# Patient Record
Sex: Female | Born: 1985 | Race: Black or African American | Hispanic: No | Marital: Single | Smoking: Current every day smoker
Health system: Southern US, Community
[De-identification: ages and names within clinical notes are randomized; demographics above are authoritative.]

## PROBLEM LIST (undated history)

## (undated) ENCOUNTER — Inpatient Hospital Stay (HOSPITAL_COMMUNITY): Payer: Self-pay

## (undated) ENCOUNTER — Ambulatory Visit: Source: Home / Self Care

## (undated) DIAGNOSIS — Z789 Other specified health status: Secondary | ICD-10-CM

## (undated) DIAGNOSIS — O24419 Gestational diabetes mellitus in pregnancy, unspecified control: Secondary | ICD-10-CM

## (undated) HISTORY — PX: NO PAST SURGERIES: SHX2092

## (undated) HISTORY — DX: Gestational diabetes mellitus in pregnancy, unspecified control: O24.419

---
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
## 2007-06-11 ENCOUNTER — Emergency Department (HOSPITAL_COMMUNITY): Admission: EM | Admit: 2007-06-11 | Discharge: 2007-06-11 | Payer: Self-pay | Admitting: Emergency Medicine

## 2007-06-28 ENCOUNTER — Emergency Department (HOSPITAL_COMMUNITY): Admission: EM | Admit: 2007-06-28 | Discharge: 2007-06-28 | Payer: Self-pay | Admitting: Emergency Medicine

## 2007-07-13 ENCOUNTER — Inpatient Hospital Stay (HOSPITAL_COMMUNITY): Admission: AD | Admit: 2007-07-13 | Discharge: 2007-07-13 | Payer: Self-pay | Admitting: Obstetrics & Gynecology

## 2007-09-29 ENCOUNTER — Inpatient Hospital Stay (HOSPITAL_COMMUNITY): Admission: AD | Admit: 2007-09-29 | Discharge: 2007-09-30 | Payer: Self-pay | Admitting: Obstetrics

## 2007-10-25 ENCOUNTER — Inpatient Hospital Stay (HOSPITAL_COMMUNITY): Admission: AD | Admit: 2007-10-25 | Discharge: 2007-10-25 | Payer: Self-pay | Admitting: Obstetrics

## 2007-11-07 ENCOUNTER — Inpatient Hospital Stay (HOSPITAL_COMMUNITY): Admission: AD | Admit: 2007-11-07 | Discharge: 2007-11-07 | Payer: Self-pay | Admitting: Obstetrics

## 2007-12-15 ENCOUNTER — Inpatient Hospital Stay (HOSPITAL_COMMUNITY): Admission: AD | Admit: 2007-12-15 | Discharge: 2007-12-15 | Payer: Self-pay | Admitting: Obstetrics

## 2007-12-31 ENCOUNTER — Inpatient Hospital Stay (HOSPITAL_COMMUNITY): Admission: AD | Admit: 2007-12-31 | Discharge: 2007-12-31 | Payer: Self-pay | Admitting: Obstetrics

## 2008-01-09 ENCOUNTER — Inpatient Hospital Stay (HOSPITAL_COMMUNITY): Admission: AD | Admit: 2008-01-09 | Discharge: 2008-01-10 | Payer: Self-pay | Admitting: Obstetrics

## 2008-01-19 ENCOUNTER — Inpatient Hospital Stay (HOSPITAL_COMMUNITY): Admission: AD | Admit: 2008-01-19 | Discharge: 2008-01-21 | Payer: Self-pay | Admitting: Obstetrics

## 2008-04-26 ENCOUNTER — Emergency Department (HOSPITAL_COMMUNITY): Admission: EM | Admit: 2008-04-26 | Discharge: 2008-04-26 | Payer: Self-pay | Admitting: Emergency Medicine

## 2009-07-14 ENCOUNTER — Emergency Department (HOSPITAL_COMMUNITY): Admission: EM | Admit: 2009-07-14 | Discharge: 2009-07-14 | Payer: Self-pay | Admitting: Emergency Medicine

## 2009-08-18 ENCOUNTER — Emergency Department (HOSPITAL_COMMUNITY): Admission: EM | Admit: 2009-08-18 | Discharge: 2009-08-18 | Payer: Self-pay | Admitting: Family Medicine

## 2009-12-24 ENCOUNTER — Emergency Department (HOSPITAL_COMMUNITY): Admission: EM | Admit: 2009-12-24 | Discharge: 2009-12-24 | Payer: Self-pay | Admitting: Emergency Medicine

## 2010-07-17 ENCOUNTER — Emergency Department (HOSPITAL_COMMUNITY): Admission: EM | Admit: 2010-07-17 | Discharge: 2010-07-17 | Payer: Self-pay | Admitting: Emergency Medicine

## 2010-11-05 ENCOUNTER — Inpatient Hospital Stay (INDEPENDENT_AMBULATORY_CARE_PROVIDER_SITE_OTHER)
Admission: RE | Admit: 2010-11-05 | Discharge: 2010-11-05 | Disposition: A | Discharge status: Home / Self Care | Payer: Self-pay | Source: Ambulatory Visit | Attending: Family Medicine | Admitting: Family Medicine

## 2010-11-05 DIAGNOSIS — K089 Disorder of teeth and supporting structures, unspecified: Secondary | ICD-10-CM

## 2010-12-11 LAB — GC/CHLAMYDIA PROBE AMP, GENITAL: GC Probe Amp, Genital: NEGATIVE

## 2010-12-11 LAB — POCT URINALYSIS DIP (DEVICE): Nitrite: NEGATIVE

## 2011-01-12 ENCOUNTER — Inpatient Hospital Stay (INDEPENDENT_AMBULATORY_CARE_PROVIDER_SITE_OTHER)
Admission: RE | Admit: 2011-01-12 | Discharge: 2011-01-12 | Disposition: A | Discharge status: Home / Self Care | Payer: Self-pay | Source: Ambulatory Visit | Attending: Family Medicine | Admitting: Family Medicine

## 2011-01-12 DIAGNOSIS — N760 Acute vaginitis: Secondary | ICD-10-CM

## 2011-01-12 LAB — WET PREP, GENITAL
Clue Cells Wet Prep HPF POC: NONE SEEN
Trich, Wet Prep: NONE SEEN
Yeast Wet Prep HPF POC: NONE SEEN

## 2011-01-15 LAB — GC/CHLAMYDIA PROBE AMP, GENITAL
Chlamydia, DNA Probe: NEGATIVE
GC Probe Amp, Genital: NEGATIVE

## 2011-02-17 ENCOUNTER — Inpatient Hospital Stay (INDEPENDENT_AMBULATORY_CARE_PROVIDER_SITE_OTHER)
Admission: RE | Admit: 2011-02-17 | Discharge: 2011-02-17 | Disposition: A | Discharge status: Home / Self Care | Payer: Medicaid Other | Source: Ambulatory Visit | Attending: Family Medicine | Admitting: Family Medicine

## 2011-02-17 DIAGNOSIS — IMO0002 Reserved for concepts with insufficient information to code with codable children: Secondary | ICD-10-CM

## 2011-05-30 LAB — URINALYSIS, ROUTINE W REFLEX MICROSCOPIC
Hgb urine dipstick: NEGATIVE
Specific Gravity, Urine: 1.02
pH: 6.5

## 2011-05-31 LAB — URINALYSIS, ROUTINE W REFLEX MICROSCOPIC
Bilirubin Urine: NEGATIVE
Hgb urine dipstick: NEGATIVE
Specific Gravity, Urine: <1.005 — ABNORMAL LOW

## 2011-06-04 LAB — URINALYSIS, ROUTINE W REFLEX MICROSCOPIC
Bilirubin Urine: NEGATIVE
Hgb urine dipstick: NEGATIVE
Nitrite: NEGATIVE

## 2011-06-04 LAB — CBC
HCT: 35.4 — ABNORMAL LOW
MCV: 82.8
Platelets: 250
RBC: 4.28
WBC: 13.4 — ABNORMAL HIGH

## 2011-06-04 LAB — URIC ACID: Uric Acid, Serum: 2.6

## 2011-06-04 LAB — COMPREHENSIVE METABOLIC PANEL
AST: 13
Chloride: 105
GFR calc Af Amer: >60
GFR calc non Af Amer: >60
Glucose, Bld: 95
Total Protein: 6.5

## 2011-06-18 LAB — URINALYSIS, ROUTINE W REFLEX MICROSCOPIC
Bilirubin Urine: NEGATIVE
Glucose, UA: NEGATIVE
Ketones, ur: NEGATIVE
Nitrite: NEGATIVE
Protein, ur: NEGATIVE

## 2011-06-18 LAB — URINE MICROSCOPIC-ADD ON: WBC, UA: NONE SEEN

## 2011-06-18 LAB — DIFFERENTIAL
Basophils Relative: 0
Lymphocytes Relative: 17
Monocytes Relative: 4
Neutro Abs: 11.1 — ABNORMAL HIGH
Neutrophils Relative %: 76

## 2011-06-18 LAB — CBC
HCT: 35 — ABNORMAL LOW
Platelets: 297
WBC: 14.6 — ABNORMAL HIGH

## 2011-06-18 LAB — GC/CHLAMYDIA PROBE AMP, GENITAL: GC Probe Amp, Genital: NEGATIVE

## 2011-06-18 LAB — WET PREP, GENITAL: Trich, Wet Prep: NONE SEEN

## 2011-06-19 LAB — RPR: RPR Ser Ql: NONREACTIVE

## 2011-06-19 LAB — GC/CHLAMYDIA PROBE AMP, GENITAL: GC Probe Amp, Genital: NEGATIVE

## 2011-06-19 LAB — ABO/RH: ABO/RH(D): A POS

## 2011-11-05 ENCOUNTER — Other Ambulatory Visit: Payer: Self-pay | Admitting: Obstetrics

## 2011-11-19 ENCOUNTER — Encounter (HOSPITAL_COMMUNITY): Payer: Self-pay | Admitting: Emergency Medicine

## 2011-11-19 ENCOUNTER — Emergency Department (HOSPITAL_COMMUNITY)
Admission: EM | Admit: 2011-11-19 | Discharge: 2011-11-19 | Disposition: A | Discharge status: Home / Self Care | Payer: Medicaid Other | Attending: Emergency Medicine | Admitting: Emergency Medicine

## 2011-11-19 DIAGNOSIS — J069 Acute upper respiratory infection, unspecified: Secondary | ICD-10-CM | POA: Insufficient documentation

## 2011-11-19 DIAGNOSIS — R0981 Nasal congestion: Secondary | ICD-10-CM

## 2011-11-19 MED ORDER — CETIRIZINE-PSEUDOEPHEDRINE ER 5-120 MG PO TB12: 1.0000 | ORAL_TABLET | Freq: Every day | ORAL | Status: DC

## 2011-11-19 MED ORDER — OXYMETAZOLINE HCL 0.05 % NA SOLN
1.0000 | Freq: Once | NASAL | Status: AC
  Administered 2011-11-19: 1 via NASAL
  Filled 2011-11-19: qty 15

## 2011-11-19 NOTE — ED Notes (Signed)
Patient c/o nasal congestion x 1 week, patient states non productive cough.  Patient c/o headache and states her head feels full of congestion and tightness.  Patient states seasonal allergies and has been taking claritin and otc cold medicine for symptoms.

## 2011-11-19 NOTE — ED Notes (Signed)
PT. REPORTS NASAL CONGESTION / RUNNY NOSE FOR 1 WEEK.

## 2011-11-19 NOTE — Discharge Instructions (Signed)
You were seen and evaluated today for your complaints of nasal congestion and headache. At this time your providers feel your symptoms are caused from any concerning infections. You were given a nasal decongestant spray to use to help with your symptoms. Please use this as instructed for no more than 3 days. You've also been given a prescription for additional medications to help with your congestion and runny nose. Please drink plenty of water to stay hydrated and get lots of rest for your body to recover.  Upper Respiratory Infection, Adult An upper respiratory infection (URI) is also known as the common cold. It is often caused by a type of germ (virus). Colds are easily spread (contagious). You can pass it to others by kissing, coughing, sneezing, or drinking out of the same glass. Usually, you get better in 1 or 2 weeks.  HOME CARE   Only take medicine as told by your doctor.   Use a warm mist humidifier or breathe in steam from a hot shower.   Drink enough water and fluids to keep your pee (urine) clear or pale yellow.   Get plenty of rest.   Return to work when your temperature is back to normal or as told by your doctor. You may use a face mask and wash your hands to stop your cold from spreading.  GET HELP RIGHT AWAY IF:   After the first few days, you feel you are getting worse.   You have questions about your medicine.   You have chills, shortness of breath, or brown or red spit (mucus).   You have yellow or brown snot (nasal discharge) or pain in the face, especially when you bend forward.   You have a fever, puffy (swollen) neck, pain when you swallow, or white spots in the back of your throat.   You have a bad headache, ear pain, sinus pain, or chest pain.   You have a high-pitched whistling sound when you breathe in and out (wheezing).   You have a lasting cough or cough up blood.   You have sore muscles or a stiff neck.  MAKE SURE YOU:   Understand these  instructions.   Will watch your condition.   Will get help right away if you are not doing well or get worse.  Document Released: 02/12/2008 Document Revised: 08/15/2011 Document Reviewed: 12/31/2010 [facility] Patient Information 2012 [place], [place].

## 2011-11-19 NOTE — ED Provider Notes (Signed)
History     CSN: [id number]  Arrival date & time 11/19/11  1910   First MD Initiated Contact with Patient 11/19/11 2054      Chief Complaint  Patient presents with  . Nasal Congestion     HPI  History provided by the patient. Patient is a 26 year old female with no significant past medical history who presents with complaints of nasal congestion and rhinorrhea for the past one week. Patient reports also having sinus pressure with headache did become worse today. She's been using over-the-counter Tylenol Sinus without significant improvement. She is not report any other significant symptoms. She denies cough, sore throat, nausea vomiting or diarrhea. She also denies fever chills or sweats. She does not know of any sick contacts. Symptoms are described as moderate. There are no other aggravating or alleviating factors.    History reviewed. No pertinent past medical history.  History reviewed. No pertinent past surgical history.  No family history on file.  History  Substance Use Topics  . Smoking status: Never Smoker   . Smokeless tobacco: Not on file  . Alcohol Use: No    OB History    Grav Para Term Preterm Abortions TAB SAB Ect Mult Living                  Review of Systems  Constitutional: Positive for fatigue. Negative for fever, chills and appetite change.  HENT: Positive for congestion, sore throat, rhinorrhea and sinus pressure. Negative for ear pain.   Respiratory: Negative for cough.   Gastrointestinal: Negative for nausea, vomiting, abdominal pain and diarrhea.  All other systems reviewed and are negative.    Allergies  Review of patient's allergies indicates no known allergies.  Home Medications  No current outpatient prescriptions on file.  BP 126/77  Pulse 95  Temp(Src) 98.2 F (36.8 C) (Oral)  Resp 16  SpO2 100%  Physical Exam  Nursing note and vitals reviewed. Constitutional: She is oriented to person, place, and time. She appears  well-developed and well-nourished. No distress.  HENT:  Head: Normocephalic.  Right Ear: Tympanic membrane normal.  Left Ear: Tympanic membrane normal.  Mouth/Throat: Oropharynx is clear and moist.       Nostrils with edema R>L,  Poor air movement through rt nostril.  Pressure and tenderness over maxillary sinuses.  Neck: Normal range of motion. Neck supple.       No meningeal signs.  Cardiovascular: Normal rate and regular rhythm.   Pulmonary/Chest: Effort normal and breath sounds normal. No respiratory distress. She has no wheezes. She has no rales.  Abdominal: Soft. She exhibits no distension. There is no tenderness. There is no rebound and no guarding.  Lymphadenopathy:    She has no cervical adenopathy.  Neurological: She is alert and oriented to person, place, and time.  Skin: Skin is warm and dry. No rash noted.  Psychiatric: She has a normal mood and affect. Her behavior is normal.    ED Course  Procedures    1. URI (upper respiratory infection)   2. Nasal congestion       MDM  9:40 PM patient seen and evaluated. Patient no acute distress.        Angus Seller, [place] 11/21/11 [phone]

## 2011-11-21 NOTE — ED Provider Notes (Signed)
History/physical exam/procedure(s) were performed by non-physician practitioner and as supervising physician I was immediately available for consultation/collaboration. I have reviewed all notes and am in agreement with care and plan.   Torrin Frein S Christine Schiefelbein, MD 11/21/11 1304 

## 2012-05-27 ENCOUNTER — Encounter (HOSPITAL_COMMUNITY): Payer: Self-pay

## 2012-05-27 ENCOUNTER — Emergency Department (INDEPENDENT_AMBULATORY_CARE_PROVIDER_SITE_OTHER)
Admission: EM | Admit: 2012-05-27 | Discharge: 2012-05-27 | Disposition: A | Discharge status: Home / Self Care | Payer: Self-pay | Source: Home / Self Care | Attending: Family Medicine | Admitting: Family Medicine

## 2012-05-27 DIAGNOSIS — N739 Female pelvic inflammatory disease, unspecified: Secondary | ICD-10-CM

## 2012-05-27 LAB — POCT URINALYSIS DIP (DEVICE)
Bilirubin Urine: NEGATIVE
Glucose, UA: NEGATIVE mg/dL
Ketones, ur: NEGATIVE mg/dL
Leukocytes, UA: NEGATIVE
Nitrite: NEGATIVE
pH: 6 (ref 5.0–8.0)

## 2012-05-27 LAB — WET PREP, GENITAL: Yeast Wet Prep HPF POC: NONE SEEN

## 2012-05-27 MED ORDER — CEFTRIAXONE SODIUM 1 G IJ SOLR
1.0000 g | Freq: Once | INTRAMUSCULAR | Status: AC
  Administered 2012-05-27: 1 g via INTRAMUSCULAR

## 2012-05-27 MED ORDER — AZITHROMYCIN 250 MG PO TABS
1000.0000 mg | ORAL_TABLET | Freq: Once | ORAL | Status: AC
  Administered 2012-05-27: 1000 mg via ORAL

## 2012-05-27 MED ORDER — METRONIDAZOLE 500 MG PO TABS: 500.0000 mg | ORAL_TABLET | Freq: Two times a day (BID) | ORAL | Status: DC

## 2012-05-27 MED ORDER — AZITHROMYCIN 250 MG PO TABS
ORAL_TABLET | ORAL | Status: AC
  Filled 2012-05-27: qty 4

## 2012-05-27 MED ORDER — LIDOCAINE HCL (PF) 1 % IJ SOLN
INTRAMUSCULAR | Status: AC
  Filled 2012-05-27: qty 5

## 2012-05-27 MED ORDER — CEFTRIAXONE SODIUM 250 MG IJ SOLR
INTRAMUSCULAR | Status: AC
  Filled 2012-05-27: qty 250

## 2012-05-27 NOTE — ED Provider Notes (Signed)
History     CSN: [id number]  Arrival date & time 05/27/12  1803   First MD Initiated Contact with Patient 05/27/12 1829      Chief Complaint  Patient presents with  . Abdominal Pain    (Consider location/radiation/quality/duration/timing/severity/associated sxs/prior treatment) Patient is a 26 y.o. female presenting with vaginal discharge. The history is provided by the patient.  Vaginal Discharge This is a new problem. The current episode started 2 days ago. The problem has been gradually worsening. Associated symptoms include abdominal pain.    History reviewed. No pertinent past medical history.  History reviewed. No pertinent past surgical history.  History reviewed. No pertinent family history.  History  Substance Use Topics  . Smoking status: Never Smoker   . Smokeless tobacco: Not on file  . Alcohol Use: No    OB History    Grav Para Term Preterm Abortions TAB SAB Ect Mult Living                  Review of Systems  Constitutional: Negative.   Gastrointestinal: Positive for abdominal pain. Negative for nausea, vomiting, diarrhea and constipation.  Genitourinary: Positive for vaginal discharge. Negative for dysuria, urgency, vaginal bleeding, vaginal pain and dyspareunia.    Allergies  Review of patient's allergies indicates no known allergies.  Home Medications   Current Outpatient Rx  Name Route Sig Dispense Refill  . CETIRIZINE-PSEUDOEPHEDRINE ER 5-120 MG PO TB12 Oral Take 1 tablet by mouth daily. 30 tablet 0  . METRONIDAZOLE 500 MG PO TABS Oral Take 1 tablet (500 mg total) by mouth 2 (two) times daily. 14 tablet 0    BP 137/79  Pulse 101  Temp 97.9 F (36.6 C) (Oral)  Resp 18  SpO2 97%  Physical Exam  Nursing note and vitals reviewed. Constitutional: She appears well-developed and well-nourished.  Abdominal: Soft. Bowel sounds are normal. She exhibits no distension and no mass. There is no hepatosplenomegaly. There is tenderness in the right  lower quadrant and suprapubic area. There is no rigidity, no rebound, no guarding, no CVA tenderness, no tenderness at McBurney's point and negative Murphy's sign.  Genitourinary: Uterus is tender. Cervix exhibits motion tenderness and discharge. Right adnexum displays tenderness. Vaginal discharge found.    ED Course  Procedures (including critical care time)  Labs Reviewed  POCT URINALYSIS DIP (DEVICE) - Abnormal; Notable for the following:    Hgb urine dipstick SMALL (*)     Protein, ur 30 (*)     Urobilinogen, UA 4.0 (*)     All other components within normal limits  POCT PREGNANCY, URINE  GC/CHLAMYDIA PROBE AMP, GENITAL  WET PREP, GENITAL   No results found.   1. PID (pelvic inflammatory disease)       MDM          Linna Hoff, MD 05/27/12 [phone]

## 2012-05-27 NOTE — ED Notes (Signed)
Reports lower abdominal area pain, vaginal d/c x 2 days; NAD; overdue for depo x 2 months

## 2012-05-27 NOTE — ED Notes (Signed)
Mediation compliance reviewed ; call back number for lab issues verified

## 2012-05-28 LAB — GC/CHLAMYDIA PROBE AMP, GENITAL
Chlamydia, DNA Probe: NEGATIVE
GC Probe Amp, Genital: POSITIVE — AB

## 2012-05-29 ENCOUNTER — Telehealth (HOSPITAL_COMMUNITY): Payer: Self-pay | Admitting: *Deleted

## 2012-05-29 NOTE — ED Notes (Signed)
Pt. called for her lab results.  Pt. verified x 2 and given results.  (GC pos., Chlamydia neg., Wet prep: WBC's TNTC) Pt. told she was adequately treated with Rocephin and Zithromax.  Pt. instructed to notify her partner, no sex for 1 week and to practice safe sex. Pt. told she can get HIV testing at the [facility]. STD clinic, by appointment. Vassie Moselle 05/29/2012

## 2012-06-29 ENCOUNTER — Emergency Department (HOSPITAL_COMMUNITY)
Admission: EM | Admit: 2012-06-29 | Discharge: 2012-06-29 | Disposition: A | Discharge status: Home / Self Care | Payer: Medicaid Other | Attending: Emergency Medicine | Admitting: Emergency Medicine

## 2012-06-29 ENCOUNTER — Encounter (HOSPITAL_COMMUNITY): Payer: Self-pay

## 2012-06-29 DIAGNOSIS — R21 Rash and other nonspecific skin eruption: Secondary | ICD-10-CM | POA: Insufficient documentation

## 2012-06-29 DIAGNOSIS — F17200 Nicotine dependence, unspecified, uncomplicated: Secondary | ICD-10-CM | POA: Insufficient documentation

## 2012-06-29 MED ORDER — PERMETHRIN 5 % EX CREA: TOPICAL_CREAM | CUTANEOUS | Status: DC

## 2012-06-29 NOTE — ED Provider Notes (Signed)
History   Scribed for Gerhard Munch, MD, the patient was seen in room TR11C/TR11C . This chart was scribed by Lewanda Rife.   CSN: [id number]  Arrival date & time 06/29/12  1706   First MD Initiated Contact with Patient 06/29/12 1827      Chief Complaint  Patient presents with  . Rash    (Consider location/radiation/quality/duration/timing/severity/associated sxs/prior treatment) HPI Lorraine Nguyen is a 26 y.o. female who presents to the Emergency Department complaining of a moderately pruritic rash on arms, back and legs for the past 3 weeks. Pt describes her symptoms as constant and worse at night. Pt reports her 105 year old son might have been exposed to scabies at day care. Pt denies chest pain, fever, vomiting, and confusion. Pt reports using hydrocortisone with mild relief of symptoms. Pt denies any significant past medical history.     History reviewed. No pertinent past medical history.  History reviewed. No pertinent past surgical history.  History reviewed. No pertinent family history.  History  Substance Use Topics  . Smoking status: Current Every Day Smoker  . Smokeless tobacco: Not on file  . Alcohol Use: Yes    OB History    Grav Para Term Preterm Abortions TAB SAB Ect Mult Living                  Review of Systems  Constitutional: Negative.   HENT: Negative.   Respiratory: Negative.   Cardiovascular: Negative.   Gastrointestinal: Negative.   Musculoskeletal: Negative.   Skin: Positive for rash.  Neurological: Negative.   Hematological: Negative.   Psychiatric/Behavioral: Negative.     Allergies  Review of patient's allergies indicates no known allergies.  Home Medications  No current outpatient prescriptions on file.  BP 125/81  Pulse 97  Temp 97.5 F (36.4 C) (Oral)  Resp 18  SpO2 99%  Physical Exam  Nursing note and vitals reviewed. Constitutional: She is oriented to person, place, and time. She appears well-developed  and well-nourished. No distress.  HENT:  Head: Normocephalic and atraumatic.       No mucosal involvement  Eyes: Conjunctivae normal and EOM are normal.  Pulmonary/Chest: No stridor. No respiratory distress.  Abdominal: She exhibits no distension.  Musculoskeletal: She exhibits no edema.  Neurological: She is alert and oriented to person, place, and time. No cranial nerve deficit.  Skin: Skin is warm and dry. Rash noted.       Diffuse widely spaced raised patches of rashes No erythema    Psychiatric: She has a normal mood and affect.    ED Course  Procedures (including critical care time)  Labs Reviewed - No data to display No results found.   No diagnosis found.    MDM  I personally performed the services described in this documentation, which was scribed in my presence. The recorded information has been reviewed and considered.  This young female presents to the emergency department accompanied by her son who has similar lesions.  On my exams he is in no distress.  Given the presence of lesions on multiple family members there is suspicion of a vector based etiology, such as mites or fleas.  The absence of distress, fever, tachycardia, and the patient's otherwise generally benign presentation areeassuring.  I discussed all pertinent findings with the patient.  She was discharged in stable condition.  Gerhard Munch, MD 06/30/12 [phone]

## 2012-06-29 NOTE — ED Notes (Signed)
Pt with c/o rash on entire body for approx 3 weeks

## 2013-01-25 ENCOUNTER — Emergency Department (INDEPENDENT_AMBULATORY_CARE_PROVIDER_SITE_OTHER)
Admission: EM | Admit: 2013-01-25 | Discharge: 2013-01-25 | Disposition: A | Discharge status: Home / Self Care | Payer: Medicaid Other | Source: Home / Self Care

## 2013-01-25 ENCOUNTER — Encounter (HOSPITAL_COMMUNITY): Payer: Self-pay | Admitting: Emergency Medicine

## 2013-01-25 DIAGNOSIS — B356 Tinea cruris: Secondary | ICD-10-CM

## 2013-01-25 MED ORDER — CLOTRIMAZOLE-BETAMETHASONE 1-0.05 % EX CREA: TOPICAL_CREAM | CUTANEOUS | Status: DC

## 2013-01-25 NOTE — ED Provider Notes (Signed)
History     CSN: [id number]  Arrival date & time 01/25/13  1132   First MD Initiated Contact with Patient 01/25/13 1230      Chief Complaint  Patient presents with  . Rash    (Consider location/radiation/quality/duration/timing/severity/associated sxs/prior treatment) HPI Comments: 27 year old obese female complaining of a rash for 3 weeks or longer. The itching rashes located in the inguinal folds. This is been coming and going over the past few months. It got worse after applying a feminine spray   History reviewed. No pertinent past medical history.  History reviewed. No pertinent past surgical history.  No family history on file.  History  Substance Use Topics  . Smoking status: Never Smoker   . Smokeless tobacco: Not on file  . Alcohol Use: No    OB History   Grav Para Term Preterm Abortions TAB SAB Ect Mult Living                  Review of Systems  Skin: Positive for rash.       As above  All other systems reviewed and are negative.    Allergies  Review of patient's allergies indicates no known allergies.  Home Medications   Current Outpatient Rx  Name  Route  Sig  Dispense  Refill  . hydrocortisone cream 1 %   Topical   Apply topically 2 (two) times daily.         . clotrimazole-betamethasone (LOTRISONE) cream      Apply to affected area 2 times daily prn   30 g   0   . permethrin (ELIMITE) 5 % cream      Apply to affected area once   60 g   0     BP 135/75  Pulse 94  Temp(Src) 98.4 F (36.9 C) (Oral)  Resp 14  SpO2 100%  Physical Exam  Nursing note and vitals reviewed. Constitutional: She is oriented to person, place, and time. She appears well-developed and well-nourished. No distress.  Eyes: EOM are normal.  Neck: Normal range of motion. Neck supple.  Pulmonary/Chest: Effort normal. No respiratory distress.  Musculoskeletal: She exhibits no edema and no tenderness.  Neurological: She is alert and oriented to person,  place, and time. She exhibits normal muscle tone.  Skin: Skin is warm and dry. Rash noted.  Well marginated slightly brownish to red , scaly rash along the proximal medial thighs, inguinal folds    ED Course  Procedures (including critical care time)  Labs Reviewed - No data to display No results found.   1. Tinea cruris       MDM  Lotrisone cream apply twice a day to affected area. Continue to apply the cream 5 days after it appears to have abated. If you run out he may purchase Lamisil or Lotrimin AF cream's to apply to the area to kill the fungus. Keep area dry        Hayden Rasmussen, NP 01/25/13 1247

## 2013-01-25 NOTE — ED Provider Notes (Signed)
Medical screening examination/treatment/procedure(s) were performed by non-physician practitioner and as supervising physician I was immediately available for consultation/collaboration.   MORENO-COLL,Kenya Kook; MD  Kathyann Spaugh Moreno-Coll, MD 01/25/13 1658 

## 2013-01-25 NOTE — ED Notes (Signed)
Rash in groin area, no histroy of the same, rash has been present for 3 weeks.  Reports rash itches, but has not worsened over the time frame

## 2013-08-02 ENCOUNTER — Emergency Department (INDEPENDENT_AMBULATORY_CARE_PROVIDER_SITE_OTHER)
Admission: EM | Admit: 2013-08-02 | Discharge: 2013-08-02 | Disposition: A | Discharge status: Home / Self Care | Payer: Medicaid Other | Source: Home / Self Care | Attending: Family Medicine | Admitting: Family Medicine

## 2013-08-02 ENCOUNTER — Encounter (HOSPITAL_COMMUNITY): Payer: Self-pay | Admitting: Emergency Medicine

## 2013-08-02 DIAGNOSIS — M62830 Muscle spasm of back: Secondary | ICD-10-CM

## 2013-08-02 DIAGNOSIS — M549 Dorsalgia, unspecified: Secondary | ICD-10-CM

## 2013-08-02 DIAGNOSIS — K047 Periapical abscess without sinus: Secondary | ICD-10-CM

## 2013-08-02 DIAGNOSIS — K044 Acute apical periodontitis of pulpal origin: Secondary | ICD-10-CM

## 2013-08-02 DIAGNOSIS — M5380 Other specified dorsopathies, site unspecified: Secondary | ICD-10-CM

## 2013-08-02 MED ORDER — TRAMADOL HCL 50 MG PO TABS: 50.0000 mg | ORAL_TABLET | Freq: Four times a day (QID) | ORAL | Status: DC | PRN

## 2013-08-02 MED ORDER — CLINDAMYCIN HCL 300 MG PO CAPS: 300.0000 mg | ORAL_CAPSULE | Freq: Three times a day (TID) | ORAL | Status: DC

## 2013-08-02 MED ORDER — CYCLOBENZAPRINE HCL 5 MG PO TABS: 5.0000 mg | ORAL_TABLET | Freq: Three times a day (TID) | ORAL | Status: DC | PRN

## 2013-08-02 NOTE — ED Notes (Signed)
Patient has multiple complaints.  Reports mid back pain onset 2 weeks ago, no known injury.  Toothache for 4 days.  Pain upper right, soreness under jaw

## 2013-08-02 NOTE — ED Provider Notes (Signed)
Medical screening examination/treatment/procedure(s) were performed by a resident physician or non-physician practitioner and as the supervising physician I was immediately available for consultation/collaboration.  Mickle Campton, MD      Jocob Dambach S Sangita Zani, MD 08/02/13 2113 

## 2013-08-02 NOTE — ED Notes (Signed)
Patient being seen with child, child is a patient , too

## 2013-08-02 NOTE — ED Provider Notes (Signed)
CSN: [id number]     Arrival date & time 08/02/13  1617 History   First MD Initiated Contact with Patient 08/02/13 1636     Chief Complaint  Patient presents with  . Dental Pain  . Back Pain   (Consider location/radiation/quality/duration/timing/severity/associated sxs/prior Treatment) Patient is a 27 y.o. female presenting with tooth pain and back pain.  Dental Pain Associated symptoms: no fever   Back Pain Associated symptoms: no chest pain and no fever     Dental pain: off an on. Unable to get it pulled due to insurance. Current episode started 2 days ago. Has not tried anytihg other than warm water. Tylenol w/o benefit. Gum swelling. Improving. Located in R upper an lower jaw. Reports purulent and bloody discharge. Associated w/ HA. Denies fevers, n/v. Reports numerous dental cavities  Back pain: started 3 wks ago. Uses girdle w/ benefit. Located in upper back. Slow onset. No radiation. H/o low back pain. Difficulty getting out of bed in morning. Denies dysuria, abd pain, loss of bowel or bladder function or falls. H/o fall down stairs about 1 year ago. Decrease in daily exercise over last month.   History reviewed. No pertinent past medical history. History reviewed. No pertinent past surgical history. No family history on file. History  Substance Use Topics  . Smoking status: Never Smoker   . Smokeless tobacco: Not on file  . Alcohol Use: No   OB History   Grav Para Term Preterm Abortions TAB SAB Ect Mult Living                 Review of Systems  Constitutional: Negative for fever, activity change, appetite change and unexpected weight change.  Respiratory: Negative for shortness of breath.   Cardiovascular: Negative for chest pain.  Musculoskeletal: Positive for back pain.  All other systems reviewed and are negative.    Allergies  Review of patient's allergies indicates no known allergies.  Home Medications   Current Outpatient Rx  Name  Route  Sig  Dispense   Refill  . clindamycin (CLEOCIN) 300 MG capsule   Oral   Take 1 capsule (300 mg total) by mouth 3 (three) times daily.   30 capsule   0   . clotrimazole-betamethasone (LOTRISONE) cream      Apply to affected area 2 times daily prn   30 g   0   . cyclobenzaprine (FLEXERIL) 5 MG tablet   Oral   Take 1 tablet (5 mg total) by mouth 3 (three) times daily as needed for muscle spasms.   30 tablet   0   . hydrocortisone cream 1 %   Topical   Apply topically 2 (two) times daily.         . permethrin (ELIMITE) 5 % cream      Apply to affected area once   60 g   0   . traMADol (ULTRAM) 50 MG tablet   Oral   Take 1 tablet (50 mg total) by mouth every 6 (six) hours as needed.   15 tablet   0    BP 115/80  Pulse 70  Temp(Src) 98.9 F (37.2 C) (Oral)  Resp 16  SpO2 100% Physical Exam  Constitutional: She is oriented to person, place, and time. She appears well-developed and well-nourished. No distress.  HENT:  Head: Normocephalic and atraumatic.  Numerous dental carries and teeth in varying degrees of decay. Foul odor w/ perigingival erythema. No obvious discharge.   Eyes: Pupils are equal, round, and  reactive to light.  Neck: Normal range of motion.  Cardiovascular: Normal rate and normal heart sounds.   Pulmonary/Chest: Effort normal and breath sounds normal.  Abdominal: Soft. Bowel sounds are normal.  Musculoskeletal: Normal range of motion.  Mild perispinal mid thoracic muscle tightness and ttp.   Neurological: She is alert and oriented to person, place, and time.  Skin: Skin is warm.  Psychiatric: She has a normal mood and affect. Her behavior is normal. Judgment and thought content normal.    ED Course  Procedures (including critical care time) Labs Review Labs Reviewed - No data to display Imaging Review No results found.  EKG Interpretation    Date/Time:    Ventricular Rate:    PR Interval:    QRS Duration:   QT Interval:    QTC Calculation:   R  Axis:     Text Interpretation:              MDM   1. Dental infection   2. Back pain   3. Back spasm    Dental pain: numerous episodes of infection in past w/ 2 likely sources at this time on R mandible and Maxilla. Reiterated importance of dental appt in near future for extraction - Clinda 300 Q8 x 10 days - tramadol  Back spasm: likely all soft tissue pain.  Flexeril - tramadol - Stretches and exercises - continue girdle  All questions answered and precautions given  Shelly Flatten, MD Family Medicine PGY-3 08/02/2013, 6:04 PM      Ozella Rocks, MD 08/02/13 [phone]

## 2014-11-13 ENCOUNTER — Encounter (HOSPITAL_COMMUNITY): Payer: Self-pay | Admitting: Emergency Medicine

## 2014-11-13 ENCOUNTER — Emergency Department (HOSPITAL_COMMUNITY)
Admission: EM | Admit: 2014-11-13 | Discharge: 2014-11-13 | Disposition: A | Discharge status: Home / Self Care | Payer: Medicaid Other | Attending: Emergency Medicine | Admitting: Emergency Medicine

## 2014-11-13 DIAGNOSIS — Y92410 Unspecified street and highway as the place of occurrence of the external cause: Secondary | ICD-10-CM | POA: Diagnosis not present

## 2014-11-13 DIAGNOSIS — Y998 Other external cause status: Secondary | ICD-10-CM | POA: Insufficient documentation

## 2014-11-13 DIAGNOSIS — S46912A Strain of unspecified muscle, fascia and tendon at shoulder and upper arm level, left arm, initial encounter: Secondary | ICD-10-CM | POA: Diagnosis not present

## 2014-11-13 DIAGNOSIS — Z720 Tobacco use: Secondary | ICD-10-CM | POA: Diagnosis not present

## 2014-11-13 DIAGNOSIS — Y9389 Activity, other specified: Secondary | ICD-10-CM | POA: Insufficient documentation

## 2014-11-13 DIAGNOSIS — S3992XA Unspecified injury of lower back, initial encounter: Secondary | ICD-10-CM | POA: Insufficient documentation

## 2014-11-13 DIAGNOSIS — T148XXA Other injury of unspecified body region, initial encounter: Secondary | ICD-10-CM

## 2014-11-13 DIAGNOSIS — S4992XA Unspecified injury of left shoulder and upper arm, initial encounter: Secondary | ICD-10-CM | POA: Diagnosis present

## 2014-11-13 MED ORDER — CYCLOBENZAPRINE HCL 10 MG PO TABS
10.0000 mg | ORAL_TABLET | Freq: Once | ORAL | Status: AC
  Administered 2014-11-13: 10 mg via ORAL
  Filled 2014-11-13: qty 1

## 2014-11-13 MED ORDER — CYCLOBENZAPRINE HCL 10 MG PO TABS: 10.0000 mg | ORAL_TABLET | Freq: Two times a day (BID) | ORAL | Status: DC | PRN

## 2014-11-13 MED ORDER — IBUPROFEN 800 MG PO TABS: 800.0000 mg | ORAL_TABLET | Freq: Three times a day (TID) | ORAL | Status: DC

## 2014-11-13 MED ORDER — IBUPROFEN 800 MG PO TABS
800.0000 mg | ORAL_TABLET | Freq: Once | ORAL | Status: AC
  Administered 2014-11-13: 800 mg via ORAL
  Filled 2014-11-13: qty 1
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
## 2014-11-13 NOTE — ED Notes (Signed)
Patient c/o left shoulder pain, neck pain, headache, and feeling light headed.

## 2014-11-13 NOTE — ED Notes (Signed)
Awake. Verbally responsive. A/O x4. Resp even and unlabored. No audible adventitious breath sounds noted. ABC's intact.  
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
## 2014-11-13 NOTE — ED Notes (Signed)
Resting quietly with eye closed. Easily arousable. Verbally responsive. Resp even and unlabored. ABC's intact.   
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
## 2015-01-07 ENCOUNTER — Encounter (HOSPITAL_COMMUNITY): Payer: Self-pay

## 2015-01-07 ENCOUNTER — Inpatient Hospital Stay (HOSPITAL_COMMUNITY)
Admission: AD | Admit: 2015-01-07 | Discharge: 2015-01-07 | Disposition: A | Discharge status: Home / Self Care | Payer: Medicaid Other | Source: Ambulatory Visit | Attending: Family Medicine | Admitting: Family Medicine

## 2015-01-07 DIAGNOSIS — R109 Unspecified abdominal pain: Secondary | ICD-10-CM | POA: Insufficient documentation

## 2015-01-07 DIAGNOSIS — F17210 Nicotine dependence, cigarettes, uncomplicated: Secondary | ICD-10-CM | POA: Diagnosis not present

## 2015-01-07 DIAGNOSIS — A5901 Trichomonal vulvovaginitis: Secondary | ICD-10-CM | POA: Diagnosis not present

## 2015-01-07 LAB — WET PREP, GENITAL: Yeast Wet Prep HPF POC: NONE SEEN

## 2015-01-07 LAB — URINALYSIS, ROUTINE W REFLEX MICROSCOPIC
Bilirubin Urine: NEGATIVE
Glucose, UA: NEGATIVE mg/dL
KETONES UR: NEGATIVE mg/dL
LEUKOCYTES UA: NEGATIVE
NITRITE: NEGATIVE
PH: 6 (ref 5.0–8.0)
Protein, ur: NEGATIVE mg/dL
Urobilinogen, UA: 1 mg/dL (ref 0.0–1.0)

## 2015-01-07 LAB — POCT PREGNANCY, URINE: Preg Test, Ur: NEGATIVE

## 2015-01-07 LAB — URINE MICROSCOPIC-ADD ON

## 2015-01-07 MED ORDER — METRONIDAZOLE 500 MG PO TABS
2000.0000 mg | ORAL_TABLET | Freq: Once | ORAL | Status: AC
  Administered 2015-01-07: 2000 mg via ORAL
  Filled 2015-01-07: qty 4

## 2015-01-07 NOTE — MAU Provider Note (Signed)
History     CSN: [id number]  Arrival date and time: 01/07/15 1103   First Provider Initiated Contact with Patient 01/07/15 1127      Chief Complaint  Patient presents with  . Possible Pregnancy   HPI 29 y.o. [id number] with no period x 2 months. States that about 2 weeks ago she had dark spotting x 3 days, cramping around that time, no other bleeding since February, no pain or bleeding now. Was previously on DMPA, last injection in November 2015. Pt states she is not having intercourse, last intercourse in February.   History reviewed. No pertinent past medical history.  History reviewed. No pertinent past surgical history.  History reviewed. No pertinent family history.  History  Substance Use Topics  . Smoking status: Current Every Day Smoker -- 0.25 packs/day    Types: Cigarettes  . Smokeless tobacco: Not on file  . Alcohol Use: No     Comment: socially     Allergies: No Known Allergies  Prescriptions prior to admission  Medication Sig Dispense Refill Last Dose  . cyclobenzaprine (FLEXERIL) 10 MG tablet Take 1 tablet (10 mg total) by mouth 2 (two) times daily as needed for muscle spasms. 20 tablet 0 Past Month at Unknown time  . ibuprofen (ADVIL,MOTRIN) 800 MG tablet Take 1 tablet (800 mg total) by mouth 3 (three) times daily. (Patient taking differently: Take 800 mg by mouth as needed for moderate pain. ) 21 tablet 0 Past Month at Unknown time  . clindamycin (CLEOCIN) 300 MG capsule Take 1 capsule (300 mg total) by mouth 3 (three) times daily. (Patient not taking: Reported on 11/13/2014) 30 capsule 0 Not Taking at Unknown time  . clotrimazole-betamethasone (LOTRISONE) cream Apply to affected area 2 times daily prn (Patient not taking: Reported on 11/13/2014) 30 g 0 Not Taking at Unknown time  . permethrin (ELIMITE) 5 % cream Apply to affected area once (Patient not taking: Reported on 11/13/2014) 60 g 0 Not Taking at Unknown time  . traMADol (ULTRAM) 50 MG tablet Take 1 tablet (50  mg total) by mouth every 6 (six) hours as needed. (Patient not taking: Reported on 11/13/2014) 15 tablet 0 Not Taking at Unknown time    Review of Systems  Constitutional: Negative.   Respiratory: Negative.   Cardiovascular: Negative.   Gastrointestinal: Negative for nausea, vomiting, abdominal pain, diarrhea and constipation.  Genitourinary: Negative for dysuria, urgency, frequency, hematuria and flank pain.       + amenorrhea, discharge  Musculoskeletal: Negative.   Neurological: Negative.   Psychiatric/Behavioral: Negative.    Physical Exam   Blood pressure 149/83, pulse 97, temperature 98 F (36.7 C), resp. rate 16, height 6' (1.829 m), weight 252 lb (114.306 kg), last menstrual period 10/22/2014.  Physical Exam  Constitutional: She is oriented to person, place, and time. She appears well-developed and well-nourished. No distress.  HENT:  Head: Normocephalic and atraumatic.  Cardiovascular: Normal rate, regular rhythm and normal heart sounds.   Respiratory: Effort normal and breath sounds normal. No respiratory distress.  GI: Soft. Bowel sounds are normal. She exhibits no distension and no mass. There is no tenderness. There is no rebound and no guarding.  Genitourinary: There is no rash or lesion on the right labia. There is no rash or lesion on the left labia. Uterus is not deviated, not enlarged, not fixed and not tender. Cervix exhibits no motion tenderness, no discharge and no friability. Right adnexum displays no mass, no tenderness and no fullness. Left adnexum displays  no mass, no tenderness and no fullness. No erythema, tenderness or bleeding in the vagina. Vaginal discharge (white, odor) found.  Neurological: She is alert and oriented to person, place, and time.  Skin: Skin is warm and dry.  Psychiatric: She has a normal mood and affect.    MAU Course  Procedures Results for orders placed or performed during the hospital encounter of 01/07/15 (from the past 24 hour(s))   Urinalysis, Routine w reflex microscopic     Status: Abnormal   Collection Time: 01/07/15 11:10 AM  Result Value Ref Range   Color, Urine YELLOW YELLOW   APPearance CLEAR CLEAR   Specific Gravity, Urine >1.030 (H) 1.005 - 1.030   pH 6.0 5.0 - 8.0   Glucose, UA NEGATIVE NEGATIVE mg/dL   Hgb urine dipstick SMALL (A) NEGATIVE   Bilirubin Urine NEGATIVE NEGATIVE   Ketones, ur NEGATIVE NEGATIVE mg/dL   Protein, ur NEGATIVE NEGATIVE mg/dL   Urobilinogen, UA 1.0 0.0 - 1.0 mg/dL   Nitrite NEGATIVE NEGATIVE   Leukocytes, UA NEGATIVE NEGATIVE  Urine microscopic-add on     Status: None   Collection Time: 01/07/15 11:10 AM  Result Value Ref Range   Squamous Epithelial / LPF RARE RARE   WBC, UA 0-2 <3 WBC/hpf   RBC / HPF 0-2 <3 RBC/hpf   Bacteria, UA RARE RARE   Urine-Other MUCOUS PRESENT   Pregnancy, urine POC     Status: None   Collection Time: 01/07/15 11:22 AM  Result Value Ref Range   Preg Test, Ur NEGATIVE NEGATIVE  Wet prep, genital     Status: Abnormal   Collection Time: 01/07/15 11:30 AM  Result Value Ref Range   Yeast Wet Prep HPF POC NONE SEEN NONE SEEN   Trich, Wet Prep FEW (A) NONE SEEN   Clue Cells Wet Prep HPF POC FEW (A) NONE SEEN   WBC, Wet Prep HPF POC FEW (A) NONE SEEN     Assessment and Plan   1. Trichomonal vaginitis   Treated w/ Flagyl 2000 mg PO x 1 in MAU today, rev'd no alcohol, partner treatment, no intercourse x 7 days following treatment Follow up outpatient if no menses x more than 3 months, discussed delay in return to normal cycles after stopping DMPA    Medication List    STOP taking these medications        clindamycin 300 MG capsule  Commonly known as:  CLEOCIN     permethrin 5 % cream  Commonly known as:  ELIMITE      TAKE these medications        clotrimazole-betamethasone cream  Commonly known as:  LOTRISONE  Apply to affected area 2 times daily prn     cyclobenzaprine 10 MG tablet  Commonly known as:  FLEXERIL  Take 1 tablet  (10 mg total) by mouth 2 (two) times daily as needed for muscle spasms.     ibuprofen 800 MG tablet  Commonly known as:  ADVIL,MOTRIN  Take 1 tablet (800 mg total) by mouth 3 (three) times daily.     traMADol 50 MG tablet  Commonly known as:  ULTRAM  Take 1 tablet (50 mg total) by mouth every 6 (six) hours as needed.        Follow-up Information    Follow up with [facility]   Specialty:  Obstetrics and Gynecology   Why:  As needed   Contact information:   [address] [place] [address] [phone]  FRAZIER,NATALIE 01/07/2015, 12:16 PM

## 2015-01-07 NOTE — MAU Note (Signed)
Pt presents to MAU with complaints of pain in her lower abdomen. Reports some brown discharge when she wipes. Has missed 2 menstrual cycles

## 2015-01-07 NOTE — Discharge Instructions (Signed)

## 2015-01-08 LAB — HIV ANTIBODY (ROUTINE TESTING W REFLEX): HIV SCREEN 4TH GENERATION: NONREACTIVE

## 2015-01-09 LAB — GC/CHLAMYDIA PROBE AMP (~~LOC~~) NOT AT ARMC
Chlamydia: NEGATIVE
Neisseria Gonorrhea: NEGATIVE

## 2015-02-17 ENCOUNTER — Emergency Department (HOSPITAL_COMMUNITY)
Admission: EM | Admit: 2015-02-17 | Discharge: 2015-02-18 | Disposition: A | Discharge status: Home / Self Care | Payer: Medicaid Other | Attending: Emergency Medicine | Admitting: Emergency Medicine

## 2015-02-17 ENCOUNTER — Encounter (HOSPITAL_COMMUNITY): Payer: Self-pay | Admitting: *Deleted

## 2015-02-17 DIAGNOSIS — Y998 Other external cause status: Secondary | ICD-10-CM | POA: Insufficient documentation

## 2015-02-17 DIAGNOSIS — Y92410 Unspecified street and highway as the place of occurrence of the external cause: Secondary | ICD-10-CM | POA: Insufficient documentation

## 2015-02-17 DIAGNOSIS — R000 Tachycardia, unspecified: Secondary | ICD-10-CM | POA: Diagnosis not present

## 2015-02-17 DIAGNOSIS — Z720 Tobacco use: Secondary | ICD-10-CM | POA: Insufficient documentation

## 2015-02-17 DIAGNOSIS — R51 Headache: Secondary | ICD-10-CM

## 2015-02-17 DIAGNOSIS — T148XXA Other injury of unspecified body region, initial encounter: Secondary | ICD-10-CM

## 2015-02-17 DIAGNOSIS — S0990XA Unspecified injury of head, initial encounter: Secondary | ICD-10-CM | POA: Insufficient documentation

## 2015-02-17 DIAGNOSIS — R519 Headache, unspecified: Secondary | ICD-10-CM

## 2015-02-17 DIAGNOSIS — S59912A Unspecified injury of left forearm, initial encounter: Secondary | ICD-10-CM | POA: Diagnosis not present

## 2015-02-17 DIAGNOSIS — S40812A Abrasion of left upper arm, initial encounter: Secondary | ICD-10-CM | POA: Insufficient documentation

## 2015-02-17 DIAGNOSIS — S4992XA Unspecified injury of left shoulder and upper arm, initial encounter: Secondary | ICD-10-CM | POA: Insufficient documentation

## 2015-02-17 DIAGNOSIS — Y9389 Activity, other specified: Secondary | ICD-10-CM | POA: Insufficient documentation

## 2015-02-17 DIAGNOSIS — S199XXA Unspecified injury of neck, initial encounter: Secondary | ICD-10-CM | POA: Insufficient documentation

## 2015-02-17 DIAGNOSIS — R42 Dizziness and giddiness: Secondary | ICD-10-CM | POA: Insufficient documentation

## 2015-02-17 DIAGNOSIS — T1490XA Injury, unspecified, initial encounter: Secondary | ICD-10-CM

## 2015-02-17 DIAGNOSIS — T148 Other injury of unspecified body region: Secondary | ICD-10-CM | POA: Diagnosis not present

## 2015-02-17 DIAGNOSIS — S59902A Unspecified injury of left elbow, initial encounter: Secondary | ICD-10-CM | POA: Insufficient documentation

## 2015-02-17 DIAGNOSIS — S80812A Abrasion, left lower leg, initial encounter: Secondary | ICD-10-CM | POA: Insufficient documentation
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
## 2015-02-18 ENCOUNTER — Emergency Department (HOSPITAL_COMMUNITY): Payer: Medicaid Other

## 2015-02-18 MED ORDER — HYDROCODONE-ACETAMINOPHEN 5-325 MG PO TABS: 1.0000 | ORAL_TABLET | Freq: Four times a day (QID) | ORAL | Status: DC | PRN

## 2015-02-18 MED ORDER — IBUPROFEN 400 MG PO TABS
600.0000 mg | ORAL_TABLET | Freq: Once | ORAL | Status: AC
  Administered 2015-02-18: 600 mg via ORAL
  Filled 2015-02-18 (×2): qty 1

## 2015-02-18 MED ORDER — HYDROCODONE-ACETAMINOPHEN 5-325 MG PO TABS
1.0000 | ORAL_TABLET | Freq: Once | ORAL | Status: AC
  Administered 2015-02-18: 1 via ORAL
  Filled 2015-02-18: qty 1

## 2015-02-18 MED ORDER — IBUPROFEN 200 MG PO TABS
600.0000 mg | ORAL_TABLET | Freq: Once | ORAL | Status: AC
  Administered 2015-02-18: 600 mg via ORAL
  Filled 2015-02-18 (×2): qty 1

## 2015-02-18 MED ORDER — IBUPROFEN 600 MG PO TABS: 600.0000 mg | ORAL_TABLET | Freq: Four times a day (QID) | ORAL | Status: DC | PRN

## 2015-02-18 MED ORDER — HYDROCODONE-ACETAMINOPHEN 5-325 MG PO TABS
1.0000 | ORAL_TABLET | Freq: Once | ORAL | Status: AC
Start: 2015-02-18 — End: 2015-02-18
  Administered 2015-02-18: 1 via ORAL
  Filled 2015-02-18: qty 1

## 2015-02-18 NOTE — ED Notes (Signed)
Pt. Left with all belongings 

## 2015-02-18 NOTE — ED Notes (Signed)
Pt. Transported to CT 

## 2015-02-18 NOTE — ED Notes (Signed)
Per EDP, Nanavati, No labs and IV needed at this time. Will reevaluate after xrays and CT
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
## 2015-02-18 NOTE — ED Notes (Signed)
Pt also declined pain medicine at this time
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
## 2015-12-26 ENCOUNTER — Inpatient Hospital Stay (HOSPITAL_COMMUNITY)
Admission: AD | Admit: 2015-12-26 | Discharge: 2015-12-26 | Disposition: A | Discharge status: Home / Self Care | Payer: Medicaid Other | Source: Ambulatory Visit | Attending: Family Medicine | Admitting: Family Medicine

## 2015-12-26 ENCOUNTER — Encounter (HOSPITAL_COMMUNITY): Payer: Self-pay | Admitting: *Deleted

## 2015-12-26 DIAGNOSIS — F17210 Nicotine dependence, cigarettes, uncomplicated: Secondary | ICD-10-CM | POA: Diagnosis not present

## 2015-12-26 DIAGNOSIS — B9689 Other specified bacterial agents as the cause of diseases classified elsewhere: Secondary | ICD-10-CM

## 2015-12-26 DIAGNOSIS — N760 Acute vaginitis: Secondary | ICD-10-CM

## 2015-12-26 DIAGNOSIS — R1030 Lower abdominal pain, unspecified: Secondary | ICD-10-CM | POA: Diagnosis present

## 2015-12-26 DIAGNOSIS — A499 Bacterial infection, unspecified: Secondary | ICD-10-CM

## 2015-12-26 LAB — URINALYSIS, ROUTINE W REFLEX MICROSCOPIC
BILIRUBIN URINE: NEGATIVE
Glucose, UA: NEGATIVE mg/dL
Hgb urine dipstick: NEGATIVE
KETONES UR: NEGATIVE mg/dL
LEUKOCYTES UA: NEGATIVE
NITRITE: NEGATIVE
PROTEIN: NEGATIVE mg/dL
Specific Gravity, Urine: 1.01 (ref 1.005–1.030)
pH: 7 (ref 5.0–8.0)

## 2015-12-26 LAB — GC/CHLAMYDIA PROBE AMP (~~LOC~~) NOT AT ARMC
CHLAMYDIA, DNA PROBE: NEGATIVE
Neisseria Gonorrhea: NEGATIVE

## 2015-12-26 LAB — WET PREP, GENITAL
SPERM: NONE SEEN
TRICH WET PREP: NONE SEEN
Yeast Wet Prep HPF POC: NONE SEEN

## 2015-12-26 LAB — POCT PREGNANCY, URINE: Preg Test, Ur: NEGATIVE

## 2015-12-26 MED ORDER — METRONIDAZOLE 500 MG PO TABS: 500.0000 mg | ORAL_TABLET | Freq: Two times a day (BID) | ORAL | Status: DC

## 2015-12-26 NOTE — MAU Note (Signed)
Patient reports abdominal pain and sore nipples for two weeks. Also reports missing her period.

## 2015-12-26 NOTE — Discharge Instructions (Signed)

## 2015-12-26 NOTE — MAU Provider Note (Signed)
History     CSN: [id number]  Arrival date and time: 12/26/15 7829   First Provider Initiated Contact with Patient 12/26/15 0405      Chief Complaint  Patient presents with  . Abdominal Pain   HPI  Lorraine Nguyen is a 30 y.o. [id number] who presents to MAU today with complaint of lower abdominal pain x 2 weeks. The patient states pain is bilateral and comes and goes. She denies pain currently. She has not taken anything for pain. She denies vaginal bleeding, discharge, UTI symptoms, fever, N/V/D or constipation. She is sexually active and does not use condoms or birth control. She states LMP of 11/12/15 and denies history of irregular periods.   OB History    Gravida Para Term Preterm AB TAB SAB Ectopic Multiple Living   History reviewed. No pertinent past medical history.  History reviewed. No pertinent past surgical history.  History reviewed. No pertinent family history.  Social History  Substance Use Topics  . Smoking status: Current Every Day Smoker -- 0.25 packs/day    Types: Cigarettes  . Smokeless tobacco: Never Used  . Alcohol Use: No     Comment: socially     Allergies: No Known Allergies  Prescriptions prior to admission  Medication Sig Dispense Refill Last Dose  . cyclobenzaprine (FLEXERIL) 10 MG tablet Take 1 tablet (10 mg total) by mouth 2 (two) times daily as needed for muscle spasms. (Patient not taking: Reported on 02/18/2015) 20 tablet 0 Past Month at Unknown time  . HYDROcodone-acetaminophen (NORCO/VICODIN) 5-325 MG per tablet Take 1 tablet by mouth every 6 (six) hours as needed. 8 tablet 0   . ibuprofen (ADVIL,MOTRIN) 600 MG tablet Take 1 tablet (600 mg total) by mouth every 6 (six) hours as needed. 30 tablet 0   . traMADol (ULTRAM) 50 MG tablet Take 1 tablet (50 mg total) by mouth every 6 (six) hours as needed. (Patient not taking: Reported on 11/13/2014) 15 tablet 0 Not Taking at Unknown time    Review of Systems   Constitutional: Negative for fever and malaise/fatigue.  Gastrointestinal: Positive for abdominal pain. Negative for nausea, vomiting, diarrhea and constipation.  Genitourinary: Negative for dysuria, urgency and frequency.       Neg - vaginal bleeding, discharge   Physical Exam   Blood pressure 142/83, pulse 102, temperature 98.7 F (37.1 C), temperature source Oral, resp. rate 18, height  (1.88 m), weight 220 lb (99.791 kg), last menstrual period 11/12/2015.  Physical Exam  Nursing note and vitals reviewed. Constitutional: She is oriented to person, place, and time. She appears well-developed and well-nourished. No distress.  HENT:  Head: Normocephalic and atraumatic.  Cardiovascular: Normal rate.   Respiratory: Effort normal.  GI: Soft. She exhibits no distension and no mass. There is no tenderness. There is no rebound and no guarding.  Genitourinary: Uterus is tender (mild). Uterus is not enlarged. Cervix exhibits no motion tenderness, no discharge and no friability. Right adnexum displays no mass and no tenderness. Left adnexum displays no mass and no tenderness. No bleeding in the vagina. Vaginal discharge (small amount of thin, white discharge noted) found.  Neurological: She is alert and oriented to person, place, and time.  Skin: Skin is warm and dry. No erythema.  Psychiatric: She has a normal mood and affect.    Results for orders placed or performed during the hospital encounter of 12/26/15 (from the  past 24 hour(s))  Urinalysis, Routine w reflex microscopic (not at [facility])     Status: None   Collection Time: 12/26/15  3:43 AM  Result Value Ref Range   Color, Urine YELLOW YELLOW   APPearance CLEAR CLEAR   Specific Gravity, Urine 1.010 1.005 - 1.030   pH 7.0 5.0 - 8.0   Glucose, UA NEGATIVE NEGATIVE mg/dL   Hgb urine dipstick NEGATIVE NEGATIVE   Bilirubin Urine NEGATIVE NEGATIVE   Ketones, ur NEGATIVE NEGATIVE mg/dL   Protein, ur NEGATIVE NEGATIVE mg/dL   Nitrite  NEGATIVE NEGATIVE   Leukocytes, UA NEGATIVE NEGATIVE  Pregnancy, urine POC     Status: None   Collection Time: 12/26/15  3:53 AM  Result Value Ref Range   Preg Test, Ur NEGATIVE NEGATIVE  Wet prep, genital     Status: Abnormal   Collection Time: 12/26/15  4:12 AM  Result Value Ref Range   Yeast Wet Prep HPF POC NONE SEEN NONE SEEN   Trich, Wet Prep NONE SEEN NONE SEEN   Clue Cells Wet Prep HPF POC PRESENT (A) NONE SEEN   WBC, Wet Prep HPF POC MANY (A) NONE SEEN   Sperm NONE SEEN     MAU Course  Procedures None  MDM UPT - negative UA, wet prep, GC/Chlamydia today   Assessment and Plan  A: Bacterial vaginosis  P: Discharge home Rx for Flagyl given to patient  Patient advised to follow-up with [facility] as needed or if symptoms persist or worsen Patient may return to MAU as needed or if her condition were to change or worsen   Marny LowensteinJulie N Jansel Vonstein, PA-C  12/26/2015, 4:33 AM

## 2016-04-22 ENCOUNTER — Encounter (HOSPITAL_COMMUNITY): Payer: Self-pay

## 2016-04-22 ENCOUNTER — Inpatient Hospital Stay (HOSPITAL_COMMUNITY)
Admission: AD | Admit: 2016-04-22 | Discharge: 2016-04-22 | Disposition: A | Discharge status: Home / Self Care | Payer: Medicaid Other | Source: Ambulatory Visit | Attending: Obstetrics & Gynecology | Admitting: Obstetrics & Gynecology

## 2016-04-22 ENCOUNTER — Encounter: Payer: Self-pay | Admitting: Family Medicine

## 2016-04-22 ENCOUNTER — Ambulatory Visit (INDEPENDENT_AMBULATORY_CARE_PROVIDER_SITE_OTHER): Payer: Medicaid Other

## 2016-04-22 DIAGNOSIS — Z3201 Encounter for pregnancy test, result positive: Secondary | ICD-10-CM | POA: Diagnosis not present

## 2016-04-22 DIAGNOSIS — F17210 Nicotine dependence, cigarettes, uncomplicated: Secondary | ICD-10-CM | POA: Diagnosis not present

## 2016-04-22 DIAGNOSIS — Z3200 Encounter for pregnancy test, result unknown: Secondary | ICD-10-CM | POA: Diagnosis present

## 2016-04-22 DIAGNOSIS — Z3490 Encounter for supervision of normal pregnancy, unspecified, unspecified trimester: Secondary | ICD-10-CM

## 2016-04-22 LAB — POCT PREGNANCY, URINE: Preg Test, Ur: POSITIVE — AB

## 2016-04-22 NOTE — MAU Provider Note (Signed)
History   [id number]   Chief Complaint  Patient presents with  . Possible Pregnancy    HPI Lorraine Nguyen is a 30 y.o. female [phone] here here for confirmation of pregnancy.  Patient's Patient's last menstrual period was 03/19/2016.Marland Kitchen.  Denies any vaginal bleeding or abdominal pain.  All other systems within normal limits.    Review of Systems  Gastrointestinal: Negative for abdominal pain, constipation, nausea and vomiting.  Genitourinary: Negative for dysuria, pelvic pain and vaginal bleeding.  All other systems reviewed and are negative.  Patient's last menstrual period was 03/19/2016.  OB History  Gravida Para Term Preterm AB Living  2 2 2     2   SAB TAB Ectopic Multiple Live Births          2    # Outcome Date GA Lbr Len/2nd Weight Sex Delivery Anes PTL Lv  2 Term      Vag-Spont   LIV  1 Term      Vag-Spont   LIV      No past medical history on file.  No family history on file.  Social History   Social History  . Marital status: Single    Spouse name: N/A  . Number of children: N/A  . Years of education: N/A   Social History Main Topics  . Smoking status: Current Every Day Smoker    Packs/day: 0.25    Types: Cigarettes  . Smokeless tobacco: Never Used  . Alcohol use No     Comment: socially   . Drug use: No  . Sexual activity: Yes   Other Topics Concern  . None   Social History Narrative  . None    No Known Allergies  No current facility-administered medications on file prior to encounter.    Current Outpatient Prescriptions on File Prior to Encounter  Medication Sig Dispense Refill  . cyclobenzaprine (FLEXERIL) 10 MG tablet Take 1 tablet (10 mg total) by mouth 2 (two) times daily as needed for muscle spasms. (Patient not taking: Reported on 02/18/2015) 20 tablet 0  . HYDROcodone-acetaminophen (NORCO/VICODIN) 5-325 MG per tablet Take 1 tablet by mouth every 6 (six) hours as needed. 8 tablet 0  . ibuprofen (ADVIL,MOTRIN) 600 MG tablet Take 1  tablet (600 mg total) by mouth every 6 (six) hours as needed. 30 tablet 0  . metroNIDAZOLE (FLAGYL) 500 MG tablet Take 1 tablet (500 mg total) by mouth 2 (two) times daily. 14 tablet 0  . traMADol (ULTRAM) 50 MG tablet Take 1 tablet (50 mg total) by mouth every 6 (six) hours as needed. (Patient not taking: Reported on 11/13/2014) 15 tablet 0     Physical Exam   Vitals:   04/22/16 0044  BP: 152/79  Pulse: 91  Resp: 18  Temp: 98.4 F (36.9 C)  TempSrc: Oral    Physical Exam  Constitutional: She is oriented to person, place, and time. She appears well-developed and well-nourished. No distress.  HENT:  Head: Normocephalic.  Neck: Neck supple.  Respiratory: Effort normal and breath sounds normal.  Neurological: She is alert and oriented to person, place, and time. She has normal reflexes.  Skin: Skin is warm and dry.  Psychiatric: She has a normal mood and affect.    MAU Course  Procedures  Medical Screening Exam completed  Assessment and Plan  Pregnancy Test   [facility] for [facility] test in AM Explained to return if +abdominal pain or vaginal bleeding  Marlis EdelsonWalidah N Karim, CNM  04/22/2016 12:58 AM

## 2016-04-22 NOTE — MAU Note (Signed)
Pt presents stating she missed her period. LMP 03/19/16. +HPT today. Denies vaginal bleeding. Denies pain at this time.

## 2016-04-22 NOTE — Progress Notes (Signed)
Patient presented today for pregnancy test. Test confirms she is pregnant around four weeks. Medication have been reviewed and patient has been given a letter of pregnancy letter. Patient plans to follow up with her OB/GYN of choice.

## 2016-05-02 ENCOUNTER — Encounter (HOSPITAL_COMMUNITY): Payer: Self-pay | Admitting: *Deleted

## 2016-05-02 ENCOUNTER — Inpatient Hospital Stay (HOSPITAL_COMMUNITY)
Admission: AD | Admit: 2016-05-02 | Discharge: 2016-05-02 | Disposition: A | Discharge status: Home / Self Care | Payer: Medicaid Other | Source: Ambulatory Visit | Attending: Obstetrics & Gynecology | Admitting: Obstetrics & Gynecology

## 2016-05-02 DIAGNOSIS — O9989 Other specified diseases and conditions complicating pregnancy, childbirth and the puerperium: Secondary | ICD-10-CM

## 2016-05-02 DIAGNOSIS — R102 Pelvic and perineal pain: Secondary | ICD-10-CM | POA: Diagnosis present

## 2016-05-02 DIAGNOSIS — O26891 Other specified pregnancy related conditions, first trimester: Secondary | ICD-10-CM | POA: Diagnosis not present

## 2016-05-02 DIAGNOSIS — Z3A01 Less than 8 weeks gestation of pregnancy: Secondary | ICD-10-CM | POA: Diagnosis not present

## 2016-05-02 DIAGNOSIS — R109 Unspecified abdominal pain: Secondary | ICD-10-CM | POA: Insufficient documentation

## 2016-05-02 DIAGNOSIS — O99331 Smoking (tobacco) complicating pregnancy, first trimester: Secondary | ICD-10-CM | POA: Diagnosis not present

## 2016-05-02 DIAGNOSIS — O26899 Other specified pregnancy related conditions, unspecified trimester: Secondary | ICD-10-CM

## 2016-05-02 HISTORY — DX: Other specified health status: Z78.9

## 2016-05-02 LAB — URINALYSIS, ROUTINE W REFLEX MICROSCOPIC
Bilirubin Urine: NEGATIVE
Glucose, UA: NEGATIVE mg/dL
Ketones, ur: NEGATIVE mg/dL
LEUKOCYTES UA: NEGATIVE
Nitrite: NEGATIVE
Protein, ur: NEGATIVE mg/dL
SPECIFIC GRAVITY, URINE: 1.01 (ref 1.005–1.030)
pH: 5.5 (ref 5.0–8.0)

## 2016-05-02 LAB — WET PREP, GENITAL
Clue Cells Wet Prep HPF POC: NONE SEEN
Sperm: NONE SEEN
Trich, Wet Prep: NONE SEEN
YEAST WET PREP: NONE SEEN

## 2016-05-02 LAB — URINE MICROSCOPIC-ADD ON

## 2016-05-02 NOTE — Discharge Instructions (Signed)

## 2016-05-02 NOTE — MAU Note (Signed)
Pt reports abd cramping x one week. Positive preg test.

## 2016-05-02 NOTE — MAU Provider Note (Signed)
Chief Complaint: Abdominal Cramping    SUBJECTIVE HPI: Lorraine Nguyen is a 30 y.o. G3P2002 at [id number] who presents to [facility] reporting abdominal/pelvic pain.  Pain started 1 week ago, comes/goes. Pain described as dull achy, with occasionally sharp pain, 2/10. Nothing triggers pain, nothing makes pain go away. Able to sleep through pain, does not wake patient at night. No vaginal bleeding. No abnormal vaginal discharge. Never had anything like this before. Denies N/V. Not taking anything for the pain.  No history of STDs. LMP 03/19/16.    Past Medical History:  Diagnosis Date  . Medical history non-contributory    OB History  Gravida Para Term Preterm AB Living  3 2 2     2   SAB TAB Ectopic Multiple Live Births          2    # Outcome Date GA Lbr Len/2nd Weight Sex Delivery Anes PTL Lv  3 Current           2 Term      Vag-Spont   LIV  1 Term      Vag-Spont   LIV     Past Surgical History:  Procedure Laterality Date  . NO PAST SURGERIES     Social History   Social History  . Marital status: Single    Spouse name: N/A  . Number of children: N/A  . Years of education: N/A   Occupational History  . Not on file.   Social History Main Topics  . Smoking status: Current Every Day Smoker    Packs/day: 0.25    Types: Cigarettes  . Smokeless tobacco: Never Used  . Alcohol use No     Comment: socially   . Drug use: No  . Sexual activity: Yes   Other Topics Concern  . Not on file   Social History Narrative  . No narrative on file   No current facility-administered medications on file prior to encounter.    Current Outpatient Prescriptions on File Prior to Encounter  Medication Sig Dispense Refill  . cyclobenzaprine (FLEXERIL) 10 MG tablet Take 1 tablet (10 mg total) by mouth 2 (two) times daily as needed for muscle spasms. (Patient not taking: Reported on 04/22/2016) 20 tablet 0  . HYDROcodone-acetaminophen (NORCO/VICODIN) 5-325 MG per tablet Take 1  tablet by mouth every 6 (six) hours as needed. (Patient not taking: Reported on 04/22/2016) 8 tablet 0  . ibuprofen (ADVIL,MOTRIN) 600 MG tablet Take 1 tablet (600 mg total) by mouth every 6 (six) hours as needed. (Patient not taking: Reported on 04/22/2016) 30 tablet 0  . metroNIDAZOLE (FLAGYL) 500 MG tablet Take 1 tablet (500 mg total) by mouth 2 (two) times daily. 14 tablet 0  . traMADol (ULTRAM) 50 MG tablet Take 1 tablet (50 mg total) by mouth every 6 (six) hours as needed. (Patient not taking: Reported on 04/22/2016) 15 tablet 0   No Known Allergies  I have reviewed the past Medical Hx, Surgical Hx, Social Hx, Allergies and Medications.   REVIEW OF SYSTEMS  OPHTHALMIC: negative for - blurry vision, decreased vision, double vision, photophobia or scotomata RESPIRATORY: no cough, shortness of breath, or wheezing CARDIOVASCULAR: no chest pain or dyspnea on exertion GASTROINTESTINAL: + abdominal pain/cramping, no change in bowel habits, or black or bloody stools negative for - epigastric or RUQ pain GENITO-URINARY: no dysuria, trouble voiding, or hematuria negative for - genital discharge, vulvar/vaginal symptoms or vaginal bleeding MUSKULOSKELETAL: negative for - gait disturbance or swelling in ankle -  bilateral, foot - bilateral and leg - bilateral NEUROLOGICAL: negative for - dizziness, gait disturbance, headaches, numbness/tingling or visual changes DERMATOLOGICAL: negative OBSTETRICAL: No vaginal bleeding.   OBJECTIVE Patient Vitals for the past 24 hrs:  BP Temp Temp src Pulse Resp SpO2 Height Weight  05/02/16 0035 122/72 99 F (37.2 C) Oral 88 16 100 % 6\' 2"  (1.88 m) 232 lb (105.2 kg)    PHYSICAL EXAM Constitutional: Well-developed, obese, well-nourished female in no acute distress.  Cardiovascular: normal rate Respiratory: normal rate and effort.  GI: Abd soft, non-tender, gravid appropriate for gestational age. Pos BS x 4 MS: Extremities nontender, no edema, normal  ROM Neurologic: Alert and oriented x 4.  GU: Neg CVAT.  BIMANUAL: cervix closed; uterus normal size, no adnexal tenderness or masses. No CMT.  LAB RESULTS Results for orders placed or performed during the hospital encounter of 05/02/16 (from the past 24 hour(s))  Urinalysis, Routine w reflex microscopic (not at [facility])     Status: Abnormal   Collection Time: 05/02/16 12:32 AM  Result Value Ref Range   Color, Urine YELLOW YELLOW   APPearance CLEAR CLEAR   Specific Gravity, Urine 1.010 1.005 - 1.030   pH 5.5 5.0 - 8.0   Glucose, UA NEGATIVE NEGATIVE mg/dL   Hgb urine dipstick TRACE (A) NEGATIVE   Bilirubin Urine NEGATIVE NEGATIVE   Ketones, ur NEGATIVE NEGATIVE mg/dL   Protein, ur NEGATIVE NEGATIVE mg/dL   Nitrite NEGATIVE NEGATIVE   Leukocytes, UA NEGATIVE NEGATIVE  Urine microscopic-add on     Status: Abnormal   Collection Time: 05/02/16 12:32 AM  Result Value Ref Range   Squamous Epithelial / LPF 0-5 (A) NONE SEEN   WBC, UA 0-5 0 - 5 WBC/hpf   RBC / HPF 0-5 0 - 5 RBC/hpf   Bacteria, UA RARE (A) NONE SEEN  Wet prep, genital     Status: Abnormal   Collection Time: 05/02/16  1:35 AM  Result Value Ref Range   Yeast Wet Prep HPF POC NONE SEEN NONE SEEN   Trich, Wet Prep NONE SEEN NONE SEEN   Clue Cells Wet Prep HPF POC NONE SEEN NONE SEEN   WBC, Wet Prep HPF POC FEW (A) NONE SEEN   Sperm NONE SEEN     IMAGING No results found.  MAU COURSE Wet prep- NEG, Gc/Chl - pending UA- NEG 2:38 AM - Pain has resolved, patient ready to go home. She will call to set up her first OB appt, plans to go to [facility].  MDM Plan of care reviewed with patient, including labs and tests ordered and medical treatment.  ASSESSMENT 1. Abdominal cramping affecting pregnancy     PLAN Discharge home in stable condition. F/u with OB for first visit   Follow-up Information    [facility] Follow up in 2 week(s).   Why:  Initial OB visit Contact information: [address] [place] [address] [phone]           Medication List    STOP taking these medications   cyclobenzaprine 10 MG tablet Commonly known as:  FLEXERIL   HYDROcodone-acetaminophen 5-325 MG tablet Commonly known as:  NORCO/VICODIN   ibuprofen 600 MG tablet Commonly known as:  ADVIL,MOTRIN   traMADol 50 MG tablet Commonly known as:  ULTRAM     TAKE these medications   metroNIDAZOLE 500 MG tablet Commonly known as:  FLAGYL Take 1 tablet (500 mg total) by mouth 2 (two) times daily.  [address] Miner[place]umaw, [place] 05/02/2016  2:38 AM

## 2016-05-03 LAB — GC/CHLAMYDIA PROBE AMP (~~LOC~~) NOT AT ARMC
CHLAMYDIA, DNA PROBE: NEGATIVE
Neisseria Gonorrhea: NEGATIVE

## 2016-05-26 DIAGNOSIS — O0990 Supervision of high risk pregnancy, unspecified, unspecified trimester: Secondary | ICD-10-CM | POA: Insufficient documentation

## 2016-05-27 ENCOUNTER — Encounter: Payer: Self-pay | Admitting: Family Medicine

## 2016-05-27 ENCOUNTER — Ambulatory Visit (INDEPENDENT_AMBULATORY_CARE_PROVIDER_SITE_OTHER): Payer: Medicaid Other | Admitting: Family Medicine

## 2016-05-27 VITALS — BP 110/73 | HR 85 | Temp 99.1°F | Wt 233.2 lb

## 2016-05-27 DIAGNOSIS — N898 Other specified noninflammatory disorders of vagina: Secondary | ICD-10-CM

## 2016-05-27 DIAGNOSIS — R8271 Bacteriuria: Secondary | ICD-10-CM

## 2016-05-27 DIAGNOSIS — Z3480 Encounter for supervision of other normal pregnancy, unspecified trimester: Secondary | ICD-10-CM

## 2016-05-27 LAB — OB RESULTS CONSOLE GBS: GBS: POSITIVE

## 2016-05-27 MED ORDER — CONCEPT OB 130-92.4-1 MG PO CAPS: 1.0000 | ORAL_CAPSULE | Freq: Every day | ORAL | 11 refills | Status: DC

## 2016-05-27 NOTE — Patient Instructions (Signed)
 First Trimester of Pregnancy The first trimester of pregnancy is from week 1 until the end of week 12 (months 1 through 3). A week after a sperm fertilizes an egg, the egg will implant on the wall of the uterus. This embryo will begin to develop into a baby. Genes from you and your partner are forming the baby. The female genes determine whether the baby is a boy or a girl. At 6-8 weeks, the eyes and face are formed, and the heartbeat can be seen on ultrasound. At the end of 12 weeks, all the baby's organs are formed.  Now that you are pregnant, you will want to do everything you can to have a healthy baby. Two of the most important things are to get good prenatal care and to follow your health care provider's instructions. Prenatal care is all the medical care you receive before the baby's birth. This care will help prevent, find, and treat any problems during the pregnancy and childbirth. BODY CHANGES Your body goes through many changes during pregnancy. The changes vary from woman to woman.   You may gain or lose a couple of pounds at first.  You may feel sick to your stomach (nauseous) and throw up (vomit). If the vomiting is uncontrollable, call your health care provider.  You may tire easily.  You may develop headaches that can be relieved by medicines approved by your health care provider.  You may urinate more often. Painful urination may mean you have a bladder infection.  You may develop heartburn as a result of your pregnancy.  You may develop constipation because certain hormones are causing the muscles that push waste through your intestines to slow down.  You may develop hemorrhoids or swollen, bulging veins (varicose veins).  Your breasts may begin to grow larger and become tender. Your nipples may stick out more, and the tissue that surrounds them (areola) may become darker.  Your gums may bleed and may be sensitive to brushing and flossing.  Dark spots or blotches  (chloasma, mask of pregnancy) may develop on your face. This will likely fade after the baby is born.  Your menstrual periods will stop.  You may have a loss of appetite.  You may develop cravings for certain kinds of food.  You may have changes in your emotions from day to day, such as being excited to be pregnant or being concerned that something may go wrong with the pregnancy and baby.  You may have more vivid and strange dreams.  You may have changes in your hair. These can include thickening of your hair, rapid growth, and changes in texture. Some women also have hair loss during or after pregnancy, or hair that feels dry or thin. Your hair will most likely return to normal after your baby is born. WHAT TO EXPECT AT YOUR PRENATAL VISITS During a routine prenatal visit:  You will be weighed to make sure you and the baby are growing normally.  Your blood pressure will be taken.  Your abdomen will be measured to track your baby's growth.  The fetal heartbeat will be listened to starting around week 10 or 12 of your pregnancy.  Test results from any previous visits will be discussed. Your health care provider may ask you:  How you are feeling.  If you are feeling the baby move.  If you have had any abnormal symptoms, such as leaking fluid, bleeding, severe headaches, or abdominal cramping.  If you are using any tobacco   products, including cigarettes, chewing tobacco, and electronic cigarettes.  If you have any questions. Other tests that may be performed during your first trimester include:  Blood tests to find your blood type and to check for the presence of any previous infections. They will also be used to check for low iron levels (anemia) and Rh antibodies. Later in the pregnancy, blood tests for diabetes will be done along with other tests if problems develop.  Urine tests to check for infections, diabetes, or protein in the urine.  An ultrasound to confirm the  proper growth and development of the baby.  An amniocentesis to check for possible genetic problems.  Fetal screens for spina bifida and Down syndrome.  You may need other tests to make sure you and the baby are doing well.  HIV (human immunodeficiency virus) testing. Routine prenatal testing includes screening for HIV, unless you choose not to have this test. HOME CARE INSTRUCTIONS  Medicines  Follow your health care provider's instructions regarding medicine use. Specific medicines may be either safe or unsafe to take during pregnancy.  Take your prenatal vitamins as directed.  If you develop constipation, try taking a stool softener if your health care provider approves. Diet  Eat regular, well-balanced meals. Choose a variety of foods, such as meat or vegetable-based protein, fish, milk and low-fat dairy products, vegetables, fruits, and whole grain breads and cereals. Your health care provider will help you determine the amount of weight gain that is right for you.  Avoid raw meat and uncooked cheese. These carry germs that can cause birth defects in the baby.  Eating four or five small meals rather than three large meals a day may help relieve nausea and vomiting. If you start to feel nauseous, eating a few soda crackers can be helpful. Drinking liquids between meals instead of during meals also seems to help nausea and vomiting.  If you develop constipation, eat more high-fiber foods, such as fresh vegetables or fruit and whole grains. Drink enough fluids to keep your urine clear or pale yellow. Activity and Exercise  Exercise only as directed by your health care provider. Exercising will help you:  Control your weight.  Stay in shape.  Be prepared for labor and delivery.  Experiencing pain or cramping in the lower abdomen or low back is a good sign that you should stop exercising. Check with your health care provider before continuing normal exercises.  Try to avoid  standing for long periods of time. Move your legs often if you must stand in one place for a long time.  Avoid heavy lifting.  Wear low-heeled shoes, and practice good posture.  You may continue to have sex unless your health care provider directs you otherwise. Relief of Pain or Discomfort  Wear a good support bra for breast tenderness.   Take warm sitz baths to soothe any pain or discomfort caused by hemorrhoids. Use hemorrhoid cream if your health care provider approves.   Rest with your legs elevated if you have leg cramps or low back pain.  If you develop varicose veins in your legs, wear support hose. Elevate your feet for 15 minutes, 3-4 times a day. Limit salt in your diet. Prenatal Care  Schedule your prenatal visits by the twelfth week of pregnancy. They are usually scheduled monthly at first, then more often in the last 2 months before delivery.  Write down your questions. Take them to your prenatal visits.  Keep all your prenatal visits as directed by   your health care provider. Safety  Wear your seat belt at all times when driving.  Make a list of emergency phone numbers, including numbers for family, friends, the hospital, and police and fire departments. General Tips  Ask your health care provider for a referral to a local prenatal education class. Begin classes no later than at the beginning of month 6 of your pregnancy.  Ask for help if you have counseling or nutritional needs during pregnancy. Your health care provider can offer advice or refer you to specialists for help with various needs.  Do not use hot tubs, steam rooms, or saunas.  Do not douche or use tampons or scented sanitary pads.  Do not cross your legs for long periods of time.  Avoid cat litter boxes and soil used by cats. These carry germs that can cause birth defects in the baby and possibly loss of the fetus by miscarriage or stillbirth.  Avoid all smoking, herbs, alcohol, and medicines  not prescribed by your health care provider. Chemicals in these affect the formation and growth of the baby.  Do not use any tobacco products, including cigarettes, chewing tobacco, and electronic cigarettes. If you need help quitting, ask your health care provider. You may receive counseling support and other resources to help you quit.  Schedule a dentist appointment. At home, brush your teeth with a soft toothbrush and be gentle when you floss. SEEK MEDICAL CARE IF:   You have dizziness.  You have mild pelvic cramps, pelvic pressure, or nagging pain in the abdominal area.  You have persistent nausea, vomiting, or diarrhea.  You have a bad smelling vaginal discharge.  You have pain with urination.  You notice increased swelling in your face, hands, legs, or ankles. SEEK IMMEDIATE MEDICAL CARE IF:   You have a fever.  You are leaking fluid from your vagina.  You have spotting or bleeding from your vagina.  You have severe abdominal cramping or pain.  You have rapid weight gain or loss.  You vomit blood or material that looks like coffee grounds.  You are exposed to German measles and have never had them.  You are exposed to fifth disease or chickenpox.  You develop a severe headache.  You have shortness of breath.  You have any kind of trauma, such as from a fall or a car accident.   This information is not intended to replace advice given to you by your health care provider. Make sure you discuss any questions you have with your health care provider.   Document Released: 08/20/2001 Document Revised: 09/16/2014 Document Reviewed: 07/06/2013 Elsevier Interactive Patient Education 2016 [employer]   Breastfeeding Deciding to breastfeed is one of the best choices you can make for you and your baby. A change in hormones during pregnancy causes your breast tissue to grow and increases the number and size of your milk ducts. These hormones also allow proteins, sugars,  and fats from your blood supply to make breast milk in your milk-producing glands. Hormones prevent breast milk from being released before your baby is born as well as prompt milk flow after birth. Once breastfeeding has begun, thoughts of your baby, as well as his or her sucking or crying, can stimulate the release of milk from your milk-producing glands.  BENEFITS OF BREASTFEEDING For Your Baby  Your first milk (colostrum) helps your baby's digestive system function better.  There are antibodies in your milk that help your baby fight off infections.  Your baby has   a lower incidence of asthma, allergies, and sudden infant death syndrome.  The nutrients in breast milk are better for your baby than infant formulas and are designed uniquely for your baby's needs.  Breast milk improves your baby's brain development.  Your baby is less likely to develop other conditions, such as childhood obesity, asthma, or type 2 diabetes mellitus. For You  Breastfeeding helps to create a very special bond between you and your baby.  Breastfeeding is convenient. Breast milk is always available at the correct temperature and costs nothing.  Breastfeeding helps to burn calories and helps you lose the weight gained during pregnancy.  Breastfeeding makes your uterus contract to its prepregnancy size faster and slows bleeding (lochia) after you give birth.   Breastfeeding helps to lower your risk of developing type 2 diabetes mellitus, osteoporosis, and breast or ovarian cancer later in life. SIGNS THAT YOUR BABY IS HUNGRY Early Signs of Hunger  Increased alertness or activity.  Stretching.  Movement of the head from side to side.  Movement of the head and opening of the mouth when the corner of the mouth or cheek is stroked (rooting).  Increased sucking sounds, smacking lips, cooing, sighing, or squeaking.  Hand-to-mouth movements.  Increased sucking of fingers or hands. Late Signs of  Hunger  Fussing.  Intermittent crying. Extreme Signs of Hunger Signs of extreme hunger will require calming and consoling before your baby will be able to breastfeed successfully. Do not wait for the following signs of extreme hunger to occur before you initiate breastfeeding:  Restlessness.  A loud, strong cry.  Screaming. BREASTFEEDING BASICS Breastfeeding Initiation  Find a comfortable place to sit or lie down, with your neck and back well supported.  Place a pillow or rolled up blanket under your baby to bring him or her to the level of your breast (if you are seated). Nursing pillows are specially designed to help support your arms and your baby while you breastfeed.  Make sure that your baby's abdomen is facing your abdomen.  Gently massage your breast. With your fingertips, massage from your chest wall toward your nipple in a circular motion. This encourages milk flow. You may need to continue this action during the feeding if your milk flows slowly.  Support your breast with 4 fingers underneath and your thumb above your nipple. Make sure your fingers are well away from your nipple and your baby's mouth.  Stroke your baby's lips gently with your finger or nipple.  When your baby's mouth is open wide enough, quickly bring your baby to your breast, placing your entire nipple and as much of the colored area around your nipple (areola) as possible into your baby's mouth.  More areola should be visible above your baby's upper lip than below the lower lip.  Your baby's tongue should be between his or her lower gum and your breast.  Ensure that your baby's mouth is correctly positioned around your nipple (latched). Your baby's lips should create a seal on your breast and be turned out (everted).  It is common for your baby to suck about 2-3 minutes in order to start the flow of breast milk. Latching Teaching your baby how to latch on to your breast properly is very important.  An improper latch can cause nipple pain and decreased milk supply for you and poor weight gain in your baby. Also, if your baby is not latched onto your nipple properly, he or she may swallow some air during feeding. This   can make your baby fussy. Burping your baby when you switch breasts during the feeding can help to get rid of the air. However, teaching your baby to latch on properly is still the best way to prevent fussiness from swallowing air while breastfeeding. Signs that your baby has successfully latched on to your nipple:  Silent tugging or silent sucking, without causing you pain.  Swallowing heard between every 3-4 sucks.  Muscle movement above and in front of his or her ears while sucking. Signs that your baby has not successfully latched on to nipple:  Sucking sounds or smacking sounds from your baby while breastfeeding.  Nipple pain. If you think your baby has not latched on correctly, slip your finger into the corner of your baby's mouth to break the suction and place it between your baby's gums. Attempt breastfeeding initiation again. Signs of Successful Breastfeeding Signs from your baby:  A gradual decrease in the number of sucks or complete cessation of sucking.  Falling asleep.  Relaxation of his or her body.  Retention of a small amount of milk in his or her mouth.  Letting go of your breast by himself or herself. Signs from you:  Breasts that have increased in firmness, weight, and size 1-3 hours after feeding.  Breasts that are softer immediately after breastfeeding.  Increased milk volume, as well as a change in milk consistency and color by the fifth day of breastfeeding.  Nipples that are not sore, cracked, or bleeding. Signs That Your Baby is Getting Enough Milk  Wetting at least 3 diapers in a 24-hour period. The urine should be clear and pale yellow by age 5 days.  At least 3 stools in a 24-hour period by age 5 days. The stool should be soft and  yellow.  At least 3 stools in a 24-hour period by age 7 days. The stool should be seedy and yellow.  No loss of weight greater than 10% of birth weight during the first 3 days of age.  Average weight gain of 4-7 ounces (113-198 g) per week after age 4 days.  Consistent daily weight gain by age 5 days, without weight loss after the age of 2 weeks. After a feeding, your baby may spit up a small amount. This is common. BREASTFEEDING FREQUENCY AND DURATION Frequent feeding will help you make more milk and can prevent sore nipples and breast engorgement. Breastfeed when you feel the need to reduce the fullness of your breasts or when your baby shows signs of hunger. This is called "breastfeeding on demand." Avoid introducing a pacifier to your baby while you are working to establish breastfeeding (the first 4-6 weeks after your baby is born). After this time you may choose to use a pacifier. Research has shown that pacifier use during the first year of a baby's life decreases the risk of sudden infant death syndrome (SIDS). Allow your baby to feed on each breast as long as he or she wants. Breastfeed until your baby is finished feeding. When your baby unlatches or falls asleep while feeding from the first breast, offer the second breast. Because newborns are often sleepy in the first few weeks of life, you may need to awaken your baby to get him or her to feed. Breastfeeding times will vary from baby to baby. However, the following rules can serve as a guide to help you ensure that your baby is properly fed:  Newborns (babies 4 weeks of age or younger) may breastfeed every 1-3 hours.    Newborns should not go longer than 3 hours during the day or 5 hours during the night without breastfeeding.  You should breastfeed your baby a minimum of 8 times in a 24-hour period until you begin to introduce solid foods to your baby at around 6 months of age. BREAST MILK PUMPING Pumping and storing breast milk  allows you to ensure that your baby is exclusively fed your breast milk, even at times when you are unable to breastfeed. This is especially important if you are going back to work while you are still breastfeeding or when you are not able to be present during feedings. Your lactation consultant can give you guidelines on how long it is safe to store breast milk. A breast pump is a machine that allows you to pump milk from your breast into a sterile bottle. The pumped breast milk can then be stored in a refrigerator or freezer. Some breast pumps are operated by hand, while others use electricity. Ask your lactation consultant which type will work best for you. Breast pumps can be purchased, but some hospitals and breastfeeding support groups lease breast pumps on a monthly basis. A lactation consultant can teach you how to hand express breast milk, if you prefer not to use a pump. CARING FOR YOUR BREASTS WHILE YOU BREASTFEED Nipples can become dry, cracked, and sore while breastfeeding. The following recommendations can help keep your breasts moisturized and healthy:  Avoid using soap on your nipples.  Wear a supportive bra. Although not required, special nursing bras and tank tops are designed to allow access to your breasts for breastfeeding without taking off your entire bra or top. Avoid wearing underwire-style bras or extremely tight bras.  Air dry your nipples for 3-4minutes after each feeding.  Use only cotton bra pads to absorb leaked breast milk. Leaking of breast milk between feedings is normal.  Use lanolin on your nipples after breastfeeding. Lanolin helps to maintain your skin's normal moisture barrier. If you use pure lanolin, you do not need to wash it off before feeding your baby again. Pure lanolin is not toxic to your baby. You may also hand express a few drops of breast milk and gently massage that milk into your nipples and allow the milk to air dry. In the first few weeks after  giving birth, some women experience extremely full breasts (engorgement). Engorgement can make your breasts feel heavy, warm, and tender to the touch. Engorgement peaks within 3-5 days after you give birth. The following recommendations can help ease engorgement:  Completely empty your breasts while breastfeeding or pumping. You may want to start by applying warm, moist heat (in the shower or with warm water-soaked hand towels) just before feeding or pumping. This increases circulation and helps the milk flow. If your baby does not completely empty your breasts while breastfeeding, pump any extra milk after he or she is finished.  Wear a snug bra (nursing or regular) or tank top for 1-2 days to signal your body to slightly decrease milk production.  Apply ice packs to your breasts, unless this is too uncomfortable for you.  Make sure that your baby is latched on and positioned properly while breastfeeding. If engorgement persists after 48 hours of following these recommendations, contact your health care provider or a lactation consultant. OVERALL HEALTH CARE RECOMMENDATIONS WHILE BREASTFEEDING  Eat healthy foods. Alternate between meals and snacks, eating 3 of each per day. Because what you eat affects your breast milk, some of the foods   may make your baby more irritable than usual. Avoid eating these foods if you are sure that they are negatively affecting your baby.  Drink milk, fruit juice, and water to satisfy your thirst (about 10 glasses a day).  Rest often, relax, and continue to take your prenatal vitamins to prevent fatigue, stress, and anemia.  Continue breast self-awareness checks.  Avoid chewing and smoking tobacco. Chemicals from cigarettes that pass into breast milk and exposure to secondhand smoke may harm your baby.  Avoid alcohol and drug use, including marijuana. Some medicines that may be harmful to your baby can pass through breast milk. It is important to ask your health  care provider before taking any medicine, including all over-the-counter and prescription medicine as well as vitamin and herbal supplements. It is possible to become pregnant while breastfeeding. If birth control is desired, ask your health care provider about options that will be safe for your baby. SEEK MEDICAL CARE IF:  You feel like you want to stop breastfeeding or have become frustrated with breastfeeding.  You have painful breasts or nipples.  Your nipples are cracked or bleeding.  Your breasts are red, tender, or warm.  You have a swollen area on either breast.  You have a fever or chills.  You have nausea or vomiting.  You have drainage other than breast milk from your nipples.  Your breasts do not become full before feedings by the fifth day after you give birth.  You feel sad and depressed.  Your baby is too sleepy to eat well.  Your baby is having trouble sleeping.   Your baby is wetting less than 3 diapers in a 24-hour period.  Your baby has less than 3 stools in a 24-hour period.  Your baby's skin or the white part of his or her eyes becomes yellow.   Your baby is not gaining weight by 5 days of age. SEEK IMMEDIATE MEDICAL CARE IF:  Your baby is overly tired (lethargic) and does not want to wake up and feed.  Your baby develops an unexplained fever.   This information is not intended to replace advice given to you by your health care provider. Make sure you discuss any questions you have with your health care provider.   Document Released: 08/26/2005 Document Revised: 05/17/2015 Document Reviewed: 02/17/2013 [employer] Interactive Patient Education 2016 [employer]  

## 2016-05-27 NOTE — Progress Notes (Signed)
Patient states that she feels pressure that comes and goes, but not daily and no vaginal bleeding.

## 2016-05-27 NOTE — Progress Notes (Signed)
    Subjective:    Lorraine Nguyen is a [id number] 188w6d being seen today for her first obstetrical visit.  Her obstetrical history is not significant.  Pregnancy history fully reviewed.  Patient reports no complaints.  There were no vitals filed for this visit.  HISTORY: OB History  Gravida Para Term Preterm AB Living  3 2 2     2   SAB TAB Ectopic Multiple Live Births          2    # Outcome Date GA Lbr Len/2nd Weight Sex Delivery Anes PTL Lv  3 Current           2 Term      Vag-Spont   LIV  1 Term      Vag-Spont   LIV     Past Medical History:  Diagnosis Date  . Medical history non-contributory    Past Surgical History:  Procedure Laterality Date  . NO PAST SURGERIES     Family History  Problem Relation Age of Onset  . Diabetes Maternal Grandmother   . Hypertension Maternal Grandmother   . Diabetes Paternal Grandmother   . Hypertension Paternal Grandfather      Exam    Uterus:   10 wk size  Pelvic Exam:    Perineum: Normal Perineum   Vulva: Bartholin's, Urethra, Skene's normal   Vagina:  normal mucosa, normal discharge   Cervix: multiparous appearance and no lesions   Adnexa: normal adnexa   Bony Pelvis: average  System: Breast:  normal appearance, no masses or tenderness   Skin: normal coloration and turgor, no rashes    Neurologic: normal mood   Extremities: normal strength, tone, and muscle mass   HEENT extra ocular movement intact and sclera clear, anicteric   Mouth/Teeth mucous membranes moist, pharynx normal without lesions and dental hygiene good   Neck supple   Cardiovascular: regular rate and rhythm, no murmurs or gallops   Respiratory:  appears well, vitals normal, no respiratory distress, acyanotic, normal RR, ear and throat exam is normal, neck free of mass or lymphadenopathy, chest clear, no wheezing, crepitations, rhonchi, normal symmetric air entry   Abdomen: soft, non-tender; bowel sounds normal; no masses,  no organomegaly       Assessment/Plan:   1. Supervision of normal pregnancy, antepartum, unspecified trimester Labs ordered, wants first screen, history and problem list updated - Cystic Fibrosis Mutation 97 - ToxASSURE Select 13 (MW), Urine - US MFM Fetal Nuchal Translucency; Future - Hemoglobinopathy evaluation - Pap IG w/ reflex to HPV when ASC-U - Prenatal Profile I - HIV antibody - Culture, OB Urine - Prenat w/o A Vit-FeFum-FePo-FA (CONCEPT OB) 130-92.4-1 MG CAPS; Take 1 capsule by mouth daily.  Dispense: 30 capsule; Refill: 11  2. Vaginal discharge Check wetprep - NuSwab VG+, Candida 6sp   Lorraine Nguyen 05/27/2016

## 2016-05-30 LAB — PAP IG W/ RFLX HPV ASCU: PAP SMEAR COMMENT: 0

## 2016-05-31 DIAGNOSIS — R8271 Bacteriuria: Secondary | ICD-10-CM | POA: Insufficient documentation

## 2016-05-31 LAB — NUSWAB VG+, CANDIDA 6SP
CANDIDA PARAPSILOSIS, NAA: NEGATIVE
CHLAMYDIA TRACHOMATIS, NAA: NEGATIVE
Candida albicans, NAA: NEGATIVE
Candida glabrata, NAA: NEGATIVE
Candida krusei, NAA: NEGATIVE
Candida lusitaniae, NAA: NEGATIVE
Candida tropicalis, NAA: NEGATIVE
NEISSERIA GONORRHOEAE, NAA: NEGATIVE
TRICH VAG BY NAA: NEGATIVE

## 2016-05-31 LAB — URINE CULTURE, OB REFLEX

## 2016-05-31 LAB — CULTURE, OB URINE

## 2016-06-03 LAB — HEMOGLOBINOPATHY EVALUATION
HEMOGLOBIN A2 QUANTITATION: 2.6 % (ref 0.7–3.1)
HGB A: 97.4 % (ref 94.0–98.0)
HGB C: 0 %
HGB S: 0 %
Hemoglobin F Quantitation: 0 % (ref 0.0–2.0)

## 2016-06-03 LAB — PRENATAL PROFILE I(LABCORP)
Antibody Screen: NEGATIVE
BASOS ABS: 0 10*3/uL (ref 0.0–0.2)
Basos: 0 %
EOS (ABSOLUTE): 0.1 10*3/uL (ref 0.0–0.4)
EOS: 1 %
HEP B S AG: NEGATIVE
Hematocrit: 39.7 % (ref 34.0–46.6)
Hemoglobin: 12.6 g/dL (ref 11.1–15.9)
IMMATURE GRANULOCYTES: 0 %
Immature Grans (Abs): 0 10*3/uL (ref 0.0–0.1)
Lymphocytes Absolute: 2.2 10*3/uL (ref 0.7–3.1)
Lymphs: 14 %
MCH: 27.7 pg (ref 26.6–33.0)
MCHC: 31.7 g/dL (ref 31.5–35.7)
MCV: 87 fL (ref 79–97)
MONOCYTES: 4 %
Monocytes Absolute: 0.7 10*3/uL (ref 0.1–0.9)
NEUTROS PCT: 81 %
Neutrophils Absolute: 12.8 10*3/uL — ABNORMAL HIGH (ref 1.4–7.0)
PLATELETS: 293 10*3/uL (ref 150–379)
RBC: 4.55 x10E6/uL (ref 3.77–5.28)
RDW: 13.7 % (ref 12.3–15.4)
RH TYPE: POSITIVE
RPR Ser Ql: NONREACTIVE
Rubella Antibodies, IGG: 2.15 index (ref >0.99)
WBC: 15.9 10*3/uL — ABNORMAL HIGH (ref 3.4–10.8)

## 2016-06-03 LAB — CYSTIC FIBROSIS MUTATION 97: GENE DIS ANAL CARRIER INTERP BLD/T-IMP: NOT DETECTED

## 2016-06-03 LAB — TOXASSURE SELECT 13 (MW), URINE

## 2016-06-03 LAB — HIV ANTIBODY (ROUTINE TESTING W REFLEX): HIV Screen 4th Generation wRfx: NONREACTIVE

## 2016-06-04 ENCOUNTER — Encounter (HOSPITAL_COMMUNITY): Payer: Self-pay | Admitting: Family Medicine

## 2016-06-05 ENCOUNTER — Telehealth: Payer: Self-pay | Admitting: *Deleted

## 2016-06-05 NOTE — Telephone Encounter (Signed)
Patient made aware of lab result and medication called to her pharmacy.Patient has a cold,explained to her what medications and pregnant patient can take and called her in some Zyrtec 10 mg po once daily.

## 2016-06-05 NOTE — Telephone Encounter (Signed)
Patient made aware of lab results and medication [phone].

## 2016-06-05 NOTE — Telephone Encounter (Signed)
Voicemail message left for patient to call office for lab results.

## 2016-06-13 ENCOUNTER — Encounter (HOSPITAL_COMMUNITY): Payer: Self-pay

## 2016-06-13 ENCOUNTER — Ambulatory Visit (HOSPITAL_COMMUNITY)
Admission: RE | Admit: 2016-06-13 | Discharge: 2016-06-13 | Disposition: A | Discharge status: Home / Self Care | Payer: Medicaid Other | Source: Ambulatory Visit | Attending: Family Medicine | Admitting: Family Medicine

## 2016-06-13 DIAGNOSIS — Z3682 Encounter for antenatal screening for nuchal translucency: Secondary | ICD-10-CM | POA: Insufficient documentation

## 2016-06-13 DIAGNOSIS — Z3A12 12 weeks gestation of pregnancy: Secondary | ICD-10-CM | POA: Diagnosis not present

## 2016-06-13 DIAGNOSIS — Z3490 Encounter for supervision of normal pregnancy, unspecified, unspecified trimester: Secondary | ICD-10-CM

## 2016-06-19 ENCOUNTER — Telehealth: Payer: Self-pay

## 2016-06-19 DIAGNOSIS — R519 Headache, unspecified: Secondary | ICD-10-CM

## 2016-06-19 DIAGNOSIS — R51 Headache: Principal | ICD-10-CM

## 2016-06-19 MED ORDER — BUTALBITAL-APAP-CAFFEINE 50-325-40 MG PO TABS: 1.0000 | ORAL_TABLET | Freq: Four times a day (QID) | ORAL | 0 refills | Status: DC | PRN

## 2016-06-19 NOTE — Telephone Encounter (Signed)
S/w patient about HA and informed that rx was sent, pt stated that she would follow up on appt on 06-24-16.

## 2016-06-24 ENCOUNTER — Encounter: Payer: Self-pay | Admitting: Obstetrics and Gynecology

## 2016-06-27 ENCOUNTER — Other Ambulatory Visit (HOSPITAL_COMMUNITY): Payer: Self-pay

## 2016-07-02 ENCOUNTER — Ambulatory Visit (INDEPENDENT_AMBULATORY_CARE_PROVIDER_SITE_OTHER): Payer: Medicaid Other | Admitting: Obstetrics & Gynecology

## 2016-07-02 VITALS — BP 128/81 | HR 89 | Temp 99.4°F | Wt 238.8 lb

## 2016-07-02 DIAGNOSIS — Z3480 Encounter for supervision of other normal pregnancy, unspecified trimester: Secondary | ICD-10-CM

## 2016-07-02 DIAGNOSIS — Z3482 Encounter for supervision of other normal pregnancy, second trimester: Secondary | ICD-10-CM

## 2016-07-02 DIAGNOSIS — Z3481 Encounter for supervision of other normal pregnancy, first trimester: Secondary | ICD-10-CM

## 2016-07-02 NOTE — Progress Notes (Signed)
Patient is in the office states that headaches are not as bad, and reports fetal movement.

## 2016-07-02 NOTE — Progress Notes (Signed)
   PRENATAL VISIT NOTE  Subjective:  Lorraine Nguyen is a 30 y.o. G3P2002 at 3329w0d being seen today for ongoing prenatal care.  She is currently monitored for the following issues for this low-risk pregnancy and has Supervision of normal pregnancy, antepartum and Group B streptococcal bacteriuria on her problem list.  Patient reports no complaints.  Contractions: Not present. Vag. Bleeding: None.  Movement: Present. Denies leaking of fluid.   The following portions of the patient's history were reviewed and updated as appropriate: allergies, current medications, past family history, past medical history, past social history, past surgical history and problem list. Problem list updated.  Objective:   Vitals:   07/02/16 1132  BP: 128/81  Pulse: 89  Temp: 99.4 F (37.4 C)  Weight: 238 lb 12.8 oz (108.3 kg)    Fetal Status: Fetal Heart Rate (bpm): 135   Movement: Present     General:  Alert, oriented and cooperative. Patient is in no acute distress.  Skin: Skin is warm and dry. No rash noted.   Cardiovascular: Normal heart rate noted  Respiratory: Normal respiratory effort, no problems with respiration noted  Abdomen: Soft, gravid, appropriate for gestational age. Pain/Pressure: Present     Pelvic:  Cervical exam deferred        Extremities: Normal range of motion.  Edema: None  Mental Status: Normal mood and affect. Normal behavior. Normal judgment and thought content.   Assessment and Plan:  Pregnancy: G3P2002 at 3129w0d  1. Supervision of other normal pregnancy, antepartum NT was nl, AFP today - US OB Comp + 14 Wk; Future - AFP, Quad Screen  Preterm labor symptoms and general obstetric precautions including but not limited to vaginal bleeding, contractions, leaking of fluid and fetal movement were reviewed in detail with the patient. Please refer to After Visit Summary for other counseling recommendations.  Return in about 4 weeks (around 07/30/2016).  Adam PhenixJames G Arnold,  MD

## 2016-07-09 LAB — AFP, QUAD SCREEN
DIA MOM VALUE: 1.72
DIA Value (EIA): 254.93 pg/mL
DSR (BY AGE) 1 IN: 789
DSR (Second Trimester) 1 IN: 148
Gestational Age: 15 WEEKS
MATERNAL AGE AT EDD: 28.9 a
MSAFP Mom: 0.68
MSAFP: 15.7 ng/mL
MSHCG Mom: 2.1
MSHCG: 84488 m[IU]/mL
OSB RISK: 10000
T18 (By Age): 1:3074 {titer}
Test Results:: POSITIVE — AB
UE3 VALUE: 0.41 ng/mL
Weight: 238 [lb_av]
uE3 Mom: 0.78

## 2016-07-25 ENCOUNTER — Other Ambulatory Visit: Payer: Medicaid Other

## 2016-07-29 ENCOUNTER — Encounter: Payer: Medicaid Other | Admitting: Obstetrics and Gynecology

## 2016-07-30 ENCOUNTER — Ambulatory Visit (INDEPENDENT_AMBULATORY_CARE_PROVIDER_SITE_OTHER): Payer: Medicaid Other

## 2016-07-30 ENCOUNTER — Ambulatory Visit (INDEPENDENT_AMBULATORY_CARE_PROVIDER_SITE_OTHER): Payer: Medicaid Other | Admitting: Obstetrics & Gynecology

## 2016-07-30 DIAGNOSIS — Z3480 Encounter for supervision of other normal pregnancy, unspecified trimester: Secondary | ICD-10-CM

## 2016-07-30 DIAGNOSIS — Z36 Encounter for antenatal screening for chromosomal anomalies: Secondary | ICD-10-CM

## 2016-07-30 DIAGNOSIS — Z3482 Encounter for supervision of other normal pregnancy, second trimester: Secondary | ICD-10-CM

## 2016-07-30 MED ORDER — COMPLETENATE 29-1 MG PO CHEW: 1.0000 | CHEWABLE_TABLET | Freq: Every day | ORAL | 5 refills | Status: DC

## 2016-07-30 NOTE — Progress Notes (Signed)
Discussed abnormal Quad screen after nl first screen and US today. She does not want any other testing.    PRENATAL VISIT NOTE  Subjective:  Lorraine Nguyen is a 30 y.o. G3P2002 at 4024w0d being seen today for ongoing prenatal care.  She is currently monitored for the following issues for this low-risk pregnancy and has Supervision of normal pregnancy, antepartum and Group B streptococcal bacteriuria on her problem list.  Patient reports no complaints.  Contractions: Not present. Vag. Bleeding: None.  Movement: Present. Denies leaking of fluid.   The following portions of the patient's history were reviewed and updated as appropriate: allergies, current medications, past family history, past medical history, past social history, past surgical history and problem list. Problem list updated.  Objective:   Vitals:   07/30/16 1430  BP: 128/84  Pulse: (!) 103  Temp: 98.1 F (36.7 C)  Weight: 239 lb 6.4 oz (108.6 kg)    Fetal Status:     Movement: Present     General:  Alert, oriented and cooperative. Patient is in no acute distress.  Skin: Skin is warm and dry. No rash noted.   Cardiovascular: Normal heart rate noted  Respiratory: Normal respiratory effort, no problems with respiration noted  Abdomen: Soft, gravid, appropriate for gestational age. Pain/Pressure: Present     Pelvic:  Cervical exam deferred        Extremities: Normal range of motion.  Edema: Trace  Mental Status: Normal mood and affect. Normal behavior. Normal judgment and thought content.   Assessment and Plan:  Pregnancy: G3P2002 at 3724w0d  1. Supervision of other normal pregnancy, antepartum Wants chewable prenatal - prenatal vitamin w/FE, FA (NATACHEW) 29-1 MG CHEW chewable tablet; Chew 1 tablet by mouth daily at 12 noon.  Dispense: 30 tablet; Refill: 5  Preterm labor symptoms and general obstetric precautions including but not limited to vaginal bleeding, contractions, leaking of fluid and fetal movement were  reviewed in detail with the patient. Please refer to After Visit Summary for other counseling recommendations.  Return in about 4 weeks (around 08/27/2016).   Adam PhenixJames G Argenis Kumari, MD

## 2016-07-30 NOTE — Patient Instructions (Signed)
Second Trimester of Pregnancy The second trimester is from week 13 through week 28 (months 4 through 6). The second trimester is often a time when you feel your best. Your body has also adjusted to being pregnant, and you begin to feel better physically. Usually, morning sickness has lessened or quit completely, you may have more energy, and you may have an increase in appetite. The second trimester is also a time when the fetus is growing rapidly. At the end of the sixth month, the fetus is about 9 inches long and weighs about 1 pounds. You will likely begin to feel the baby move (quickening) between 18 and 20 weeks of the pregnancy. Body changes during your second trimester Your body continues to go through many changes during your second trimester. The changes vary from woman to woman.  Your weight will continue to increase. You will notice your lower abdomen bulging out.  You may begin to get stretch marks on your hips, abdomen, and breasts.  You may develop headaches that can be relieved by medicines. The medicines should be approved by your health care provider.  You may urinate more often because the fetus is pressing on your bladder.  You may develop or continue to have heartburn as a result of your pregnancy.  You may develop constipation because certain hormones are causing the muscles that push waste through your intestines to slow down.  You may develop hemorrhoids or swollen, bulging veins (varicose veins).  You may have back pain. This is caused by:  Weight gain.  Pregnancy hormones that are relaxing the joints in your pelvis.  A shift in weight and the muscles that support your balance.  Your breasts will continue to grow and they will continue to become tender.  Your gums may bleed and may be sensitive to brushing and flossing.  Dark spots or blotches (chloasma, mask of pregnancy) may develop on your face. This will likely fade after the baby is born.  A dark line  from your belly button to the pubic area (linea nigra) may appear. This will likely fade after the baby is born.  You may have changes in your hair. These can include thickening of your hair, rapid growth, and changes in texture. Some women also have hair loss during or after pregnancy, or hair that feels dry or thin. Your hair will most likely return to normal after your baby is born. What to expect at prenatal visits During a routine prenatal visit:  You will be weighed to make sure you and the fetus are growing normally.  Your blood pressure will be taken.  Your abdomen will be measured to track your baby's growth.  The fetal heartbeat will be listened to.  Any test results from the previous visit will be discussed. Your health care provider may ask you:  How you are feeling.  If you are feeling the baby move.  If you have had any abnormal symptoms, such as leaking fluid, bleeding, severe headaches, or abdominal cramping.  If you are using any tobacco products, including cigarettes, chewing tobacco, and electronic cigarettes.  If you have any questions. Other tests that may be performed during your second trimester include:  Blood tests that check for:  Low iron levels (anemia).  Gestational diabetes (between 24 and 28 weeks).  Rh antibodies. This is to check for a protein on red blood cells (Rh factor).  Urine tests to check for infections, diabetes, or protein in the urine.  An ultrasound to   confirm the proper growth and development of the baby.  An amniocentesis to check for possible genetic problems.  Fetal screens for spina bifida and Down syndrome.  HIV (human immunodeficiency virus) testing. Routine prenatal testing includes screening for HIV, unless you choose not to have this test. Follow these instructions at home: Eating and drinking  Continue to eat regular, healthy meals.  Avoid raw meat, uncooked cheese, cat litter boxes, and soil used by cats. These  carry germs that can cause birth defects in the baby.  Take your prenatal vitamins.  Take 1500-2000 mg of calcium daily starting at the 20th week of pregnancy until you deliver your baby.  If you develop constipation:  Take over-the-counter or prescription medicines.  Drink enough fluid to keep your urine clear or pale yellow.  Eat foods that are high in fiber, such as fresh fruits and vegetables, whole grains, and beans.  Limit foods that are high in fat and processed sugars, such as fried and sweet foods. Activity  Exercise only as directed by your health care provider. Experiencing uterine cramps is a good sign to stop exercising.  Avoid heavy lifting, wear low heel shoes, and practice good posture.  Wear your seat belt at all times when driving.  Rest with your legs elevated if you have leg cramps or low back pain.  Wear a good support bra for breast tenderness.  Do not use hot tubs, steam rooms, or saunas. Lifestyle  Avoid all smoking, herbs, alcohol, and unprescribed drugs. These chemicals affect the formation and growth of the baby.  Do not use any products that contain nicotine or tobacco, such as cigarettes and e-cigarettes. If you need help quitting, ask your health care provider.  A sexual relationship may be continued unless your health care provider directs you otherwise. General instructions  Follow your health care provider's instructions regarding medicine use. There are medicines that are either safe or unsafe to take during pregnancy.  Take warm sitz baths to soothe any pain or discomfort caused by hemorrhoids. Use hemorrhoid cream if your health care provider approves.  If you develop varicose veins, wear support hose. Elevate your feet for 15 minutes, 3-4 times a day. Limit salt in your diet.  Visit your dentist if you have not gone yet during your pregnancy. Use a soft toothbrush to brush your teeth and be gentle when you floss.  Keep all follow-up  prenatal visits as told by your health care provider. This is important. Contact a health care provider if:  You have dizziness.  You have mild pelvic cramps, pelvic pressure, or nagging pain in the abdominal area.  You have persistent nausea, vomiting, or diarrhea.  You have a bad smelling vaginal discharge.  You have pain with urination. Get help right away if:  You have a fever.  You are leaking fluid from your vagina.  You have spotting or bleeding from your vagina.  You have severe abdominal cramping or pain.  You have rapid weight gain or weight loss.  You have shortness of breath with chest pain.  You notice sudden or extreme swelling of your face, hands, ankles, feet, or legs.  You have not felt your baby move in over an hour.  You have severe headaches that do not go away with medicine.  You have vision changes. Summary  The second trimester is from week 13 through week 28 (months 4 through 6). It is also a time when the fetus is growing rapidly.  Your body goes   through many changes during pregnancy. The changes vary from woman to woman.  Avoid all smoking, herbs, alcohol, and unprescribed drugs. These chemicals affect the formation and growth your baby.  Do not use any tobacco products, such as cigarettes, chewing tobacco, and e-cigarettes. If you need help quitting, ask your health care provider.  Contact your health care provider if you have any questions. Keep all prenatal visits as told by your health care provider. This is important. This information is not intended to replace advice given to you by your health care provider. Make sure you discuss any questions you have with your health care provider. Document Released: 08/20/2001 Document Revised: 02/01/2016 Document Reviewed: 10/27/2012 Elsevier Interactive Patient Education  2017 [employer]  

## 2016-08-27 ENCOUNTER — Ambulatory Visit (INDEPENDENT_AMBULATORY_CARE_PROVIDER_SITE_OTHER): Payer: Medicaid Other | Admitting: Certified Nurse Midwife

## 2016-08-27 VITALS — BP 116/77 | HR 109 | Wt 245.0 lb

## 2016-08-27 DIAGNOSIS — Z3480 Encounter for supervision of other normal pregnancy, unspecified trimester: Secondary | ICD-10-CM

## 2016-08-27 DIAGNOSIS — R8271 Bacteriuria: Secondary | ICD-10-CM

## 2016-08-27 NOTE — Patient Instructions (Addendum)
Second Trimester of Pregnancy The second trimester is from week 13 through week 28 (months 4 through 6). The second trimester is often a time when you feel your best. Your body has also adjusted to being pregnant, and you begin to feel better physically. Usually, morning sickness has lessened or quit completely, you may have more energy, and you may have an increase in appetite. The second trimester is also a time when the fetus is growing rapidly. At the end of the sixth month, the fetus is about 9 inches long and weighs about 1 pounds. You will likely begin to feel the baby move (quickening) between 18 and 20 weeks of the pregnancy. Body changes during your second trimester Your body continues to go through many changes during your second trimester. The changes vary from woman to woman.  Your weight will continue to increase. You will notice your lower abdomen bulging out.  You may begin to get stretch marks on your hips, abdomen, and breasts.  You may develop headaches that can be relieved by medicines. The medicines should be approved by your health care provider.  You may urinate more often because the fetus is pressing on your bladder.  You may develop or continue to have heartburn as a result of your pregnancy.  You may develop constipation because certain hormones are causing the muscles that push waste through your intestines to slow down.  You may develop hemorrhoids or swollen, bulging veins (varicose veins).  You may have back pain. This is caused by:  Weight gain.  Pregnancy hormones that are relaxing the joints in your pelvis.  A shift in weight and the muscles that support your balance.  Your breasts will continue to grow and they will continue to become tender.  Your gums may bleed and may be sensitive to brushing and flossing.  Dark spots or blotches (chloasma, mask of pregnancy) may develop on your face. This will likely fade after the baby is born.  A dark line  from your belly button to the pubic area (linea nigra) may appear. This will likely fade after the baby is born.  You may have changes in your hair. These can include thickening of your hair, rapid growth, and changes in texture. Some women also have hair loss during or after pregnancy, or hair that feels dry or thin. Your hair will most likely return to normal after your baby is born. What to expect at prenatal visits During a routine prenatal visit:  You will be weighed to make sure you and the fetus are growing normally.  Your blood pressure will be taken.  Your abdomen will be measured to track your baby's growth.  The fetal heartbeat will be listened to.  Any test results from the previous visit will be discussed. Your health care provider may ask you:  How you are feeling.  If you are feeling the baby move.  If you have had any abnormal symptoms, such as leaking fluid, bleeding, severe headaches, or abdominal cramping.  If you are using any tobacco products, including cigarettes, chewing tobacco, and electronic cigarettes.  If you have any questions. Other tests that may be performed during your second trimester include:  Blood tests that check for:  Low iron levels (anemia).  Gestational diabetes (between 24 and 28 weeks).  Rh antibodies. This is to check for a protein on red blood cells (Rh factor).  Urine tests to check for infections, diabetes, or protein in the urine.  An ultrasound to   confirm the proper growth and development of the baby.  An amniocentesis to check for possible genetic problems.  Fetal screens for spina bifida and Down syndrome.  HIV (human immunodeficiency virus) testing. Routine prenatal testing includes screening for HIV, unless you choose not to have this test. Follow these instructions at home: Eating and drinking  Continue to eat regular, healthy meals.  Avoid raw meat, uncooked cheese, cat litter boxes, and soil used by cats. These  carry germs that can cause birth defects in the baby.  Take your prenatal vitamins.  Take 1500-2000 mg of calcium daily starting at the 20th week of pregnancy until you deliver your baby.  If you develop constipation:  Take over-the-counter or prescription medicines.  Drink enough fluid to keep your urine clear or pale yellow.  Eat foods that are high in fiber, such as fresh fruits and vegetables, whole grains, and beans.  Limit foods that are high in fat and processed sugars, such as fried and sweet foods. Activity  Exercise only as directed by your health care provider. Experiencing uterine cramps is a good sign to stop exercising.  Avoid heavy lifting, wear low heel shoes, and practice good posture.  Wear your seat belt at all times when driving.  Rest with your legs elevated if you have leg cramps or low back pain.  Wear a good support bra for breast tenderness.  Do not use hot tubs, steam rooms, or saunas. Lifestyle  Avoid all smoking, herbs, alcohol, and unprescribed drugs. These chemicals affect the formation and growth of the baby.  Do not use any products that contain nicotine or tobacco, such as cigarettes and e-cigarettes. If you need help quitting, ask your health care provider.  A sexual relationship may be continued unless your health care provider directs you otherwise. General instructions  Follow your health care provider's instructions regarding medicine use. There are medicines that are either safe or unsafe to take during pregnancy.  Take warm sitz baths to soothe any pain or discomfort caused by hemorrhoids. Use hemorrhoid cream if your health care provider approves.  If you develop varicose veins, wear support hose. Elevate your feet for 15 minutes, 3-4 times a day. Limit salt in your diet.  Visit your dentist if you have not gone yet during your pregnancy. Use a soft toothbrush to brush your teeth and be gentle when you floss.  Keep all follow-up  prenatal visits as told by your health care provider. This is important. Contact a health care provider if:  You have dizziness.  You have mild pelvic cramps, pelvic pressure, or nagging pain in the abdominal area.  You have persistent nausea, vomiting, or diarrhea.  You have a bad smelling vaginal discharge.  You have pain with urination. Get help right away if:  You have a fever.  You are leaking fluid from your vagina.  You have spotting or bleeding from your vagina.  You have severe abdominal cramping or pain.  You have rapid weight gain or weight loss.  You have shortness of breath with chest pain.  You notice sudden or extreme swelling of your face, hands, ankles, feet, or legs.  You have not felt your baby move in over an hour.  You have severe headaches that do not go away with medicine.  You have vision changes. Summary  The second trimester is from week 13 through week 28 (months 4 through 6). It is also a time when the fetus is growing rapidly.  Your body goes   through many changes during pregnancy. The changes vary from woman to woman.  Avoid all smoking, herbs, alcohol, and unprescribed drugs. These chemicals affect the formation and growth your baby.  Do not use any tobacco products, such as cigarettes, chewing tobacco, and e-cigarettes. If you need help quitting, ask your health care provider.  Contact your health care provider if you have any questions. Keep all prenatal visits as told by your health care provider. This is important. This information is not intended to replace advice given to you by your health care provider. Make sure you discuss any questions you have with your health care provider. Document Released: 08/20/2001 Document Revised: 02/01/2016 Document Reviewed: 10/27/2012 Elsevier Interactive Patient Education  2017 [employer]  

## 2016-08-27 NOTE — Progress Notes (Signed)
Subjective:    Secundino GingerLenita S Mix is a 30 y.o. female being seen today for her obstetrical visit. She is at 7851w0d gestation. Patient reports: no complaints . Fetal movement: normal.  Problem List Items Addressed This Visit      Other   Supervision of normal pregnancy, antepartum   Relevant Orders   US OB Follow Up   Group B streptococcal bacteriuria - Primary     Patient Active Problem List   Diagnosis Date Noted  . Group B streptococcal bacteriuria 05/31/2016  . Supervision of normal pregnancy, antepartum 05/26/2016   Objective:    BP 116/77   Pulse (!) 109   Wt 245 lb (111.1 kg)   LMP 03/19/2016 (Exact Date)   BMI 31.46 kg/m  FHT: 143 BPM  Uterine Size: 23 cm and size equals dates     Assessment:    Pregnancy @ 4151w0d    Doing well  Plan:    OBGCT: discussed and ordered for next visit. Signs and symptoms of preterm labor: discussed and handout given.  Labs, problem list reviewed and updated 2 hr GTT planned Follow up in 4 weeks.

## 2016-08-28 ENCOUNTER — Other Ambulatory Visit: Payer: Self-pay | Admitting: Certified Nurse Midwife

## 2016-09-09 NOTE — L&D Delivery Note (Signed)
Patient is a 31 y/o not G3P3 with SOL and SROM. Patient was a A2GDM controlled on glyburide. Patient delviered precipitously with nurse in room. I arrived with baby delivering with nursing assistance into bed.  Delivery Note At 10:03 AM a viable female was delivered via  (Presentation: ROA; Vertex  ).  APGAR: 9 ,9 ; weight  pending.   Placenta status: delivered intact with gentle traction.  Cord:  3 vessel with the following complications: none .  Cord pH: none  Anesthesia:  noen Episiotomy: None Lacerations: 1st degree;Periurethral;Labial Est. Blood Loss (mL): 100  Mom to postpartum.  Baby to Couplet care / Skin to Skin.  Lorraine Nguyen 12/11/2016, 10:19 AM

## 2016-09-24 ENCOUNTER — Ambulatory Visit (INDEPENDENT_AMBULATORY_CARE_PROVIDER_SITE_OTHER): Payer: Medicaid Other | Admitting: Certified Nurse Midwife

## 2016-09-24 ENCOUNTER — Other Ambulatory Visit: Payer: Medicaid Other

## 2016-09-24 ENCOUNTER — Ambulatory Visit (INDEPENDENT_AMBULATORY_CARE_PROVIDER_SITE_OTHER): Payer: Medicaid Other

## 2016-09-24 VITALS — BP 132/82 | HR 108 | Wt 250.0 lb

## 2016-09-24 DIAGNOSIS — Z3480 Encounter for supervision of other normal pregnancy, unspecified trimester: Secondary | ICD-10-CM

## 2016-09-24 DIAGNOSIS — Z3482 Encounter for supervision of other normal pregnancy, second trimester: Secondary | ICD-10-CM

## 2016-09-24 DIAGNOSIS — R8271 Bacteriuria: Secondary | ICD-10-CM

## 2016-09-24 DIAGNOSIS — R772 Abnormality of alphafetoprotein: Secondary | ICD-10-CM | POA: Insufficient documentation

## 2016-09-24 HISTORY — DX: Abnormality of alphafetoprotein: R77.2

## 2016-09-24 NOTE — Progress Notes (Signed)
Patient is doing well today.  

## 2016-09-24 NOTE — Progress Notes (Signed)
   PRENATAL VISIT NOTE  Subjective:  Lorraine Nguyen is a 31 y.o. G3P2002 at 2467w0d being seen today for ongoing prenatal care.  She is currently monitored for the following issues for this low-risk pregnancy and has Supervision of normal pregnancy, antepartum; Group B streptococcal bacteriuria; and Elevated AFP on her problem list.  Patient reports no complaints.  Contractions: Not present. Vag. Bleeding: None.  Movement: Present. Denies leaking of fluid.   The following portions of the patient's history were reviewed and updated as appropriate: allergies, current medications, past family history, past medical history, past social history, past surgical history and problem list. Problem list updated.  Objective:   Vitals:   09/24/16 0914  BP: 132/82  Pulse: (!) 108  Weight: 250 lb (113.4 kg)    Fetal Status: Fetal Heart Rate (bpm): 158 Fundal Height: 25 cm Movement: Present     General:  Alert, oriented and cooperative. Patient is in no acute distress.  Skin: Skin is warm and dry. No rash noted.   Cardiovascular: Normal heart rate noted  Respiratory: Normal respiratory effort, no problems with respiration noted  Abdomen: Soft, gravid, appropriate for gestational age. Pain/Pressure: Present     Pelvic:  Cervical exam deferred        Extremities: Normal range of motion.  Edema: None  Mental Status: Normal mood and affect. Normal behavior. Normal judgment and thought content.   Assessment and Plan:  Pregnancy: G3P2002 at 2067w0d  1. Supervision of other normal pregnancy, antepartum      S<D.         Was scheduled for 2 hour OGTT today, but ate this AM.  Rescheduled for next Tuesday.      Dietary restrictions discussed, patient and partner verbalized understanding.  2. Group B streptococcal bacteriuria     Antibiotics during labor  3. Elevated AFP     F/U US here today in office for S<D, elevated AFP.    Preterm labor symptoms and general obstetric precautions including but  not limited to vaginal bleeding, contractions, leaking of fluid and fetal movement were reviewed in detail with the patient. Please refer to After Visit Summary for other counseling recommendations.  Return in about 2 weeks (around 10/08/2016) for ROB/ needs 2 hour testing scheduled?Tuesday.   Roe Coombsachelle A Elihu Milstein, CNM

## 2016-09-26 ENCOUNTER — Other Ambulatory Visit: Payer: Self-pay | Admitting: Certified Nurse Midwife

## 2016-09-26 DIAGNOSIS — Z3480 Encounter for supervision of other normal pregnancy, unspecified trimester: Secondary | ICD-10-CM

## 2016-10-01 ENCOUNTER — Other Ambulatory Visit: Payer: Medicaid Other

## 2016-10-01 DIAGNOSIS — Z3490 Encounter for supervision of normal pregnancy, unspecified, unspecified trimester: Secondary | ICD-10-CM

## 2016-10-02 LAB — CBC
HEMOGLOBIN: 11.9 g/dL (ref 11.1–15.9)
Hematocrit: 36.6 % (ref 34.0–46.6)
MCH: 27.9 pg (ref 26.6–33.0)
MCHC: 32.5 g/dL (ref 31.5–35.7)
MCV: 86 fL (ref 79–97)
Platelets: 286 10*3/uL (ref 150–379)
RBC: 4.26 x10E6/uL (ref 3.77–5.28)
RDW: 14 % (ref 12.3–15.4)
WBC: 14.4 10*3/uL — ABNORMAL HIGH (ref 3.4–10.8)

## 2016-10-02 LAB — GLUCOSE TOLERANCE, 2 HOURS W/ 1HR
GLUCOSE, 1 HOUR: 180 mg/dL — AB (ref 65–179)
GLUCOSE, 2 HOUR: 139 mg/dL (ref 65–152)
GLUCOSE, FASTING: 100 mg/dL — AB (ref 65–91)

## 2016-10-02 LAB — HIV ANTIBODY (ROUTINE TESTING W REFLEX): HIV SCREEN 4TH GENERATION: NONREACTIVE

## 2016-10-02 LAB — RPR: RPR Ser Ql: NONREACTIVE

## 2016-10-09 ENCOUNTER — Telehealth: Payer: Self-pay

## 2016-10-09 ENCOUNTER — Ambulatory Visit (INDEPENDENT_AMBULATORY_CARE_PROVIDER_SITE_OTHER): Payer: Medicaid Other | Admitting: Obstetrics and Gynecology

## 2016-10-09 VITALS — BP 126/79 | HR 108 | Wt 252.0 lb

## 2016-10-09 DIAGNOSIS — R8271 Bacteriuria: Secondary | ICD-10-CM

## 2016-10-09 DIAGNOSIS — O24419 Gestational diabetes mellitus in pregnancy, unspecified control: Secondary | ICD-10-CM | POA: Insufficient documentation

## 2016-10-09 DIAGNOSIS — O24410 Gestational diabetes mellitus in pregnancy, diet controlled: Secondary | ICD-10-CM

## 2016-10-09 DIAGNOSIS — Z3480 Encounter for supervision of other normal pregnancy, unspecified trimester: Secondary | ICD-10-CM

## 2016-10-09 DIAGNOSIS — R772 Abnormality of alphafetoprotein: Secondary | ICD-10-CM

## 2016-10-09 MED ORDER — ACCU-CHEK NANO SMARTVIEW W/DEVICE KIT: 1.0000 | PACK | Freq: Four times a day (QID) | 0 refills | Status: DC

## 2016-10-09 MED ORDER — ACCU-CHEK FASTCLIX LANCET KIT: 1.0000 | PACK | Freq: Four times a day (QID) | 10 refills | Status: DC

## 2016-10-09 MED ORDER — GLUCOSE BLOOD VI STRP: ORAL_STRIP | 12 refills | Status: DC

## 2016-10-09 NOTE — Telephone Encounter (Signed)
Contacted patient and advised that diabetic supplies and referral was sent.

## 2016-10-09 NOTE — Progress Notes (Signed)
Subjective:  Lorraine Nguyen is a 31 y.o. G3P2002 at 6073w1d being seen today for ongoing prenatal care.  She is currently monitored for the following issues for this high-risk pregnancy and has Supervision of normal pregnancy, antepartum; Group B streptococcal bacteriuria; Elevated AFP; and Gestational diabetes on her problem list.  Patient reports no complaints.  Contractions: Not present. Vag. Bleeding: None.  Movement: Present. Denies leaking of fluid.   The following portions of the patient's history were reviewed and updated as appropriate: allergies, current medications, past family history, past medical history, past social history, past surgical history and problem list. Problem list updated.  Objective:   Vitals:   10/09/16 1326  BP: 126/79  Pulse: (!) 108  Weight: 252 lb (114.3 kg)    Fetal Status: Fetal Heart Rate (bpm): 154   Movement: Present     General:  Alert, oriented and cooperative. Patient is in no acute distress.  Skin: Skin is warm and dry. No rash noted.   Cardiovascular: Normal heart rate noted  Respiratory: Normal respiratory effort, no problems with respiration noted  Abdomen: Soft, gravid, appropriate for gestational age. Pain/Pressure: Present     Pelvic:  Cervical exam deferred        Extremities: Normal range of motion.  Edema: None  Mental Status: Normal mood and affect. Normal behavior. Normal judgment and thought content.   Urinalysis:      Assessment and Plan:  Pregnancy: G3P2002 at 5773w1d  1. Supervision of other normal pregnancy, antepartum   2. Group B streptococcal bacteriuria Tx while in labor  3. Elevated AFP Normal U/S and follow U/S normal growth and no markers noted  4. Diet controlled gestational diabetes mellitus (GDM) in third trimester Reviewed with pt Will refer for education BS goals reviewed Check US at 32-34 weeks   Preterm labor symptoms and general obstetric precautions including but not limited to vaginal  bleeding, contractions, leaking of fluid and fetal movement were reviewed in detail with the patient. Please refer to After Visit Summary for other counseling recommendations.  Return in about 2 weeks (around 10/23/2016).   Hermina StaggersMichael L Jacquelynne Guedes, MD

## 2016-10-09 NOTE — Patient Instructions (Signed)
Gestational Diabetes Mellitus, Diagnosis Gestational diabetes (gestational diabetes mellitus) is a temporary form of diabetes that some women develop during pregnancy. It usually occurs around weeks 24-28 of pregnancy and goes away after delivery. Hormonal changes during pregnancy can interfere with insulin production and function, which may result in one or both of these problems:  The pancreas does not make enough of a hormone called insulin.  Cells in the body do not respond properly to insulin that the body makes (insulin resistance). Normally, insulin allows sugars (glucose) to enter cells in the body. The cells use glucose for energy. Insulin resistance or lack of insulin causes excess glucose to build up in the blood instead of going into cells. As a result, high blood glucose (hyperglycemia) develops. What are the risks? If gestational diabetes is treated, it is unlikely to cause problems. If it is not controlled with treatment, it may cause problems during labor and delivery, and some of those problems can be harmful to the unborn baby (fetus) and the mother. Uncontrolled gestational diabetes may also cause the newborn baby to have breathing problems and low blood glucose. Women who get gestational diabetes are more likely to develop it if they get pregnant again, and they are more likely to develop type 2 diabetes in the future. What increases the risk? This condition may be more likely to develop in pregnant women who:  Are older than age 25 during pregnancy.  Have a family history of diabetes.  Are overweight.  Had gestational diabetes in the past.  Have polycystic ovarian syndrome (PCOS).  Are pregnant with twins or multiples.  Are of American-Indian, African-American, Hispanic/Latino, or Asian/Pacific Islander descent. What are the signs or symptoms? Most women do not notice symptoms of gestational diabetes because the symptoms are similar to normal symptoms of pregnancy.  Symptoms of gestational diabetes may include:  Increased thirst (polydipsia).  Increased hunger(polyphagia).  Increased urination (polyuria). How is this diagnosed? This condition may be diagnosed based on your blood glucose level, which may be checked with one or more of the following blood tests:  A fasting blood glucose (FBG) test. You will not be allowed to eat (you will fast) for at least 8 hours before a blood sample is taken.  A random blood glucose test. This checks your blood glucose at any time of day regardless of when you ate.  An oral glucose tolerance test (OGTT). This is usually done during weeks 24-28 of pregnancy.  For this test, you will have an FBG test done. Then, you will drink a beverage that contains glucose. Your blood glucose will be tested again 1 hour after drinking the glucose beverage (1-hour OGTT).  If the 1-hour OGTT result is at or above 140 mg/dL (7.8 mmol/L), you will repeat the OGTT. This time, your blood glucose will be tested 3 hours after drinking the glucose beverage (3-hour OGTT). If you have risk factors, you may be screened for undiagnosed type 2 diabetes at your first health care visit during your pregnancy (prenatal visit). How is this treated? Your treatment may be managed by a specialist called an endocrinologist. This condition is treated by following instructions from your health care provider about:  Eating a healthier diet and getting more physical activity. These changes are the most important ways to manage gestational diabetes.  Checking your blood glucose. Do this as often as told.  Taking diabetes medicines or insulin every day. These will only be prescribed if they are needed.  If you use insulin,   you may need to adjust your dosage based on how physically active you are and what foods you eat. Your health care provider will tell you how to do this. Your health care provider will set treatment goals for you based on the stage of  your pregnancy and any other medical conditions you have. Generally, the goal of treatment is to maintain the following blood glucose levels during pregnancy:  Fasting: at or below 95 mg/dL (5.3 mmol/L).  After meals (postprandial):  One hour after a meal: at or below 140 mg/dL (7.8 mmol/L).  Two hours after a meal: at or below 120 mg/dL (6.7 mmol/L).  A1c (hemoglobin A1c) level: 6-6.5%. Follow these instructions at home: Questions to Ask Your Health Care Provider  Consider asking the following questions:  Do I need to meet with a diabetes educator?  Where can I find a support group for people with diabetes?  What equipment will I need to manage my diabetes at home?  What diabetes medicines do I need, and when should I take them?  How often do I need to check my blood glucose?  What number can I call if I have questions?  When is my next appointment? General Instructions  Take over-the-counter and prescription medicines only as told by your health care provider.  Manage your weight gain during pregnancy. The amount of weight that you are expected to gain depends on your pre-pregnancy BMI (body mass index).  Keep all follow-up visits as told by your health care provider. This is important.  For more information about diabetes, visit:  American Diabetes Association (ADA): www.diabetes.org  American Association of Diabetes Educators (AADE): www.diabeteseducator.org/patient-resources Contact a health care provider if:  Your blood glucose level is at or above 240 mg/dL (13.3 mmol/L).  Your blood glucose level is at or above 200 mg/dL (11.1 mmol/L) and you have ketones in your urine.  You have been sick or have had a fever for 2 days or more and you are not getting better.  You have any of the following problems for more than 6 hours:  You cannot eat or drink.  You have nausea and vomiting.  You have diarrhea. Get help right away if:  Your blood glucose is below 54  mg/dL (3 mmol/L).  You become confused or you have trouble thinking clearly.  You have difficulty breathing.  You have moderate or large ketone levels in your urine.  Your baby is moving around less than usual.  You develop unusual discharge or bleeding from your vagina.  You start having contractions early (prematurely). Contractions may feel like a tightening in your lower abdomen. This information is not intended to replace advice given to you by your health care provider. Make sure you discuss any questions you have with your health care provider. Document Released: 12/02/2000 Document Revised: 02/01/2016 Document Reviewed: 09/29/2015 Elsevier Interactive Patient Education  2017 Elsevier Inc.  

## 2016-10-11 ENCOUNTER — Other Ambulatory Visit: Payer: Self-pay

## 2016-10-11 DIAGNOSIS — O24419 Gestational diabetes mellitus in pregnancy, unspecified control: Secondary | ICD-10-CM

## 2016-10-21 ENCOUNTER — Encounter: Payer: Medicaid Other | Attending: Certified Nurse Midwife | Admitting: Skilled Nursing Facility1

## 2016-10-21 ENCOUNTER — Encounter: Payer: Self-pay | Admitting: Skilled Nursing Facility1

## 2016-10-21 DIAGNOSIS — Z3A00 Weeks of gestation of pregnancy not specified: Secondary | ICD-10-CM | POA: Insufficient documentation

## 2016-10-21 DIAGNOSIS — Z713 Dietary counseling and surveillance: Secondary | ICD-10-CM | POA: Diagnosis not present

## 2016-10-21 DIAGNOSIS — O24419 Gestational diabetes mellitus in pregnancy, unspecified control: Secondary | ICD-10-CM | POA: Insufficient documentation

## 2016-10-21 NOTE — Progress Notes (Signed)
  Patient was seen on 10/21/16 for Gestational Diabetes self-management class at the [facility]. The following learning objectives were met by the patient during this course:   States the definition of Gestational Diabetes  States why dietary management is important in controlling blood glucose  Describes the effects each nutrient has on blood glucose levels  Demonstrates ability to create a balanced meal plan  Demonstrates carbohydrate counting   States when to check blood glucose levels involving a total of 4 separate occurences in a day  Demonstrates proper blood glucose monitoring techniques  States the effect of stress and exercise on blood glucose levels  States the importance of limiting caffeine and abstaining from alcohol and smoking  Demonstrates the knowledge the glucometer provided in class may not be covered by their insurance and to call their insurance provider immediately after class to know which glucometer their insurance provider does cover as well as calling their physician the next day for a prescription to the glucometer their insurance does cover (if the one provided is not) as well as the lancets and strips for that meter.  Blood glucose monitor given: already given one Lot  Exp:  Blood glucose reading:   Patient instructed to monitor glucose levels: FBS: 60 - <90 1 hour: <140 2 hour: <120  *Patient received handouts:  Nutrition Diabetes and Pregnancy  Carbohydrate Counting List  Patient will be seen for follow-up as needed. Bonnye Fava, RN, MSN, CDE

## 2016-10-23 ENCOUNTER — Ambulatory Visit (INDEPENDENT_AMBULATORY_CARE_PROVIDER_SITE_OTHER): Payer: Medicaid Other | Admitting: Certified Nurse Midwife

## 2016-10-23 VITALS — BP 125/74 | HR 106 | Wt 252.0 lb

## 2016-10-23 DIAGNOSIS — O24410 Gestational diabetes mellitus in pregnancy, diet controlled: Secondary | ICD-10-CM

## 2016-10-23 DIAGNOSIS — R8271 Bacteriuria: Secondary | ICD-10-CM

## 2016-10-23 DIAGNOSIS — O0990 Supervision of high risk pregnancy, unspecified, unspecified trimester: Secondary | ICD-10-CM

## 2016-10-23 NOTE — Progress Notes (Signed)
   PRENATAL VISIT NOTE  Subjective:  Lorraine Nguyen is a 31 y.o. G3P2002 at 662w1d being seen today for ongoing prenatal care.  She is currently monitored for the following issues for this high-risk pregnancy and has Supervision of high risk pregnancy, antepartum; Group B streptococcal bacteriuria; Elevated AFP; and Gestational diabetes on her problem list.  Patient reports no complaints.  Contractions: Not present. Vag. Bleeding: None.  Movement: Present. Denies leaking of fluid.   The following portions of the patient's history were reviewed and updated as appropriate: allergies, current medications, past family history, past medical history, past social history, past surgical history and problem list. Problem list updated.  Objective:   Vitals:   10/23/16 1546  BP: 125/74  Pulse: (!) 106  Weight: 252 lb (114.3 kg)    Fetal Status: Fetal Heart Rate (bpm): 147 Fundal Height: 32 cm Movement: Present     General:  Alert, oriented and cooperative. Patient is in no acute distress.  Skin: Skin is warm and dry. No rash noted.   Cardiovascular: Normal heart rate noted  Respiratory: Normal respiratory effort, no problems with respiration noted  Abdomen: Soft, gravid, appropriate for gestational age. Pain/Pressure: Present     Pelvic:  Cervical exam deferred        Extremities: Normal range of motion.     Mental Status: Normal mood and affect. Normal behavior. Normal judgment and thought content.   FBS: 91&93.  2 hour PP: 104-117.  Only a few days of CBGs recorded.  Assessment and Plan:  Pregnancy: G3P2002 at 502w1d  1. Group B streptococcal bacteriuria     PCN at delivery  2. Supervision of high risk pregnancy, antepartum      Diet controlled GDM  3. Diet controlled gestational diabetes mellitus (GDM) in third trimester     Doing well, a few days of CBGs.  Diet discussed.    Preterm labor symptoms and general obstetric precautions including but not limited to vaginal bleeding,  contractions, leaking of fluid and fetal movement were reviewed in detail with the patient. Please refer to After Visit Summary for other counseling recommendations.  [facility] about 2 [facility] (around 11[facility]06/2016[facility]   Roe Coombsachelle A Rockwell Zentz, CNM

## 2016-10-23 NOTE — Progress Notes (Signed)
Pt would like to discuss diabetic diet.

## 2016-11-04 ENCOUNTER — Inpatient Hospital Stay (HOSPITAL_COMMUNITY)
Admission: AD | Admit: 2016-11-04 | Discharge: 2016-11-04 | Disposition: A | Discharge status: Home / Self Care | Payer: Medicaid Other | Source: Ambulatory Visit | Attending: Obstetrics and Gynecology | Admitting: Obstetrics and Gynecology

## 2016-11-04 ENCOUNTER — Encounter (HOSPITAL_COMMUNITY): Payer: Self-pay

## 2016-11-04 DIAGNOSIS — Z3A32 32 weeks gestation of pregnancy: Secondary | ICD-10-CM | POA: Insufficient documentation

## 2016-11-04 DIAGNOSIS — O4703 False labor before 37 completed weeks of gestation, third trimester: Secondary | ICD-10-CM

## 2016-11-04 DIAGNOSIS — F17210 Nicotine dependence, cigarettes, uncomplicated: Secondary | ICD-10-CM | POA: Diagnosis not present

## 2016-11-04 DIAGNOSIS — O99333 Smoking (tobacco) complicating pregnancy, third trimester: Secondary | ICD-10-CM | POA: Insufficient documentation

## 2016-11-04 LAB — URINALYSIS, ROUTINE W REFLEX MICROSCOPIC
Bilirubin Urine: NEGATIVE
GLUCOSE, UA: NEGATIVE mg/dL
Hgb urine dipstick: NEGATIVE
KETONES UR: NEGATIVE mg/dL
Nitrite: NEGATIVE
PROTEIN: NEGATIVE mg/dL
Specific Gravity, Urine: 1.002 — ABNORMAL LOW (ref 1.005–1.030)
pH: 7 (ref 5.0–8.0)

## 2016-11-04 NOTE — Discharge Instructions (Signed)
. °Preterm Labor and Birth Information °The normal length of a pregnancy is 39-41 weeks. Preterm labor is when labor starts before 37 completed weeks of pregnancy. °What are the risk factors for preterm labor? °Preterm labor is more likely to occur in women who: °· Have certain infections during pregnancy such as a bladder infection, sexually transmitted infection, or infection inside the uterus (chorioamnionitis). °· Have a shorter-than-normal cervix. °· Have gone into preterm labor before. °· Have had surgery on their cervix. °· Are younger than age 17 or older than age 35. °· Are African American. °· Are pregnant with twins or multiple babies (multiple gestation). °· Take street drugs or smoke while pregnant. °· Do not gain enough weight while pregnant. °· Became pregnant shortly after having been pregnant. °What are the symptoms of preterm labor? °Symptoms of preterm labor include: °· Cramps similar to those that can happen during a menstrual period. The cramps may happen with diarrhea. °· Pain in the abdomen or lower back. °· Regular uterine contractions that may feel like tightening of the abdomen. °· A feeling of increased pressure in the pelvis. °· Increased watery or bloody mucus discharge from the vagina. °· Water breaking (ruptured amniotic sac). °Why is it important to recognize signs of preterm labor? °It is important to recognize signs of preterm labor because babies who are born prematurely may not be fully developed. This can put them at an increased risk for: °· Long-term (chronic) heart and lung problems. °· Difficulty immediately after birth with regulating body systems, including blood sugar, body temperature, heart rate, and breathing rate. °· Bleeding in the brain. °· Cerebral palsy. °· Learning difficulties. °· Death. °These risks are highest for babies who are born before 34 weeks of pregnancy. °How is preterm labor treated? °Treatment depends on the length of your pregnancy, your condition,  and the health of your baby. It may involve: °· Having a stitch (suture) placed in your cervix to prevent your cervix from opening too early (cerclage). °· Taking or being given medicines, such as: °¨ Hormone medicines. These may be given early in pregnancy to help support the pregnancy. °¨ Medicine to stop contractions. °¨ Medicines to help mature the baby’s lungs. These may be prescribed if the risk of delivery is high. °¨ Medicines to prevent your baby from developing cerebral palsy. °If the labor happens before 34 weeks of pregnancy, you may need to stay in the hospital. °What should I do if I think I am in preterm labor? °If you think that you are going into preterm labor, call your health care provider right away. °How can I prevent preterm labor in future pregnancies? °To increase your chance of having a full-term pregnancy: °· Do not use any tobacco products, such as cigarettes, chewing tobacco, and e-cigarettes. If you need help quitting, ask your health care provider. °· Do not use street drugs or medicines that have not been prescribed to you during your pregnancy. °· Talk with your health care provider before taking any herbal supplements, even if you have been taking them regularly. °· Make sure you gain a healthy amount of weight during your pregnancy. °· Watch for infection. If you think that you might have an infection, get it checked right away. °· Make sure to tell your health care provider if you have gone into preterm labor before. °This information is not intended to replace advice given to you by your health care provider. Make sure you discuss any questions you have with your   health care provider. °Document Released: 11/16/2003 Document Revised: 02/06/2016 Document Reviewed: 01/17/2016 °Elsevier Interactive Patient Education © 2017 [employer]vier Inc. ° °

## 2016-11-04 NOTE — MAU Note (Signed)
Been having contractions since 9 last night, was waking her up out of her sleep, 10-30 min. No bleeding or leaking.

## 2016-11-04 NOTE — MAU Provider Note (Signed)
History     CSN: [id number]  Arrival date and time: 11/04/16 1542   First Provider Initiated Contact with Patient 11/04/16 1619      Chief Complaint  Patient presents with  . Contractions   HPI Lorraine Nguyen is a 31 y.o. G3P2002 at 26w6dwho presents with contractions. Reports contractions every 15-30 minutes since last night after having intercourse. Rates pain 7/10 when contraction occurs. Has not treated pain. Denies vaginal bleeding or LOF. Positive fetal movement.   OB History    Gravida Para Term Preterm AB Living   _0 SAB TAB Ectopic Multiple Live Births           2      Past Medical History:  Diagnosis Date  . Medical history non-contributory     Past Surgical History:  Procedure Laterality Date  . NO PAST SURGERIES      Family History  Problem Relation Age of Onset  . Diabetes Maternal Grandmother   . Hypertension Maternal Grandmother   . Diabetes Paternal Grandmother   . Hypertension Paternal Grandfather     Social History  Substance Use Topics  . Smoking status: Current Every Day Smoker    Packs/day: 0.25    Types: Cigarettes  . Smokeless tobacco: Never Used  . Alcohol use No     Comment: socially     Allergies: No Known Allergies  Prescriptions Prior to Admission  Medication Sig Dispense Refill Last Dose  . Blood Glucose Monitoring Suppl (ACCU-CHEK NANO SMARTVIEW) w/Device KIT 1 Device by Does not apply route 4 (four) times daily. 1 kit 0   . butalbital-acetaminophen-caffeine (FIORICET, ESGIC) 50-325-40 MG tablet Take 1-2 tablets by mouth every 6 (six) hours as needed for headache. (Patient not taking: Reported on 08/27/2016) 20 tablet 0 Not Taking  . cetirizine HCl (ZYRTEC) 5 MG/5ML SYRP Take 5 mg by mouth daily.   Taking  . flintstones complete (FLINTSTONES) 60 MG chewable tablet Chew 1 tablet by mouth daily.   Taking  . glucose blood test strip Use as instructed 100 each 12   . Lancets Misc. (ACCU-CHEK FASTCLIX LANCET) KIT 1  kit by Does not apply route 4 (four) times daily. 1 kit 10   . prenatal vitamin w/FE, FA (NATACHEW) 29-1 MG CHEW chewable tablet Chew 1 tablet by mouth daily at 12 noon. (Patient not taking: Reported on 09/24/2016) 30 tablet 5 Not Taking    Review of Systems  Constitutional: Negative.   Gastrointestinal: Positive for abdominal pain. Negative for constipation, diarrhea, nausea and vomiting.  Genitourinary: Negative.    Physical Exam   Blood pressure 107/56, pulse 92, temperature 98.1 F (36.7 C), temperature source Oral, resp. rate 18, height _1  (1.88 m), weight 252 lb (114.3 kg), last menstrual period 03/19/2016, SpO2 99 %.  Physical Exam  Nursing note and vitals reviewed. Constitutional: She is oriented to person, place, and time. She appears well-developed and well-nourished. No distress.  HENT:  Head: Normocephalic and atraumatic.  Eyes: Conjunctivae are normal. Right eye exhibits no discharge. Left eye exhibits no discharge. No scleral icterus.  Neck: Normal range of motion.  Cardiovascular: Normal rate, regular rhythm and normal heart sounds.   No murmur heard. Respiratory: Effort normal and breath sounds normal. No respiratory distress. She has no wheezes.  GI: Soft. Bowel sounds are normal.  Neurological: She is alert and oriented to person, place, and time.  Skin: Skin is warm and dry. She is  not diaphoretic.  Psychiatric: She has a normal mood and affect. Her behavior is normal. Judgment and thought content normal.   Dilation: Closed Effacement (%): Thick Cervical Position: Posterior Exam by:: Connery Shiffler,np  Fetal Tracing:  Baseline: 140 Variability: moderate Accelerations: 15x15 Decelerations: none  Toco: x1 MAU Course  Procedures Results for orders placed or performed during the hospital encounter of 11/04/16 (from the past 24 hour(s))  Urinalysis, Routine w reflex microscopic     Status: Abnormal   Collection Time: 11/04/16  4:17 PM  Result Value Ref  Range   Color, Urine STRAW (A) YELLOW   APPearance CLEAR CLEAR   Specific Gravity, Urine 1.002 (L) 1.005 - 1.030   pH 7.0 5.0 - 8.0   Glucose, UA NEGATIVE NEGATIVE mg/dL   Hgb urine dipstick NEGATIVE NEGATIVE   Bilirubin Urine NEGATIVE NEGATIVE   Ketones, ur NEGATIVE NEGATIVE mg/dL   Protein, ur NEGATIVE NEGATIVE mg/dL   Nitrite NEGATIVE NEGATIVE   Leukocytes, UA TRACE (A) NEGATIVE   RBC / HPF 0-5 0 - 5 RBC/hpf   WBC, UA 0-5 0 - 5 WBC/hpf   Bacteria, UA RARE (A) NONE SEEN   Squamous Epithelial / LPF 0-5 (A) NONE SEEN   MDM Category 1 tracing TOCO -- 1 ctx noted SVE closed/thick Unable to collect FFN d/t recent intercourse  Assessment and Plan  A: 1. Preterm uterine contractions, antepartum, third trimester    P: Discharge home PTL precautions Keep f/u with OB on Wednesday  Jorje Guild 11/04/2016, 4:19 PM

## 2016-11-06 ENCOUNTER — Ambulatory Visit (INDEPENDENT_AMBULATORY_CARE_PROVIDER_SITE_OTHER): Payer: Medicaid Other | Admitting: Certified Nurse Midwife

## 2016-11-06 ENCOUNTER — Encounter: Payer: Self-pay | Admitting: Certified Nurse Midwife

## 2016-11-06 VITALS — BP 105/69 | HR 105 | Wt 252.0 lb

## 2016-11-06 DIAGNOSIS — O0990 Supervision of high risk pregnancy, unspecified, unspecified trimester: Secondary | ICD-10-CM

## 2016-11-06 DIAGNOSIS — O24415 Gestational diabetes mellitus in pregnancy, controlled by oral hypoglycemic drugs: Secondary | ICD-10-CM | POA: Diagnosis not present

## 2016-11-06 DIAGNOSIS — O24410 Gestational diabetes mellitus in pregnancy, diet controlled: Secondary | ICD-10-CM

## 2016-11-06 DIAGNOSIS — R8271 Bacteriuria: Secondary | ICD-10-CM

## 2016-11-06 MED ORDER — GLYBURIDE 2.5 MG PO TABS: 2.5000 mg | ORAL_TABLET | Freq: Two times a day (BID) | ORAL | 3 refills | Status: DC

## 2016-11-06 NOTE — Progress Notes (Signed)
   PRENATAL VISIT NOTE  Subjective:  Lorraine Nguyen is a 31 y.o. G3P2002 at 8568w1d being seen today for ongoing prenatal care.  She is currently monitored for the following issues for this high-risk pregnancy and has Supervision of high risk pregnancy, antepartum; Group B streptococcal bacteriuria; Elevated AFP; and Gestational diabetes on her problem list.  Patient reports no complaints.  Contractions: Irregular. Vag. Bleeding: None.  Movement: Present. Denies leaking of fluid.   The following portions of the patient's history were reviewed and updated as appropriate: allergies, current medications, past family history, past medical history, past social history, past surgical history and problem list. Problem list updated.  Objective:   Vitals:   11/06/16 1316  BP: 105/69  Pulse: (!) 105  Weight: 252 lb (114.3 kg)    Fetal Status: Fetal Heart Rate (bpm): NST; Reactive Fundal Height: 35 cm Movement: Present     General:  Alert, oriented and cooperative. Patient is in no acute distress.  Skin: Skin is warm and dry. No rash noted.   Cardiovascular: Normal heart rate noted  Respiratory: Normal respiratory effort, no problems with respiration noted  Abdomen: Soft, gravid, appropriate for gestational age. Pain/Pressure: Present     Pelvic:  Cervical exam deferred        Extremities: Normal range of motion.     Mental Status: Normal mood and affect. Normal behavior. Normal judgment and thought content.   Fastings: 91-112 2 hour PP: 104-148; mostly elevated  NST: reactive.  NST: + accels, no decels, moderate variability, Cat. 1 tracing. No contractions on toco.   Assessment and Plan:  Pregnancy: G3P2002 at 7668w1d  1. Supervision of high risk pregnancy, antepartum     Uncontrolled GDM with diet.  Glyburide started with consultation with Dr. Ladean Rayaonstance.   - US MFM OB DETAIL +14 WK; Future - AMB referral to maternal fetal medicine  2. Diet controlled gestational diabetes mellitus  (GDM) in third trimester     Glyburide started 2.5 mg BID  3. Group B streptococcal bacteriuria      PCN for delivery  4. Gestational diabetes mellitus (GDM) in third trimester controlled on oral hypoglycemic drug      Reactive NST - glyBURIDE (DIABETA) 2.5 MG tablet; Take 1 tablet (2.5 mg total) by mouth 2 (two) times daily with a meal.  Dispense: 60 tablet; Refill: 3  Preterm labor symptoms and general obstetric precautions including but not limited to vaginal bleeding, contractions, leaking of fluid and fetal movement were reviewed in detail with the patient. Please refer to After Visit Summary for other counseling recommendations.  [facility] (around [facility]13/2016[facility] N[facility] bi [facility].   Roe Coombsachelle A Angelus Hoopes, CNM

## 2016-11-08 ENCOUNTER — Ambulatory Visit (HOSPITAL_COMMUNITY)
Admission: RE | Admit: 2016-11-08 | Discharge: 2016-11-08 | Disposition: A | Discharge status: Home / Self Care | Payer: Medicaid Other | Source: Ambulatory Visit | Attending: Certified Nurse Midwife | Admitting: Certified Nurse Midwife

## 2016-11-08 ENCOUNTER — Encounter (HOSPITAL_COMMUNITY): Payer: Self-pay

## 2016-11-08 ENCOUNTER — Other Ambulatory Visit: Payer: Self-pay | Admitting: Certified Nurse Midwife

## 2016-11-08 DIAGNOSIS — O24415 Gestational diabetes mellitus in pregnancy, controlled by oral hypoglycemic drugs: Secondary | ICD-10-CM | POA: Insufficient documentation

## 2016-11-08 DIAGNOSIS — Z3A33 33 weeks gestation of pregnancy: Secondary | ICD-10-CM

## 2016-11-08 DIAGNOSIS — O0990 Supervision of high risk pregnancy, unspecified, unspecified trimester: Secondary | ICD-10-CM

## 2016-11-08 DIAGNOSIS — Z3689 Encounter for other specified antenatal screening: Secondary | ICD-10-CM | POA: Diagnosis present

## 2016-11-08 NOTE — Progress Notes (Signed)
Maternal Fetal Medicine Consultation  [facility]  [facility] Reason for consultation:  HPI: 31yo P2002 at 33+5 weeks with gestational diabetes. Started on glyburide 2 days ago for hyperglycemia. She is taking 2.5mg  BID. Her blood sugars have been excellent since that time, and fastings have been slightly overcontrolled, 71 yesterday and 67 today, with mild hypoglycemic symptoms. She has had no other problems with the pregnancy. Her 2 other deliveries were without complications and no GDM OB History: OB History    Gravida Para Term Preterm AB Living   3 2 2     2    SAB TAB Ectopic Multiple Live Births           2      PMH:  Past Medical History:  Diagnosis Date  . Medical history non-contributory     PSH:  Past Surgical History:  Procedure Laterality Date  . NO PAST SURGERIES     Meds: See EPIC section Allergies: See EPIC section Fh: See EPIC section Soc: See EPIC section  Review of Systems: no vaginal bleeding or cramping/contractions, no LOF, no nausea/vomiting. All other systems reviewed and are negative.  PE: See EPIC section  Please see separate document for fetal ultrasound report.  A/P: Gestational diabetes on glyburide I asked her to decrease her bedtime glyburide to 1.25mg  IF her fastings were less than 70 over the next two days. She is coming to your office twice weekly and states that someone reviews her blood sugars at that time. There is no evidence of fetal effect of hyperglycemia on [place]. The baby is larger than average, but femers and humeri are very long, expected in the children of a woman this tall (6'2"). Repeat scan in 3-4 weeks for assessment of growth      Thank you for the opportunity to be a part of the care of [name]. Please contact our office if we can be of further assistance.   I spent approximately 30 minutes with this patient with over 50% of time spent in face-to-face counseling.

## 2016-11-12 ENCOUNTER — Ambulatory Visit (INDEPENDENT_AMBULATORY_CARE_PROVIDER_SITE_OTHER): Payer: Medicaid Other | Admitting: Obstetrics and Gynecology

## 2016-11-12 VITALS — BP 126/78 | HR 108 | Wt 249.0 lb

## 2016-11-12 DIAGNOSIS — R8271 Bacteriuria: Secondary | ICD-10-CM

## 2016-11-12 DIAGNOSIS — R772 Abnormality of alphafetoprotein: Secondary | ICD-10-CM

## 2016-11-12 DIAGNOSIS — O0990 Supervision of high risk pregnancy, unspecified, unspecified trimester: Secondary | ICD-10-CM

## 2016-11-12 DIAGNOSIS — O24415 Gestational diabetes mellitus in pregnancy, controlled by oral hypoglycemic drugs: Secondary | ICD-10-CM

## 2016-11-12 NOTE — Patient Instructions (Addendum)
Third Trimester of Pregnancy The third trimester is from week 28 through week 40 (months 7 through 9). The third trimester is a time when the unborn baby (fetus) is growing rapidly. At the end of the ninth month, the fetus is about 20 inches in length and weighs 6-10 pounds. Body changes during your third trimester Your body will continue to go through many changes during pregnancy. The changes vary from woman to woman. During the third trimester:  Your weight will continue to increase. You can expect to gain 25-35 pounds (11-16 kg) by the end of the pregnancy.  You may begin to get stretch marks on your hips, abdomen, and breasts.  You may urinate more often because the fetus is moving lower into your pelvis and pressing on your bladder.  You may develop or continue to have heartburn. This is caused by increased hormones that slow down muscles in the digestive tract.  You may develop or continue to have constipation because increased hormones slow digestion and cause the muscles that push waste through your intestines to relax.  You may develop hemorrhoids. These are swollen veins (varicose veins) in the rectum that can itch or be painful.  You may develop swollen, bulging veins (varicose veins) in your legs.  You may have increased body aches in the pelvis, back, or thighs. This is due to weight gain and increased hormones that are relaxing your joints.  You may have changes in your hair. These can include thickening of your hair, rapid growth, and changes in texture. Some women also have hair loss during or after pregnancy, or hair that feels dry or thin. Your hair will most likely return to normal after your baby is born.  Your breasts will continue to grow and they will continue to become tender. A yellow fluid (colostrum) may leak from your breasts. This is the first milk you are producing for your baby.  Your belly button may stick out.  You may notice more swelling in your hands,  face, or ankles.  You may have increased tingling or numbness in your hands, arms, and legs. The skin on your belly may also feel numb.  You may feel short of breath because of your expanding uterus.  You may have more problems sleeping. This can be caused by the size of your belly, increased need to urinate, and an increase in your body's metabolism.  You may notice the fetus "dropping," or moving lower in your abdomen (lightening).  You may have increased vaginal discharge.  You may notice your joints feel loose and you may have pain around your pelvic bone.  What to expect at prenatal visits You will have prenatal exams every 2 weeks until week 36. Then you will have weekly prenatal exams. During a routine prenatal visit:  You will be weighed to make sure you and the baby are growing normally.  Your blood pressure will be taken.  Your abdomen will be measured to track your baby's growth.  The fetal heartbeat will be listened to.  Any test results from the previous visit will be discussed.  You may have a cervical check near your due date to see if your cervix has softened or thinned (effaced).  You will be tested for Group B streptococcus. This happens between 35 and 37 weeks.  Your health care provider may ask you:  What your birth plan is.  How you are feeling.  If you are feeling the baby move.  If you have had   any abnormal symptoms, such as leaking fluid, bleeding, severe headaches, or abdominal cramping.  If you are using any tobacco products, including cigarettes, chewing tobacco, and electronic cigarettes.  If you have any questions.  Other tests or screenings that may be performed during your third trimester include:  Blood tests that check for low iron levels (anemia).  Fetal testing to check the health, activity level, and growth of the fetus. Testing is done if you have certain medical conditions or if there are problems during the  pregnancy.  Nonstress test (NST). This test checks the health of your baby to make sure there are no signs of problems, such as the baby not getting enough oxygen. During this test, a belt is placed around your belly. The baby is made to move, and its heart rate is monitored during movement.  What is false labor? False labor is a condition in which you feel small, irregular tightenings of the muscles in the womb (contractions) that usually go away with rest, changing position, or drinking water. These are called Braxton Hicks contractions. Contractions may last for hours, days, or even weeks before true labor sets in. If contractions come at regular intervals, become more frequent, increase in intensity, or become painful, you should see your health care provider. What are the signs of labor?  Abdominal cramps.  Regular contractions that start at 10 minutes apart and become stronger and more frequent with time.  Contractions that start on the top of the uterus and spread down to the lower abdomen and back.  Increased pelvic pressure and dull back pain.  A watery or bloody mucus discharge that comes from the vagina.  Leaking of amniotic fluid. This is also known as your "water breaking." It could be a slow trickle or a gush. Let your health care provider know if it has a color or strange odor. If you have any of these signs, call your health care provider right away, even if it is before your due date. Follow these instructions at home: Medicines  Follow your health care provider's instructions regarding medicine use. Specific medicines may be either safe or unsafe to take during pregnancy.  Take a prenatal vitamin that contains at least 600 micrograms (mcg) of folic acid.  If you develop constipation, try taking a stool softener if your health care provider approves. Eating and drinking  Eat a balanced diet that includes fresh fruits and vegetables, whole grains, good sources of protein  such as meat, eggs, or tofu, and low-fat dairy. Your health care provider will help you determine the amount of weight gain that is right for you.  Avoid raw meat and uncooked cheese. These carry germs that can cause birth defects in the baby.  If you have low calcium intake from food, talk to your health care provider about whether you should take a daily calcium supplement.  Eat four or five small meals rather than three large meals a day.  Limit foods that are high in fat and processed sugars, such as fried and sweet foods.  To prevent constipation: ? Drink enough fluid to keep your urine clear or pale yellow. ? Eat foods that are high in fiber, such as fresh fruits and vegetables, whole grains, and beans. Activity  Exercise only as directed by your health care provider. Most women can continue their usual exercise routine during pregnancy. Try to exercise for 30 minutes at least 5 days a week. Stop exercising if you experience uterine contractions.  Avoid heavy   lifting.  Do not exercise in extreme heat or humidity, or at high altitudes.  Wear low-heel, comfortable shoes.  Practice good posture.  You may continue to have sex unless your health care provider tells you otherwise. Relieving pain and discomfort  Take frequent breaks and rest with your legs elevated if you have leg cramps or low back pain.  Take warm sitz baths to soothe any pain or discomfort caused by hemorrhoids. Use hemorrhoid cream if your health care provider approves.  Wear a good support bra to prevent discomfort from breast tenderness.  If you develop varicose veins: ? Wear support pantyhose or compression stockings as told by your healthcare provider. ? Elevate your feet for 15 minutes, 3-4 times a day. Prenatal care  Write down your questions. Take them to your prenatal visits.  Keep all your prenatal visits as told by your health care provider. This is important. Safety  Wear your seat belt at  all times when driving.  Make a list of emergency phone numbers, including numbers for family, friends, the hospital, and police and fire departments. General instructions  Avoid cat litter boxes and soil used by cats. These carry germs that can cause birth defects in the baby. If you have a cat, ask someone to clean the litter box for you.  Do not travel far distances unless it is absolutely necessary and only with the approval of your health care provider.  Do not use hot tubs, steam rooms, or saunas.  Do not drink alcohol.  Do not use any products that contain nicotine or tobacco, such as cigarettes and e-cigarettes. If you need help quitting, ask your health care provider.  Do not use any medicinal herbs or unprescribed drugs. These chemicals affect the formation and growth of the baby.  Do not douche or use tampons or scented sanitary pads.  Do not cross your legs for long periods of time.  To prepare for the arrival of your baby: ? Take prenatal classes to understand, practice, and ask questions about labor and delivery. ? Make a trial run to the hospital. ? Visit the hospital and tour the maternity area. ? Arrange for maternity or paternity leave through employers. ? Arrange for family and friends to take care of pets while you are in the hospital. ? Purchase a rear-facing car seat and make sure you know how to install it in your car. ? Pack your hospital bag. ? Prepare the baby's nursery. Make sure to remove all pillows and stuffed animals from the baby's crib to prevent suffocation.  Visit your dentist if you have not gone during your pregnancy. Use a soft toothbrush to brush your teeth and be gentle when you floss. Contact a health care provider if:  You are unsure if you are in labor or if your water has broken.  You become dizzy.  You have mild pelvic cramps, pelvic pressure, or nagging pain in your abdominal area.  You have lower back pain.  You have persistent  nausea, vomiting, or diarrhea.  You have an unusual or bad smelling vaginal discharge.  You have pain when you urinate. Get help right away if:  Your water breaks before 37 weeks.  You have regular contractions less than 5 minutes apart before 37 weeks.  You have a fever.  You are leaking fluid from your vagina.  You have spotting or bleeding from your vagina.  You have severe abdominal pain or cramping.  You have rapid weight loss or weight gain.    You have shortness of breath with chest pain.  You notice sudden or extreme swelling of your face, hands, ankles, feet, or legs.  Your baby makes fewer than 10 movements in 2 hours.  You have severe headaches that do not go away when you take medicine.  You have vision changes. Summary  The third trimester is from week 28 through week 40, months 7 through 9. The third trimester is a time when the unborn baby (fetus) is growing rapidly.  During the third trimester, your discomfort may increase as you and your baby continue to gain weight. You may have abdominal, leg, and back pain, sleeping problems, and an increased need to urinate.  During the third trimester your breasts will keep growing and they will continue to become tender. A yellow fluid (colostrum) may leak from your breasts. This is the first milk you are producing for your baby.  False labor is a condition in which you feel small, irregular tightenings of the muscles in the womb (contractions) that eventually go away. These are called Braxton Hicks contractions. Contractions may last for hours, days, or even weeks before true labor sets in.  Signs of labor can include: abdominal cramps; regular contractions that start at 10 minutes apart and become stronger and more frequent with time; watery or bloody mucus discharge that comes from the vagina; increased pelvic pressure and dull back pain; and leaking of amniotic fluid. This information is not intended to replace advice  given to you by your health care provider. Make sure you discuss any questions you have with your health care provider. Document Released: 08/20/2001 Document Revised: 02/01/2016 Document Reviewed: 10/27/2012 Elsevier Interactive Patient Education  2017 [employer] Gestational Diabetes Mellitus, Diagnosis Gestational diabetes (gestational diabetes mellitus) is a temporary form of diabetes that some women develop during pregnancy. It usually occurs around weeks 24-28 of pregnancy and goes away after delivery. Hormonal changes during pregnancy can interfere with insulin production and function, which may result in one or both of these problems:  The pancreas does not make enough of a hormone called insulin.  Cells in the body do not respond properly to insulin that the body makes (insulin resistance).  Normally, insulin allows sugars (glucose) to enter cells in the body. The cells use glucose for energy. Insulin resistance or lack of insulin causes excess glucose to build up in the blood instead of going into cells. As a result, high blood glucose (hyperglycemia) develops. What are the risks? If gestational diabetes is treated, it is unlikely to cause problems. If it is not controlled with treatment, it may cause problems during labor and delivery, and some of those problems can be harmful to the unborn baby (fetus) and the mother. Uncontrolled gestational diabetes may also cause the newborn baby to have breathing problems and low blood glucose. Women who get gestational diabetes are more likely to develop it if they get pregnant again, and they are more likely to develop type 2 diabetes in the future. What increases the risk? This condition may be more likely to develop in pregnant women who:  Are older than age 25 during pregnancy.  Have a family history of diabetes.  Are overweight.  Had gestational diabetes in the past.  Have polycystic ovarian syndrome (PCOS).  Are pregnant with  twins or multiples.  Are of American-Indian, African-American, Hispanic/Latino, or Asian/Pacific Islander descent.  What are the signs or symptoms? Most women do not notice symptoms of gestational diabetes because the symptoms are similar to   normal symptoms of pregnancy. Symptoms of gestational diabetes may include:  Increased thirst (polydipsia).  Increased hunger(polyphagia).  Increased urination (polyuria).  How is this diagnosed?  This condition may be diagnosed based on your blood glucose level, which may be checked with one or more of the following blood tests:  A fasting blood glucose (FBG) test. You will not be allowed to eat (you will fast) for at least 8 hours before a blood sample is taken.  A random blood glucose test. This checks your blood glucose at any time of day regardless of when you ate.  An oral glucose tolerance test (OGTT). This is usually done during weeks 24-28 of pregnancy. ? For this test, you will have an FBG test done. Then, you will drink a beverage that contains glucose. Your blood glucose will be tested again 1 hour after drinking the glucose beverage (1-hour OGTT). ? If the 1-hour OGTT result is at or above 140 mg/dL (7.8 mmol/L), you will repeat the OGTT. This time, your blood glucose will be tested 3 hours after drinking the glucose beverage (3-hour OGTT).  If you have risk factors, you may be screened for undiagnosed type 2 diabetes at your first health care visit during your pregnancy (prenatal visit). How is this treated?  Your treatment may be managed by a specialist called an endocrinologist. This condition is treated by following instructions from your health care provider about:  Eating a healthier diet and getting more physical activity. These changes are the most important ways to manage gestational diabetes.  Checking your blood glucose. Do this as often as told.  Taking diabetes medicines or insulin every day. These will only be  prescribed if they are needed. ? If you use insulin, you may need to adjust your dosage based on how physically active you are and what foods you eat. Your health care provider will tell you how to do this.  Your health care provider will set treatment goals for you based on the stage of your pregnancy and any other medical conditions you have. Generally, the goal of treatment is to maintain the following blood glucose levels during pregnancy:  Fasting: at or below 95 mg/dL (5.3 mmol/L).  After meals (postprandial): ? One hour after a meal: at or below 140 mg/dL (7.8 mmol/L). ? Two hours after a meal: at or below 120 mg/dL (6.7 mmol/L).  A1c (hemoglobin A1c) level: 6-6.5%.  Follow these instructions at home:  Take over-the-counter and prescription medicines only as told by your health care provider.  Manage your weight gain during pregnancy. The amount of weight that you are expected to gain depends on your pre-pregnancy BMI (body mass index).  Keep all follow-up visits as told by your health care provider. This is important. Consider asking your health care provider these questions:   Do I need to meet with a diabetes educator?  Where can I find a support group for people with diabetes?  What equipment will I need to manage my diabetes at home?  What diabetes medicines do I need, and when should I take them?  How often do I need to check my blood glucose?  What number can I call if I have questions?  When is my next appointment? Where to find more information:  For more information about diabetes, visit: ? American Diabetes Association (ADA): www.diabetes.org ? American Association of Diabetes Educators (AADE): www.diabeteseducator.org/patient-resources Contact a health care provider if:  Your blood glucose level is at or above 240 mg/dL (13.3   mmol/L).  Your blood glucose level is at or above 200 mg/dL (11.1 mmol/L) and you have ketones in your urine.  You have been  sick or have had a fever for 2 days or more and you are not getting better.  You have any of the following problems for more than 6 hours: ? You cannot eat or drink. ? You have nausea and vomiting. ? You have diarrhea. Get help right away if:  Your blood glucose is below 54 mg/dL (3 mmol/L).  You become confused or you have trouble thinking clearly.  You have difficulty breathing.  You have moderate or large ketone levels in your urine.  Your baby is moving around less than usual.  You develop unusual discharge or bleeding from your vagina.  You start having contractions early (prematurely). Contractions may feel like a tightening in your lower abdomen. This information is not intended to replace advice given to you by your health care provider. Make sure you discuss any questions you have with your health care provider. Document Released: 12/02/2000 Document Revised: 02/01/2016 Document Reviewed: 09/29/2015 Elsevier Interactive Patient Education  2017 [employer]  

## 2016-11-12 NOTE — Progress Notes (Signed)
Pt states that she has had some low fasting glucose, around/below 70, since starting on Glyburide.  Pt made aware provider can discuss with her today.

## 2016-11-12 NOTE — Progress Notes (Signed)
Subjective:  Lorraine Nguyen is a 31 y.o. G3P2002 at 5744w0d being seen today for ongoing prenatal care.  She is currently monitored for the following issues for this high-risk pregnancy and has Supervision of high risk pregnancy, antepartum; Group B streptococcal bacteriuria; Elevated AFP; and Gestational diabetes on her problem list.  Patient reports no complaints.  Contractions: Not present. Vag. Bleeding: None.  Movement: Present. Denies leaking of fluid.   The following portions of the patient's history were reviewed and updated as appropriate: allergies, current medications, past family history, past medical history, past social history, past surgical history and problem list. Problem list updated.  Objective:   Vitals:   11/12/16 0916  BP: 126/78  Pulse: (!) 108  Weight: 249 lb (112.9 kg)    Fetal Status:     Movement: Present     General:  Alert, oriented and cooperative. Patient is in no acute distress.  Skin: Skin is warm and dry. No rash noted.   Cardiovascular: Normal heart rate noted  Respiratory: Normal respiratory effort, no problems with respiration noted  Abdomen: Soft, gravid, appropriate for gestational age. Pain/Pressure: Present     Pelvic:  Cervical exam deferred        Extremities: Normal range of motion.     Mental Status: Normal mood and affect. Normal behavior. Normal judgment and thought content.   Urinalysis:      Assessment and Plan:  Pregnancy: G3P2002 at 6744w0d  1. Elevated AFP Normal U/S and follow up U/S  2. Group B streptococcal bacteriuria Treat while in labor  3. Supervision of high risk pregnancy, antepartum   4. Gestational diabetes mellitus (GDM) in third trimester controlled on oral hypoglycemic drug Forgot to bring BS readings Reports Fasting 70-80's, tolerating 2 PP mostly goal range, abnormal related to portion/diet choice Diet reviewed Will continue with Glyburide at present dosage U/S for F/U growth end of month Continue  with twice weekly testing   Preterm labor symptoms and general obstetric precautions including but not limited to vaginal bleeding, contractions, leaking of fluid and fetal movement were reviewed in detail with the patient. Please refer to After Visit Summary for other counseling recommendations.  Return in about 1 week (around 11/19/2016) for OB visit.   Hermina StaggersMichael L Ervin, MD

## 2016-11-15 ENCOUNTER — Ambulatory Visit (INDEPENDENT_AMBULATORY_CARE_PROVIDER_SITE_OTHER): Payer: Medicaid Other | Admitting: Certified Nurse Midwife

## 2016-11-15 DIAGNOSIS — O24415 Gestational diabetes mellitus in pregnancy, controlled by oral hypoglycemic drugs: Secondary | ICD-10-CM

## 2016-11-15 NOTE — Progress Notes (Signed)
Patient reports she is doing better with her contractions since she started on medication for GDM. BP 129/78 P 97 WT 249.8 lb Reactive strip per Rachelle review. Patient has follow up appointment next week.

## 2016-11-15 NOTE — Progress Notes (Signed)
S: no complaints O:NST: + accels, no decels, moderate variability, Cat. 1 tracing. FHR between 130-145 with accels to 160. No contractions on toco.  A: reactive NST.  P: continue monitoring for GDM twice weekly.

## 2016-11-19 ENCOUNTER — Ambulatory Visit (INDEPENDENT_AMBULATORY_CARE_PROVIDER_SITE_OTHER): Payer: Medicaid Other | Admitting: Obstetrics and Gynecology

## 2016-11-19 VITALS — BP 130/78 | HR 95 | Wt 249.3 lb

## 2016-11-19 DIAGNOSIS — O24415 Gestational diabetes mellitus in pregnancy, controlled by oral hypoglycemic drugs: Secondary | ICD-10-CM

## 2016-11-19 DIAGNOSIS — Z3689 Encounter for other specified antenatal screening: Secondary | ICD-10-CM

## 2016-11-19 DIAGNOSIS — O0990 Supervision of high risk pregnancy, unspecified, unspecified trimester: Secondary | ICD-10-CM

## 2016-11-19 DIAGNOSIS — R8271 Bacteriuria: Secondary | ICD-10-CM

## 2016-11-19 NOTE — Progress Notes (Signed)
   PRENATAL VISIT NOTE  Subjective:  Lorraine Nguyen is a 31 y.o. G3P2002 at 1769w0d being seen today for ongoing prenatal care.  She is currently monitored for the following issues for this high-risk pregnancy and has Supervision of high risk pregnancy, antepartum; Group B streptococcal bacteriuria; Elevated AFP; and Gestational diabetes on her problem list.  Patient reports no complaints.  Contractions: Not present. Vag. Bleeding: None.  Movement: Present. Denies leaking of fluid.   The following portions of the patient's history were reviewed and updated as appropriate: allergies, current medications, past family history, past medical history, past social history, past surgical history and problem list. Problem list updated.  Objective:   Vitals:   11/19/16 1425  BP: 130/78  Pulse: 95  Weight: 249 lb 4.8 oz (113.1 kg)    Fetal Status: Fetal Heart Rate (bpm): NST Fundal Height: 36 cm Movement: Present     General:  Alert, oriented and cooperative. Patient is in no acute distress.  Skin: Skin is warm and dry. No rash noted.   Cardiovascular: Normal heart rate noted  Respiratory: Normal respiratory effort, no problems with respiration noted  Abdomen: Soft, gravid, appropriate for gestational age. Pain/Pressure: Absent     Pelvic:  Cervical exam deferred        Extremities: Normal range of motion.     Mental Status: Normal mood and affect. Normal behavior. Normal judgment and thought content.   Assessment and Plan:  Pregnancy: G3P2002 at 2469w0d  1. Gestational diabetes mellitus (GDM) in third trimester controlled on oral hypoglycemic drug - Patient did not bring log book but reports fasting as high as 84 with values mainly in the 70's. She reports highest pp of 118 with most values being in the 110's - Reminded patient to bring [facility] log book or meter for review at her next visit Continue current glyburide dosing - Normal growth on 3/2. Follow up growth in April - NST reviewed and  reactive with baseline 150, mod variability, + accels no decels - Pt informed that the ultrasound is considered a limited OB ultrasound and is not intended to be a complete ultrasound exam.  Patient also informed that the ultrasound is not being completed with the intent of assessing for fetal or placental anomalies or any pelvic abnormalities.  Explained that the purpose of today's ultrasound is to assess for  AFI.  Patient acknowledges the purpose of the exam and the limitations of the study.  AFI noted to be 8.3  2. Supervision of high risk pregnancy, antepartum Patient is doing well without complaints  3. Group B streptococcal bacteriuria Will provide prophylaxis in labor  Preterm labor symptoms and general obstetric precautions including but not limited to vaginal bleeding, contractions, leaking of fluid and fetal movement were reviewed in detail with the patient. Please refer to After Visit Summary for other counseling recommendations.  Return in about 1 week (around 11/26/2016) for ROB, NST.   Catalina AntiguaPeggy Gabriela Giannelli, MD

## 2016-11-22 ENCOUNTER — Ambulatory Visit (INDEPENDENT_AMBULATORY_CARE_PROVIDER_SITE_OTHER): Payer: Medicaid Other | Admitting: Certified Nurse Midwife

## 2016-11-22 DIAGNOSIS — O24415 Gestational diabetes mellitus in pregnancy, controlled by oral hypoglycemic drugs: Secondary | ICD-10-CM | POA: Diagnosis not present

## 2016-11-22 DIAGNOSIS — O0990 Supervision of high risk pregnancy, unspecified, unspecified trimester: Secondary | ICD-10-CM

## 2016-11-22 NOTE — Progress Notes (Signed)
O:NST: + accels, no decels, moderate variability, Cat. 1 tracing. No contractions on toco.  A: reactive nst.  GDM P: continue scheduled monitoring for GDM

## 2016-11-22 NOTE — Progress Notes (Signed)
Patient is here for NST. Placed on machine

## 2016-11-26 ENCOUNTER — Encounter: Payer: Medicaid Other | Admitting: Obstetrics & Gynecology

## 2016-11-29 ENCOUNTER — Ambulatory Visit (INDEPENDENT_AMBULATORY_CARE_PROVIDER_SITE_OTHER): Payer: Medicaid Other | Admitting: Certified Nurse Midwife

## 2016-11-29 ENCOUNTER — Ambulatory Visit (HOSPITAL_COMMUNITY)
Admission: RE | Admit: 2016-11-29 | Discharge: 2016-11-29 | Disposition: A | Discharge status: Home / Self Care | Payer: Medicaid Other | Source: Ambulatory Visit | Attending: Certified Nurse Midwife | Admitting: Certified Nurse Midwife

## 2016-11-29 ENCOUNTER — Other Ambulatory Visit (HOSPITAL_COMMUNITY): Payer: Self-pay | Admitting: Obstetrics and Gynecology

## 2016-11-29 ENCOUNTER — Encounter (HOSPITAL_COMMUNITY): Payer: Self-pay

## 2016-11-29 DIAGNOSIS — O24415 Gestational diabetes mellitus in pregnancy, controlled by oral hypoglycemic drugs: Secondary | ICD-10-CM

## 2016-11-29 DIAGNOSIS — Z3A36 36 weeks gestation of pregnancy: Secondary | ICD-10-CM

## 2016-11-29 DIAGNOSIS — O0990 Supervision of high risk pregnancy, unspecified, unspecified trimester: Secondary | ICD-10-CM

## 2016-11-29 NOTE — Progress Notes (Signed)
S; no complaints  O: NST: + accels, no decels, moderate variability, Cat. 1 tracing. No contractions on toco.   A: reactive NST GDM  P: continue antenatal testing, US BPP with AFI scheduled.  Plan for delivery around 39 weeks.

## 2016-11-29 NOTE — Progress Notes (Signed)
Patient presents for NST. Placed on machine.

## 2016-12-03 ENCOUNTER — Encounter: Payer: Self-pay | Admitting: Obstetrics & Gynecology

## 2016-12-03 ENCOUNTER — Ambulatory Visit (INDEPENDENT_AMBULATORY_CARE_PROVIDER_SITE_OTHER): Payer: Medicaid Other | Admitting: Obstetrics & Gynecology

## 2016-12-03 VITALS — BP 120/81 | HR 94 | Wt 248.0 lb

## 2016-12-03 DIAGNOSIS — O0990 Supervision of high risk pregnancy, unspecified, unspecified trimester: Secondary | ICD-10-CM

## 2016-12-03 DIAGNOSIS — O24415 Gestational diabetes mellitus in pregnancy, controlled by oral hypoglycemic drugs: Secondary | ICD-10-CM

## 2016-12-03 NOTE — Progress Notes (Signed)
Growth US reviewed. NST reactive today   PRENATAL VISIT NOTE  Subjective:  Lorraine Nguyen is a 31 y.o. G3P2002 at 6075w0d being seen today for ongoing prenatal care.  She is currently monitored for the following issues for this high-risk pregnancy and has Supervision of high risk pregnancy, antepartum; Group B streptococcal bacteriuria; Elevated AFP; and Gestational diabetes on her problem list.  Patient reports no complaints.  Contractions: Not present. Vag. Bleeding: None.  Movement: Present. Denies leaking of fluid.   The following portions of the patient's history were reviewed and updated as appropriate: allergies, current medications, past family history, past medical history, past social history, past surgical history and problem list. Problem list updated.  Objective:   Vitals:   12/03/16 1014  BP: 120/81  Pulse: 94  Weight: 248 lb (112.5 kg)    Fetal Status: Fetal Heart Rate (bpm): NST   Movement: Present     General:  Alert, oriented and cooperative. Patient is in no acute distress.  Skin: Skin is warm and dry. No rash noted.   Cardiovascular: Normal heart rate noted  Respiratory: Normal respiratory effort, no problems with respiration noted  Abdomen: Soft, gravid, appropriate for gestational age. Pain/Pressure: Absent     Pelvic:  Cervical exam deferred        Extremities: Normal range of motion.     Mental Status: Normal mood and affect. Normal behavior. Normal judgment and thought content.   Assessment and Plan:  Pregnancy: G3P2002 at 6075w0d  1. Supervision of high risk pregnancy, antepartum NST and growth reassuring  2. Gestational diabetes mellitus (GDM) in third trimester controlled on oral hypoglycemic drug States good control will continue glyburide  Term labor symptoms and general obstetric precautions including but not limited to vaginal bleeding, contractions, leaking of fluid and fetal movement were reviewed in detail with the patient. Please refer to  After Visit Summary for other counseling recommendations.  Return in about 1 week (around 12/10/2016) for NST. IOL 39 weeks  Adam PhenixJames G Doaa Kendzierski, MD

## 2016-12-06 ENCOUNTER — Ambulatory Visit (HOSPITAL_COMMUNITY)
Admission: RE | Admit: 2016-12-06 | Discharge: 2016-12-06 | Disposition: A | Discharge status: Home / Self Care | Payer: Medicaid Other | Source: Ambulatory Visit | Attending: Certified Nurse Midwife | Admitting: Certified Nurse Midwife

## 2016-12-06 DIAGNOSIS — O0990 Supervision of high risk pregnancy, unspecified, unspecified trimester: Secondary | ICD-10-CM

## 2016-12-06 DIAGNOSIS — O0993 Supervision of high risk pregnancy, unspecified, third trimester: Secondary | ICD-10-CM | POA: Insufficient documentation

## 2016-12-06 DIAGNOSIS — O24415 Gestational diabetes mellitus in pregnancy, controlled by oral hypoglycemic drugs: Secondary | ICD-10-CM | POA: Insufficient documentation

## 2016-12-06 DIAGNOSIS — Z3A37 37 weeks gestation of pregnancy: Secondary | ICD-10-CM | POA: Insufficient documentation

## 2016-12-10 ENCOUNTER — Encounter (HOSPITAL_COMMUNITY): Payer: Self-pay | Admitting: *Deleted

## 2016-12-10 ENCOUNTER — Telehealth (HOSPITAL_COMMUNITY): Payer: Self-pay | Admitting: *Deleted

## 2016-12-10 NOTE — Telephone Encounter (Signed)
Preadmission screen  

## 2016-12-11 ENCOUNTER — Encounter (HOSPITAL_COMMUNITY): Payer: Self-pay | Admitting: *Deleted

## 2016-12-11 ENCOUNTER — Encounter: Payer: Medicaid Other | Admitting: Obstetrics & Gynecology

## 2016-12-11 ENCOUNTER — Inpatient Hospital Stay (HOSPITAL_COMMUNITY): Payer: Medicaid Other | Admitting: Anesthesiology

## 2016-12-11 ENCOUNTER — Inpatient Hospital Stay (HOSPITAL_COMMUNITY)
Admission: AD | Admit: 2016-12-11 | Discharge: 2016-12-13 | DRG: 775 | Disposition: A | Discharge status: Home / Self Care | Payer: Medicaid Other | Source: Ambulatory Visit | Attending: Obstetrics and Gynecology | Admitting: Obstetrics and Gynecology

## 2016-12-11 DIAGNOSIS — O4292 Full-term premature rupture of membranes, unspecified as to length of time between rupture and onset of labor: Secondary | ICD-10-CM | POA: Diagnosis present

## 2016-12-11 DIAGNOSIS — O0990 Supervision of high risk pregnancy, unspecified, unspecified trimester: Secondary | ICD-10-CM

## 2016-12-11 DIAGNOSIS — Z8249 Family history of ischemic heart disease and other diseases of the circulatory system: Secondary | ICD-10-CM

## 2016-12-11 DIAGNOSIS — O24429 Gestational diabetes mellitus in childbirth, unspecified control: Secondary | ICD-10-CM

## 2016-12-11 DIAGNOSIS — O24425 Gestational diabetes mellitus in childbirth, controlled by oral hypoglycemic drugs: Secondary | ICD-10-CM | POA: Diagnosis present

## 2016-12-11 DIAGNOSIS — Z3A38 38 weeks gestation of pregnancy: Secondary | ICD-10-CM

## 2016-12-11 DIAGNOSIS — O99334 Smoking (tobacco) complicating childbirth: Secondary | ICD-10-CM | POA: Diagnosis present

## 2016-12-11 DIAGNOSIS — O99824 Streptococcus B carrier state complicating childbirth: Secondary | ICD-10-CM | POA: Diagnosis present

## 2016-12-11 DIAGNOSIS — Z833 Family history of diabetes mellitus: Secondary | ICD-10-CM | POA: Diagnosis not present

## 2016-12-11 DIAGNOSIS — F17210 Nicotine dependence, cigarettes, uncomplicated: Secondary | ICD-10-CM | POA: Diagnosis present

## 2016-12-11 LAB — TYPE AND SCREEN
ABO/RH(D): A POS
Antibody Screen: NEGATIVE

## 2016-12-11 LAB — GLUCOSE, CAPILLARY
Glucose-Capillary: 89 mg/dL (ref 65–99)
Glucose-Capillary: 98 mg/dL (ref 65–99)

## 2016-12-11 LAB — CBC
HCT: 38.3 % (ref 36.0–46.0)
HEMOGLOBIN: 12.5 g/dL (ref 12.0–15.0)
MCH: 27.5 pg (ref 26.0–34.0)
MCHC: 32.6 g/dL (ref 30.0–36.0)
MCV: 84.4 fL (ref 78.0–100.0)
PLATELETS: 238 10*3/uL (ref 150–400)
RBC: 4.54 MIL/uL (ref 3.87–5.11)
RDW: 14.6 % (ref 11.5–15.5)
WBC: 13.3 10*3/uL — ABNORMAL HIGH (ref 4.0–10.5)

## 2016-12-11 MED ORDER — WITCH HAZEL-GLYCERIN EX PADS: 1.0000 "application " | MEDICATED_PAD | CUTANEOUS | Status: DC | PRN

## 2016-12-11 MED ORDER — LACTATED RINGERS IV SOLN
INTRAVENOUS | Status: DC
  Administered 2016-12-11: 08:00:00 via INTRAVENOUS

## 2016-12-11 MED ORDER — OXYTOCIN BOLUS FROM INFUSION
500.0000 mL | Freq: Once | INTRAVENOUS | Status: AC
  Administered 2016-12-11: 500 mL via INTRAVENOUS

## 2016-12-11 MED ORDER — SOD CITRATE-CITRIC ACID 500-334 MG/5ML PO SOLN: 30.0000 mL | ORAL | Status: DC | PRN

## 2016-12-11 MED ORDER — PHENYLEPHRINE 40 MCG/ML (10ML) SYRINGE FOR IV PUSH (FOR BLOOD PRESSURE SUPPORT)
80.0000 ug | PREFILLED_SYRINGE | INTRAVENOUS | Status: DC | PRN
  Filled 2016-12-11: qty 5

## 2016-12-11 MED ORDER — DIPHENHYDRAMINE HCL 25 MG PO CAPS: 25.0000 mg | ORAL_CAPSULE | Freq: Four times a day (QID) | ORAL | Status: DC | PRN

## 2016-12-11 MED ORDER — LACTATED RINGERS IV SOLN: 500.0000 mL | INTRAVENOUS | Status: DC | PRN

## 2016-12-11 MED ORDER — EPHEDRINE 5 MG/ML INJ
10.0000 mg | INTRAVENOUS | Status: DC | PRN
  Filled 2016-12-11: qty 2

## 2016-12-11 MED ORDER — ONDANSETRON HCL 4 MG PO TABS: 4.0000 mg | ORAL_TABLET | ORAL | Status: DC | PRN

## 2016-12-11 MED ORDER — PHENYLEPHRINE 40 MCG/ML (10ML) SYRINGE FOR IV PUSH (FOR BLOOD PRESSURE SUPPORT)
80.0000 ug | PREFILLED_SYRINGE | INTRAVENOUS | Status: DC | PRN
  Filled 2016-12-11: qty 5
  Filled 2016-12-11: qty 10

## 2016-12-11 MED ORDER — LACTATED RINGERS IV SOLN
500.0000 mL | Freq: Once | INTRAVENOUS | Status: AC
  Administered 2016-12-11: 500 mL via INTRAVENOUS

## 2016-12-11 MED ORDER — ACETAMINOPHEN 325 MG PO TABS: 650.0000 mg | ORAL_TABLET | ORAL | Status: DC | PRN

## 2016-12-11 MED ORDER — LIDOCAINE HCL (PF) 1 % IJ SOLN
30.0000 mL | INTRAMUSCULAR | Status: DC | PRN
  Filled 2016-12-11: qty 30

## 2016-12-11 MED ORDER — FENTANYL 2.5 MCG/ML BUPIVACAINE 1/10 % EPIDURAL INFUSION (WH - ANES)
14.0000 mL/h | INTRAMUSCULAR | Status: DC | PRN
  Filled 2016-12-11: qty 100

## 2016-12-11 MED ORDER — SIMETHICONE 80 MG PO CHEW: 80.0000 mg | CHEWABLE_TABLET | ORAL | Status: DC | PRN

## 2016-12-11 MED ORDER — ONDANSETRON HCL 4 MG/2ML IJ SOLN: 4.0000 mg | Freq: Four times a day (QID) | INTRAMUSCULAR | Status: DC | PRN

## 2016-12-11 MED ORDER — BENZOCAINE-MENTHOL 20-0.5 % EX AERO
1.0000 "application " | INHALATION_SPRAY | CUTANEOUS | Status: DC | PRN
  Administered 2016-12-11: 1 via TOPICAL
  Filled 2016-12-11: qty 56

## 2016-12-11 MED ORDER — PENICILLIN G POT IN DEXTROSE 60000 UNIT/ML IV SOLN
3.0000 10*6.[IU] | INTRAVENOUS | Status: DC
  Filled 2016-12-11: qty 50

## 2016-12-11 MED ORDER — IBUPROFEN 600 MG PO TABS
600.0000 mg | ORAL_TABLET | Freq: Four times a day (QID) | ORAL | Status: DC
  Administered 2016-12-11 – 2016-12-13 (×7): 600 mg via ORAL
  Filled 2016-12-11 (×9): qty 1

## 2016-12-11 MED ORDER — DIPHENHYDRAMINE HCL 50 MG/ML IJ SOLN: 12.5000 mg | INTRAMUSCULAR | Status: DC | PRN

## 2016-12-11 MED ORDER — ONDANSETRON HCL 4 MG/2ML IJ SOLN: 4.0000 mg | INTRAMUSCULAR | Status: DC | PRN

## 2016-12-11 MED ORDER — FLEET ENEMA 7-19 GM/118ML RE ENEM: 1.0000 | ENEMA | RECTAL | Status: DC | PRN

## 2016-12-11 MED ORDER — ZOLPIDEM TARTRATE 5 MG PO TABS: 5.0000 mg | ORAL_TABLET | Freq: Every evening | ORAL | Status: DC | PRN

## 2016-12-11 MED ORDER — FENTANYL CITRATE (PF) 100 MCG/2ML IJ SOLN
50.0000 ug | INTRAMUSCULAR | Status: DC | PRN
  Administered 2016-12-11: 100 ug via INTRAVENOUS
  Filled 2016-12-11: qty 2

## 2016-12-11 MED ORDER — OXYCODONE-ACETAMINOPHEN 5-325 MG PO TABS: 2.0000 | ORAL_TABLET | ORAL | Status: DC | PRN

## 2016-12-11 MED ORDER — ACETAMINOPHEN 325 MG PO TABS
650.0000 mg | ORAL_TABLET | ORAL | Status: DC | PRN
  Administered 2016-12-11 – 2016-12-12 (×4): 650 mg via ORAL
  Filled 2016-12-11 (×4): qty 2

## 2016-12-11 MED ORDER — COMPLETENATE 29-1 MG PO CHEW
1.0000 | CHEWABLE_TABLET | Freq: Every day | ORAL | Status: DC
  Administered 2016-12-12: 1 via ORAL
  Filled 2016-12-11 (×3): qty 1

## 2016-12-11 MED ORDER — PRENATAL MULTIVITAMIN CH
1.0000 | ORAL_TABLET | Freq: Every day | ORAL | Status: DC
  Filled 2016-12-11 (×3): qty 1

## 2016-12-11 MED ORDER — PENICILLIN G POTASSIUM 5000000 UNITS IJ SOLR
5.0000 10*6.[IU] | Freq: Once | INTRAVENOUS | Status: AC
  Administered 2016-12-11: 5 10*6.[IU] via INTRAVENOUS
  Filled 2016-12-11: qty 5

## 2016-12-11 MED ORDER — OXYCODONE HCL 5 MG PO TABS
5.0000 mg | ORAL_TABLET | ORAL | Status: DC | PRN
  Administered 2016-12-11 – 2016-12-13 (×6): 5 mg via ORAL
  Filled 2016-12-11 (×7): qty 1

## 2016-12-11 MED ORDER — COCONUT OIL OIL: 1.0000 "application " | TOPICAL_OIL | Status: DC | PRN

## 2016-12-11 MED ORDER — TETANUS-DIPHTH-ACELL PERTUSSIS 5-2.5-18.5 LF-MCG/0.5 IM SUSP: 0.5000 mL | Freq: Once | INTRAMUSCULAR | Status: DC

## 2016-12-11 MED ORDER — OXYCODONE-ACETAMINOPHEN 5-325 MG PO TABS: 1.0000 | ORAL_TABLET | ORAL | Status: DC | PRN

## 2016-12-11 MED ORDER — SENNOSIDES-DOCUSATE SODIUM 8.6-50 MG PO TABS
2.0000 | ORAL_TABLET | ORAL | Status: DC
  Administered 2016-12-11: 2 via ORAL
  Filled 2016-12-11 (×2): qty 2

## 2016-12-11 MED ORDER — DIBUCAINE 1 % RE OINT: 1.0000 "application " | TOPICAL_OINTMENT | RECTAL | Status: DC | PRN

## 2016-12-11 MED ORDER — OXYTOCIN 40 UNITS IN LACTATED RINGERS INFUSION - SIMPLE MED
2.5000 [IU]/h | INTRAVENOUS | Status: DC
  Filled 2016-12-11: qty 1000

## 2016-12-11 NOTE — Progress Notes (Signed)
Patient insisted that I take IV out. She said it hurt. I told her we would like to keep it in. Risk factors told in case we would have to restick her for another IV she understood.

## 2016-12-11 NOTE — Anesthesia Postprocedure Evaluation (Signed)
Anesthesia Post Note  Patient: Lorraine Nguyen  Procedure(s) Performed: * No procedures listed *  Patient location during evaluation: L&D Anesthetic complications: no Comments: Pt refused epidural        Last Vitals:  Vitals:   12/11/16 1046 12/11/16 1100  BP: 137/78 140/76  Pulse: 86 83  Resp:    Temp:  37 C    Last Pain:  Vitals:   12/11/16 1100  TempSrc: Oral  PainSc:    Pain Goal: Patients Stated Pain Goal: 4 (12/11/16 0904)               Killian Ress JR,JOHN Susann Givens

## 2016-12-11 NOTE — Anesthesia Preprocedure Evaluation (Signed)
Anesthesia Evaluation  Patient identified by MRN, date of birth, ID band Patient awake    Reviewed: Allergy & Precautions, H&P , NPO status , Patient's Chart, lab work & pertinent test results  Airway Mallampati: I  TM Distance: >3 FB Neck ROM: full    Dental no notable dental hx.    Pulmonary Current Smoker,    Pulmonary exam normal        Cardiovascular negative cardio ROS Normal cardiovascular exam     Neuro/Psych negative neurological ROS  negative psych ROS   GI/Hepatic negative GI ROS, Neg liver ROS,   Endo/Other  negative endocrine ROSdiabetes, Gestational, Oral Hypoglycemic Agents  Renal/GU negative Renal ROS  negative genitourinary   Musculoskeletal negative musculoskeletal ROS (+)   Abdominal (+) + obese,   Peds negative pediatric ROS (+)  Hematology negative hematology ROS (+)   Anesthesia Other Findings   Reproductive/Obstetrics (+) Pregnancy                             Anesthesia Physical Anesthesia Plan  ASA: II  Anesthesia Plan: Epidural   Post-op Pain Management:    Induction:   Airway Management Planned:   Additional Equipment:   Intra-op Plan:   Post-operative Plan:   Informed Consent: I have reviewed the patients History and Physical, chart, labs and discussed the procedure including the risks, benefits and alternatives for the proposed anesthesia with the patient or authorized representative who has indicated his/her understanding and acceptance.     Plan Discussed with:   Anesthesia Plan Comments:         Anesthesia Quick Evaluation

## 2016-12-11 NOTE — MAU Note (Signed)
PT  SAYS SROM  AT 0500-  HAVING  SOME UC'S.   SAYS NO VE IN OFFICE .  DENIES HSV AND MRSA.  Marland Kitchen

## 2016-12-11 NOTE — H&P (Signed)
LABOR AND DELIVERY ADMISSION HISTORY AND PHYSICAL NOTE  Lorraine Nguyen is a 31 y.o. female G61P2002 with IUP at 55w1dby [place] IOL. Patient has a history for A2GDM on Glyburide. Patient had normal first trimester genetic screen and [place] quad positive patient declined NIPS.  She reports positive fetal movement. She denies leakage of fluid or vaginal bleeding.  Prenatal History/Complications:  Past Medical History: Past Medical History:  Diagnosis Date  . Gestational diabetes    glyburide  . Medical history non-contributory     Past Surgical History: Past Surgical History:  Procedure Laterality Date  . NO PAST SURGERIES      Obstetrical History: OB History    Gravida Para Term Preterm AB Living   _0 SAB TAB Ectopic Multiple Live Births           2      Social History: Social History   Social History  . Marital status: Single    Spouse name: N/A  . Number of children: N/A  . Years of education: N/A   Social History Main Topics  . Smoking status: Current Every Day Smoker    Packs/day: 0.25    Types: Cigarettes  . Smokeless tobacco: Current User  . Alcohol use No     Comment: socially   . Drug use: No  . Sexual activity: Yes   Other Topics Concern  . None   Social History Narrative  . None    Family History: Family History  Problem Relation Age of Onset  . Diabetes Maternal Grandmother   . Hypertension Maternal Grandmother   . Diabetes Paternal Grandmother   . Hypertension Paternal Grandfather     Allergies: No Known Allergies  Prescriptions Prior to Admission  Medication Sig Dispense Refill Last Dose  . flintstones complete (FLINTSTONES) 60 MG chewable tablet Chew 2 tablets by mouth daily.    12/10/2016 at Unknown time  . glyBURIDE (DIABETA) 2.5 MG tablet Take 1 tablet (2.5 mg total) by mouth 2 (two) times daily with a meal. 60 tablet 3 12/09/2016 at Unknown time  . Blood Glucose Monitoring Suppl (ACCU-CHEK NANO SMARTVIEW)  w/Device KIT 1 Device by Does not apply route 4 (four) times daily. 1 kit 0 Taking  . glucose blood test strip Use as instructed 100 each 12 Taking  . Lancets Misc. (ACCU-CHEK FASTCLIX LANCET) KIT 1 kit by Does not apply route 4 (four) times daily. 1 kit 10 Taking     Review of Systems   All systems reviewed and negative except as stated in HPI  Blood pressure 135/73, pulse 95, temperature 98.1 F (36.7 C), temperature source Oral, resp. rate 20, height _1  (1.88 m), weight 250 lb (113.4 kg), last menstrual period 03/19/2016, SpO2 100 %. General appearance: alert, cooperative and appears stated age Lungs: normal effort, no audible wheezing Heart: regular rate and pulses palpable distally Abdomen: soft, non-tender; Extremities: No calf swelling or tenderness Presentation: cephalic by nurse exam Fetal monitoring: FHR 140, mod variability, no decel Uterine activity: Irregular Dilation: 2 Effacement (%): 70 Station: -3 Exam by:: DDevoria GlassingRN   Prenatal labs: ABO, Rh: A/Positive/-- (09/18 1121) Antibody: Negative (09/18 1121) Rubella: Immune RPR: Non Reactive (01/23 1010)  HBsAg: Negative (09/18 1121)  HIV: Non Reactive (01/23 1010)  GBS: Positive (09/18 0000)  1 hr Glucola: 180 Genetic screening: first trimester normal, quad + (AFP 1:148) pt declined NIPS Anatomy [place] Normal  Prenatal Transfer Tool  Maternal Diabetes: Yes:  Diabetes Type:  Insulin/Medication controlled (Glyburide) Genetic Screening: Abnormal:  Results: Elevated AFP Maternal Ultrasounds/Referrals: Normal Fetal Ultrasounds or other Referrals:  None Maternal Substance Abuse:  No Significant Maternal Medications:  Meds include: Other: Glyburide Significant Maternal Lab Results: None  Results for orders placed or performed during the hospital encounter of 12/11/16 (from the past 24 hour(s))  CBC   Collection Time: 12/11/16  8:27 AM  Result Value Ref Range   WBC 13.3 (H) 4.0 - 10.5 K/uL   RBC 4.54 3.87 -  5.11 MIL/uL   Hemoglobin 12.5 12.0 - 15.0 g/dL   HCT 38.3 36.0 - 46.0 %   MCV 84.4 78.0 - 100.0 fL   MCH 27.5 26.0 - 34.0 pg   MCHC 32.6 30.0 - 36.0 g/dL   RDW 14.6 11.5 - 15.5 %   Platelets 238 150 - 400 K/uL  Glucose, capillary   Collection Time: 12/11/16  8:40 AM  Result Value Ref Range   Glucose-Capillary 89 65 - 99 mg/dL    Patient Active Problem List   Diagnosis Date Noted  . Gestational diabetes 10/09/2016  . Elevated AFP 09/24/2016  . Group B streptococcal bacteriuria 05/31/2016  . Supervision of high risk pregnancy, antepartum 05/26/2016    Assessment: Lorraine Nguyen is a 31 y.o. G3P2002 at 49w1dhere for SOL.  #Labor: expectant management #Pain: Epidural #FWB: Category 1 #ID:  GBS+, PCN #MOF: Bottle #MOC: Undetermined #Circ: Yes, outpatient  Lorraine Nguyen 12/11/2016, 9:06 AM  OB FELLOW HISTORY AND PHYSICAL ATTESTATION  I confirm that I have verified the information documented in the resident's note and that I have also personally reperformed the physical exam and all medical decision making activities.  NJacquiline Doe4/12/2016, 10:58 AM

## 2016-12-12 LAB — RPR: RPR: NONREACTIVE

## 2016-12-12 LAB — ABO/RH: ABO/RH(D): A POS

## 2016-12-12 LAB — GLUCOSE, CAPILLARY: Glucose-Capillary: 106 mg/dL — ABNORMAL HIGH (ref 65–99)

## 2016-12-12 NOTE — Progress Notes (Signed)
Post Partum Day #1 Subjective: no complaints, up ad lib, voiding and tolerating PO  Objective: Blood pressure 112/61, pulse 74, temperature 98.4 F (36.9 C), temperature source Oral, resp. rate 18, height  (1.88 m), weight 250 lb (113.4 kg), last menstrual period 03/19/2016, SpO2 99 %, unknown if currently breastfeeding.  Physical Exam:  General: alert, cooperative and no distress Lochia: appropriate Uterine Fundus: firm Incision: 1st deg; healing DVT Evaluation: No evidence of DVT seen on physical exam. No cords or calf tenderness. No significant calf/ankle edema.   Recent Labs  12/11/16 0827  HGB 12.5  HCT 38.3    Assessment/Plan: Plan for discharge tomorrow, Breastfeeding and Contraception undecided   LOS: 1 day   Roe Coombs, CNM 12/12/2016, 8:11 AM

## 2016-12-13 ENCOUNTER — Ambulatory Visit (HOSPITAL_COMMUNITY): Payer: Medicaid Other

## 2016-12-13 MED ORDER — IBUPROFEN 600 MG PO TABS: 600.0000 mg | ORAL_TABLET | Freq: Four times a day (QID) | ORAL | 0 refills | Status: DC

## 2016-12-13 NOTE — Discharge Instructions (Signed)
Contraception Choices °Contraception, also called birth control, means things to use or ways to try not to get pregnant. °Hormonal birth control °This kind of birth control uses hormones. Here are some types of hormonal birth control: °· A tube that is put under skin of the arm (implant). The tube can stay in for as long as 3 years. °· Shots to get every 3 months (injections). °· Pills to take every day (birth control pills). °· A patch to change 1 time each week for 3 weeks (birth control patch). After that, the patch is taken off for 1 week. °· A ring to put in the vagina. The ring is left in for 3 weeks. Then it is taken out of the vagina for 1 week. Then a new ring is put in. °· Pills to take after unprotected sex (emergency birth control pills). ° °Barrier birth control °Here are some types of barrier birth control: °· A thin covering that is put on the penis before sex (female condom). The covering is thrown away after sex. °· A soft, loose covering that is put in the vagina before sex (female condom). The covering is thrown away after sex. °· A rubber bowl that sits over the cervix (diaphragm). The bowl must be made for you. The bowl is put into the vagina before sex. The bowl is left in for 6-8 hours after sex. It is taken out within 24 hours. °· A small, soft cup that fits over the cervix (cervical cap). The cup must be made for you. The cup can be left in for 6-8 hours after sex. It is taken out within 48 hours. °· A sponge that is put into the vagina before sex. It must be left in for at least 6 hours after sex. It must be taken out within 30 hours. Then it is thrown away. °· A chemical that kills or stops sperm from getting into the uterus (spermicide). It may be a pill, cream, jelly, or foam to put in the vagina. The chemical should be used at least 10-15 minutes before sex. ° °IUD (intrauterine) birth control °An IUD is a small, T-shaped piece of plastic. It is put inside the uterus. There are two  kinds: °· Hormone IUD. This kind can stay in for 3-5 years. °· Copper IUD. This kind can stay in for 10 years. ° °Permanent birth control °Here are some types of permanent birth control: °· Surgery to block the fallopian tubes. °· Having an insert put into each fallopian tube. °· Surgery to tie off the tubes that carry sperm (vasectomy). ° °Natural planning birth control °Here are some types of natural planning birth control: °· Not having sex on the days the woman could get pregnant. °· Using a calendar: °? To keep track of the length of each period. °? To find out what days pregnancy can happen. °? To plan to not have sex on days when pregnancy can happen. °· Watching for symptoms of ovulation and not having sex during ovulation. One way the woman can check for ovulation is to check her temperature. °· Waiting to have sex until after ovulation. ° °Summary °· Contraception, also called birth control, means things to use or ways to try not to get pregnant. °· Hormonal methods of birth control include implants, injections, pills, patches, vaginal rings, and emergency birth control pills. °· Barrier methods of birth control can include female condoms, female condoms, diaphragms, cervical caps, sponges, and spermicides. °· There are two types of   IUD (intrauterine device) birth control. An IUD can be put in a woman's uterus to prevent pregnancy for 3-5 years. °· Permanent sterilization can be done through a procedure for males, females, or both. °· Natural planning methods involve not having sex on the days when the woman could get pregnant. °This information is not intended to replace advice given to you by your health care provider. Make sure you discuss any questions you have with your health care provider. °Document Released: 06/23/2009 Document Revised: 09/05/2016 Document Reviewed: 09/05/2016 °Elsevier Interactive Patient Education © 2017 [employer] °Home Care Instructions for Mom °ACTIVITY °· Gradually return to  your regular activities. °· Let yourself rest. Nap while your baby sleeps. °· Avoid lifting anything that is heavier than 10 lb (4.5 kg) until your health care provider says it is okay. °· Avoid activities that take a lot of effort and energy (are strenuous) until approved by your health care provider. Walking at a slow-to-moderate pace is usually safe. °· If you had a cesarean delivery: °? Do not vacuum, climb stairs, or drive a car for 4-6 weeks. °? Have someone help you at home until you feel like you can do your usual activities yourself. °? Do exercises as told by your health care provider, if this applies. ° °VAGINAL BLEEDING °You may continue to bleed for 4-6 weeks after delivery. Over time, the amount of blood usually decreases and the color of the blood usually gets lighter. However, the flow of bright red blood may increase if you have been too active. If you need to use more than one pad in an hour because your pad gets soaked, or if you pass a large clot: °· Lie down. °· Raise your feet. °· Place a cold compress on your lower abdomen. °· Rest. °· Call your health care provider. ° °If you are breastfeeding, your period should return anytime between 8 weeks after delivery and the time that you stop breastfeeding. If you are not breastfeeding, your period should return 6-8 weeks after delivery. °PERINEAL CARE °The perineal area, or perineum, is the part of your body between your thighs. After delivery, this area needs special care. Follow these instructions as told by your health care provider. °· Take warm tub baths for 15-20 minutes. °· Use medicated pads and pain-relieving sprays and creams as told. °· Do not use tampons or douches until vaginal bleeding has stopped. °· Each time you go to the bathroom: °? Use a peri bottle. °? Change your pad. °? Use towelettes in place of toilet paper until your stitches have healed. °· Do Kegel exercises every day. Kegel exercises help to maintain the muscles that  support the vagina, bladder, and bowels. You can do these exercises while you are standing, sitting, or lying down. To do Kegel exercises: °? Tighten the muscles of your abdomen and the muscles that surround your birth canal. °? Hold for a few seconds. °? Relax. °? Repeat until you have done this 5 times in a row. °· To prevent hemorrhoids from developing or getting worse: °? Drink enough fluid to keep your urine clear or pale yellow. °? Avoid straining when having a bowel movement. °? Take over-the-counter medicines and stool softeners as told by your health care provider. ° °BREAST CARE °· Wear a tight-fitting bra. °· Avoid taking over-the-counter pain medicine for breast discomfort. °· Apply ice to the breasts to help with discomfort as needed: °? Put ice in a plastic bag. °? Place a towel between your   skin and the bag. °? Leave the ice on for 20 minutes or as told by your health care provider. ° °NUTRITION °· Eat a well-balanced diet. °· Do not try to lose weight quickly by cutting back on calories. °· Take your prenatal vitamins until your postpartum checkup or until your health care provider tells you to stop. ° °POSTPARTUM DEPRESSION °You may find yourself crying for no apparent reason and unable to cope with all of the changes that come with having a newborn. This mood is called postpartum depression. Postpartum depression happens because your hormone levels change after delivery. If you have postpartum depression, get support from your partner, friends, and family. If the depression does not go away on its own after several weeks, contact your health care provider. °BREAST SELF-EXAM °Do a breast self-exam each month, at the same time of the month. If you are breastfeeding, check your breasts just after a feeding, when your breasts are less full. If you are breastfeeding and your period has started, check your breasts on day 5, 6, or 7 of your period. °Report any lumps, bumps, or discharge to your health  care provider. Know that breasts are normally lumpy if you are breastfeeding. This is temporary, and it is not a health risk. °INTIMACY AND SEXUALITY °Avoid sexual activity for at least 3-4 weeks after delivery or until the brownish-red vaginal flow is completely gone. If you want to avoid pregnancy, use some form of birth control. You can get pregnant after delivery, even if you have not had your period. °SEEK MEDICAL CARE IF: °· You feel unable to cope with the changes that a child brings to your life, and these feelings do not go away after several weeks. °· You notice a lump, a bump, or discharge on your breast. ° °SEEK IMMEDIATE MEDICAL CARE IF: °· Blood soaks your pad in 1 hour or less. °· You have: °? Severe pain or cramping in your lower abdomen. °? A bad-smelling vaginal discharge. °? A fever that is not controlled by medicine. °? A fever, and an area of your breast is red and sore. °? Pain or redness in your calf. °? Sudden, severe chest pain. °? Shortness of breath. °? Painful or bloody urination. °? Problems with your vision. °· You vomit for 12 hours or longer. °· You develop a severe headache. °· You have serious thoughts about hurting yourself, your child, or anyone else. ° °This information is not intended to replace advice given to you by your health care provider. Make sure you discuss any questions you have with your health care provider. °Document Released: 08/23/2000 Document Revised: 02/01/2016 Document Reviewed: 02/27/2015 °Elsevier Interactive Patient Education © 2017 [employer] ° °

## 2016-12-13 NOTE — Discharge Summary (Signed)
OB Discharge Summary  Patient Name: Lorraine Nguyen DOB: Oct 10, 1985 MRN: [id number]  Date of admission: 12/11/2016 Delivering MD: Daralene Milch B   Date of discharge: 12/13/2016  Admitting diagnosis: WATER BROKE Intrauterine pregnancy: [redacted]w[redacted]d    Secondary diagnosis:Active Problems:   Vaginal delivery  Additional problems:+ GBS     Discharge diagnosis: Term Pregnancy Delivered and GDM A2                                                                       Complications: None  Hospital course:  Induction of Labor With Vaginal Delivery   31y.o. yo G3P3003 at 325w1das admitted to the hospital 12/11/2016 for induction of labor.  Indication for induction: A2 DM.  Patient had an uncomplicated labor course as follows: Membrane Rupture Time/Date: 5:00 AM ,12/11/2016   Intrapartum Procedures: Episiotomy: None [1]                                         Lacerations:  1st degree [2];Periurethral [8];Labial [10]  Patient had delivery of a Viable infant.  Information for the patient's newborn:  RiCherina, Dhillon0[[id number]]Delivery Method: Vag-Spont   12/11/2016  Details of delivery can be found in separate delivery note.  Patient had a routine postpartum course. Patient is discharged home 12/13/16.  Physical exam  Vitals:   12/11/16 1704 12/12/16 0200 12/12/16 0511 12/12/16 1852  BP: 100/75 (!) 111/58 112/61 (!) 105/54  Pulse: 80 87 74 80  Resp: _0 Temp: 98.2 F (36.8 C) 98.2 F (36.8 C) 98.4 F (36.9 C) 98.7 F (37.1 C)  TempSrc: Oral Oral Oral Oral  SpO2: 99%   96%  Weight:      Height:       General: alert Lochia: appropriate Uterine Fundus: firm Incision: N/A DVT Evaluation: No evidence of DVT seen on physical exam. Labs: Lab Results  Component Value Date   WBC 13.3 (H) 12/11/2016   HGB 12.5 12/11/2016   HCT 38.3 12/11/2016   MCV 84.4 12/11/2016   PLT 238 12/11/2016   CMP 12/15/2007  Glucose 95  BUN 3(L)  Creatinine 0.56  Sodium 137   Potassium 3.7  Chloride 105  CO2 26  Calcium 8.8  Total Protein 6.5  Total Bilirubin 0.2(L)  Alkaline Phos 134(H)  AST 13  ALT 13    Discharge instruction: per After Visit Summary and "Baby and Me Booklet".  After Visit Meds:  Allergies as of 12/13/2016   No Known Allergies     Medication List    STOP taking these medications   ACCU-CHEK FASTCLIX LANCET Kit   glucose blood test strip   glyBURIDE 2.5 MG tablet Commonly known as:  DIABETA     TAKE these medications   ACCU-CHEK NANO SMARTVIEW w/Device Kit 1 Device by Does not apply route 4 (four) times daily.   flintstones complete 60 MG chewable tablet Chew 2 tablets by mouth daily.   ibuprofen 600 MG tablet Commonly known as:  ADVIL,MOTRIN Take 1 tablet (600 mg total) by mouth every 6 (six) hours.  Diet: carb modified diet  Activity: Advance as tolerated. Pelvic rest for 6 weeks.   Outpatient follow up:6 weeks Follow up Appt:No future appointments. Follow up visit: No Follow-up on file.  Postpartum contraception: Abstinence  Newborn Data: Live born female  Birth Weight: 7 lb 4 oz (3289 g) APGAR: 9, 9  Baby Feeding: Bottle Disposition:home with mother   12/13/2016 Emily Filbert, MD

## 2016-12-13 NOTE — Progress Notes (Signed)
CSW consulted this family after nursing staff overheard father telling baby to "shut up" on multiple occasions.  CSW to baby's room, both mother and father present.  CSW introduced self and explained role of CSW.  Mother and father of baby both receptive to visit.  Mother, father, and baby's two older half siblings, ages 5 and 8 live together.  Mother states that both maternal and paternal  families involved and supportive. Baby is first for father, who appeared restless at times.  Father stated he "got not sleep at all last night!" CSW talked about challenges and joys of caring for newborns and need for patience. Baby was crying when CSW entered the room and mother attempted to feed baby. After less than a minute, baby fell asleep and mother could not wake him to eat.  Father was calm throughout this exchange and took baby from mother to try to wake him as well.  Father excited to share that patient had a cousin born same day as baby and talked about how excited his family is about this.  CSW asked mother, "is he (father) ready for this?" and mother replied, "he's got no choice." Mother states that family has  all needed supplies for baby, no needs at present.  CSW offered support and congratulations.        Gerrie Nordmann, LCSW [phone]

## 2016-12-17 ENCOUNTER — Encounter: Payer: Medicaid Other | Admitting: Obstetrics and Gynecology

## 2016-12-17 ENCOUNTER — Inpatient Hospital Stay (HOSPITAL_COMMUNITY): Admission: RE | Admit: 2016-12-17 | Payer: Medicaid Other | Source: Ambulatory Visit

## 2016-12-24 ENCOUNTER — Encounter: Payer: Medicaid Other | Admitting: Obstetrics and Gynecology

## 2017-03-18 ENCOUNTER — Ambulatory Visit (HOSPITAL_COMMUNITY)
Admission: EM | Admit: 2017-03-18 | Discharge: 2017-03-18 | Disposition: A | Discharge status: Home / Self Care | Payer: Medicaid Other | Attending: Family Medicine | Admitting: Family Medicine

## 2017-03-18 ENCOUNTER — Encounter (HOSPITAL_COMMUNITY): Payer: Self-pay | Admitting: Emergency Medicine

## 2017-03-18 DIAGNOSIS — K047 Periapical abscess without sinus: Secondary | ICD-10-CM

## 2017-03-18 DIAGNOSIS — K0889 Other specified disorders of teeth and supporting structures: Secondary | ICD-10-CM

## 2017-03-18 MED ORDER — CLINDAMYCIN HCL 300 MG PO CAPS: 300.0000 mg | ORAL_CAPSULE | Freq: Four times a day (QID) | ORAL | 0 refills | Status: DC

## 2017-03-18 MED ORDER — TRAMADOL HCL 50 MG PO TABS: 50.0000 mg | ORAL_TABLET | Freq: Four times a day (QID) | ORAL | 0 refills | Status: DC | PRN

## 2017-03-18 NOTE — Discharge Instructions (Signed)
Return if any problems.  See the dentist as soon as possible

## 2017-03-18 NOTE — ED Triage Notes (Signed)
[facility] a [facility].

## 2017-03-19 NOTE — ED Provider Notes (Signed)
[facility]-EMERGENCY DEPT Provider Note   CSN: [id number] Arrival date & time: 03/18/17  1556     History   Chief Complaint Chief Complaint  Patient presents with  . Dental Pain    HPI Lorraine Nguyen is a 31 y.o. female.  The history is provided by the patient. No language interpreter was used.  Dental Pain   This is a new problem. The problem occurs constantly. The problem has been resolved. The pain is moderate. She has tried nothing for the symptoms. The treatment provided moderate relief.  Pt complains of multiple bad teeth.    Past Medical History:  Diagnosis Date  . Gestational diabetes    glyburide  . Medical history non-contributory     Patient Active Problem List   Diagnosis Date Noted  . Vaginal delivery 12/11/2016  . Gestational diabetes 10/09/2016  . Elevated AFP 09/24/2016  . Group B streptococcal bacteriuria 05/31/2016  . Supervision of high risk pregnancy, antepartum 05/26/2016    Past Surgical History:  Procedure Laterality Date  . NO PAST SURGERIES      OB History    Gravida Para Term Preterm AB Living   3 3 3     3    SAB TAB Ectopic Multiple Live Births         0 3       Home Medications    Prior to Admission medications   Medication Sig Start Date End Date Taking? Authorizing Provider  clindamycin (CLEOCIN) 300 MG capsule Take 1 capsule (300 mg total) by mouth every 6 (six) hours. 03/18/17   Elson Areas, PA-C  traMADol (ULTRAM) 50 MG tablet Take 1 tablet (50 mg total) by mouth every 6 (six) hours as needed. 03/18/17   Elson Areas, PA-C    Family History Family History  Problem Relation Age of Onset  . Diabetes Maternal Grandmother   . Hypertension Maternal Grandmother   . Diabetes Paternal Grandmother   . Hypertension Paternal Grandfather     Social History Social History  Substance Use Topics  . Smoking status: Current Every Day Smoker    Packs/day: 0.25    Types: Cigarettes  . Smokeless tobacco: Current User  .  Alcohol use No     Comment: socially      Allergies   Vicodin [hydrocodone-acetaminophen]   Review of Systems Review of Systems  All other systems reviewed and are negative.    Physical Exam Updated Vital Signs BP 125/81 (BP Location: Left Arm)   Pulse 89   Temp 98.2 F (36.8 C) (Oral)   Resp 16   SpO2 98%   Breastfeeding? No   Physical Exam  Constitutional: She appears well-developed and well-nourished.  HENT:  Head: Normocephalic.  Right Ear: External ear normal.  Left Ear: External ear normal.  Mouth/Throat: Oropharynx is clear and moist.  Severe dental decay swollen gumline  Eyes: Conjunctivae and EOM are normal. Pupils are equal, round, and reactive to light.  Cardiovascular: Normal rate.   Pulmonary/Chest: Effort normal.  Musculoskeletal: Normal range of motion.  Neurological: She is alert.  Skin: Skin is warm.  Psychiatric: She has a normal mood and affect.  Vitals reviewed.    ED Treatments / Results  Labs (all labs ordered are listed, but only abnormal results are displayed) Labs Reviewed - No data to display  EKG  EKG Interpretation None       Radiology No results found.  Procedures Procedures (including critical care time)  Medications Ordered in ED  Medications - No data to display   Initial Impression / Assessment and Plan / ED Course  I have reviewed the triage vital signs and the nursing notes.  Pertinent labs & imaging results that were available during my care of the patient were reviewed by me and considered in my medical decision making (see chart for details).       Final Clinical Impressions(s) / ED Diagnoses   Final diagnoses:  Dental abscess    New Prescriptions Discharge Medication List as of 03/18/2017  5:35 PM    START taking these medications   Details  clindamycin (CLEOCIN) 300 MG capsule Take 1 capsule (300 mg total) by mouth every 6 (six) hours., Starting Tue 03/18/2017, Normal    traMADol (ULTRAM) 50  MG tablet Take 1 tablet (50 mg total) by mouth every 6 (six) hours as needed., Starting Tue 03/18/2017, Normal      An After Visit Summary was printed and given to the patient.   Elson AreasSofia, Kurstin Dimarzo K, [place]PA-C 03/19/17 [phone]

## 2017-04-30 ENCOUNTER — Inpatient Hospital Stay (HOSPITAL_COMMUNITY)
Admission: AD | Admit: 2017-04-30 | Discharge: 2017-04-30 | Disposition: A | Discharge status: Home / Self Care | Payer: Medicaid Other | Source: Ambulatory Visit | Attending: Obstetrics and Gynecology | Admitting: Obstetrics and Gynecology

## 2017-04-30 ENCOUNTER — Encounter (HOSPITAL_COMMUNITY): Payer: Self-pay

## 2017-04-30 DIAGNOSIS — R109 Unspecified abdominal pain: Secondary | ICD-10-CM | POA: Insufficient documentation

## 2017-04-30 DIAGNOSIS — N898 Other specified noninflammatory disorders of vagina: Secondary | ICD-10-CM

## 2017-04-30 DIAGNOSIS — N760 Acute vaginitis: Secondary | ICD-10-CM | POA: Diagnosis not present

## 2017-04-30 DIAGNOSIS — F17210 Nicotine dependence, cigarettes, uncomplicated: Secondary | ICD-10-CM | POA: Insufficient documentation

## 2017-04-30 DIAGNOSIS — B9689 Other specified bacterial agents as the cause of diseases classified elsewhere: Secondary | ICD-10-CM | POA: Insufficient documentation

## 2017-04-30 DIAGNOSIS — R102 Pelvic and perineal pain: Secondary | ICD-10-CM

## 2017-04-30 DIAGNOSIS — Z3202 Encounter for pregnancy test, result negative: Secondary | ICD-10-CM | POA: Insufficient documentation

## 2017-04-30 LAB — WET PREP, GENITAL
Sperm: NONE SEEN
Trich, Wet Prep: NONE SEEN
YEAST WET PREP: NONE SEEN

## 2017-04-30 LAB — URINALYSIS, ROUTINE W REFLEX MICROSCOPIC
Bilirubin Urine: NEGATIVE
Glucose, UA: NEGATIVE mg/dL
HGB URINE DIPSTICK: NEGATIVE
Ketones, ur: NEGATIVE mg/dL
Nitrite: NEGATIVE
PROTEIN: 30 mg/dL — AB
SPECIFIC GRAVITY, URINE: 1.028 (ref 1.005–1.030)
pH: 6 (ref 5.0–8.0)

## 2017-04-30 LAB — POCT PREGNANCY, URINE: PREG TEST UR: NEGATIVE

## 2017-04-30 MED ORDER — METRONIDAZOLE 500 MG PO TABS: 500.0000 mg | ORAL_TABLET | Freq: Two times a day (BID) | ORAL | 0 refills | Status: AC
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
## 2017-04-30 NOTE — Discharge Instructions (Signed)
Safe Sex Practicing safe sex means taking steps before and during sex to reduce your risk of:  Getting an STD (sexually transmitted disease).  Giving your partner an STD.  Unwanted pregnancy.  How can I practice safe sex?  To practice safe sex:  Limit your sexual partners to only one partner who is having sex with only you.  Avoid using alcohol and recreational drugs before having sex. These substances can affect your judgment.  Before having sex with a new partner: ? Talk to your partner about past partners, past STDs, and drug use. ? You and your partner should be screened for STDs and discuss the results with each other.  Check your body regularly for sores, blisters, rashes, or unusual discharge. If you notice any of these problems, visit your health care provider.  If you have symptoms of an infection or you are being treated for an STD, avoid sexual contact.  While having sex, use a condom. Make sure to: ? Use a condom every time you have vaginal, oral, or anal sex. Both females and males should wear condoms during oral sex. ? Keep condoms in place from the beginning to the end of sexual activity. ? Use a latex condom, if possible. Latex condoms offer the best protection. ? Use only water-based lubricants or oils to lubricate a condom. Using petroleum-based lubricants or oils will weaken the condom and increase the chance that it will break.  See your health care provider for regular screenings, exams, and tests for STDs.  Talk with your health care provider about the form of birth control (contraception) that is best for you.  Get vaccinated against hepatitis B and human papillomavirus (HPV).  If you are at risk of being infected with HIV (human immunodeficiency virus), talk with your health care provider about taking a prescription medicine to prevent HIV infection. You are considered at risk for HIV if: ? You are a man who has sex with other men. ? You are a  heterosexual man or woman who is sexually active with more than one partner. ? You take drugs by injection. ? You are sexually active with a partner who has HIV.  This information is not intended to replace advice given to you by your health care provider. Make sure you discuss any questions you have with your health care provider. Document Released: 10/03/2004 Document Revised: 01/10/2016 Document Reviewed: 07/16/2015 Elsevier Interactive Patient Education  2018 [employer]. Contraception Choices Contraception (birth control) is the use of any methods or devices to prevent pregnancy. Below are some methods to help avoid pregnancy. Hormonal methods  Contraceptive implant. This is a thin, plastic tube containing progesterone hormone. It does not contain estrogen hormone. Your health care provider inserts the tube in the inner part of the upper arm. The tube can remain in place for up to 3 years. After 3 years, the implant must be removed. The implant prevents the ovaries from releasing an egg (ovulation), thickens the cervical mucus to prevent sperm from entering the uterus, and thins the lining of the inside of the uterus.  Progesterone-only injections. These injections are given every 3 months by your health care provider to prevent pregnancy. This synthetic progesterone hormone stops the ovaries from releasing eggs. It also thickens cervical mucus and changes the uterine lining. This makes it harder for sperm to survive in the uterus.  Birth control pills. These pills contain estrogen and progesterone hormone. They work by preventing the ovaries from releasing eggs (ovulation). They also  cause the cervical mucus to thicken, preventing the sperm from entering the uterus. Birth control pills are prescribed by a health care provider.Birth control pills can also be used to treat heavy periods.  Minipill. This type of birth control pill contains only the progesterone hormone. They are taken every  day of each month and must be prescribed by your health care provider.  Birth control patch. The patch contains hormones similar to those in birth control pills. It must be changed once a week and is prescribed by a health care provider.  Vaginal ring. The ring contains hormones similar to those in birth control pills. It is left in the vagina for 3 weeks, removed for 1 week, and then a new one is put back in place. The patient must be comfortable inserting and removing the ring from the vagina.A health care provider's prescription is necessary.  Emergency contraception. Emergency contraceptives prevent pregnancy after unprotected sexual intercourse. This pill can be taken right after sex or up to 5 days after unprotected sex. It is most effective the sooner you take the pills after having sexual intercourse. Most emergency contraceptive pills are available without a prescription. Check with your pharmacist. Do not use emergency contraception as your only form of birth control. Barrier methods  Female condom. This is a thin sheath (latex or rubber) that is worn over the penis during sexual intercourse. It can be used with spermicide to increase effectiveness.  Female condom. This is a soft, loose-fitting sheath that is put into the vagina before sexual intercourse.  Diaphragm. This is a soft, latex, dome-shaped barrier that must be fitted by a health care provider. It is inserted into the vagina, along with a spermicidal jelly. It is inserted before intercourse. The diaphragm should be left in the vagina for 6 to 8 hours after intercourse.  Cervical cap. This is a round, soft, latex or plastic cup that fits over the cervix and must be fitted by a health care provider. The cap can be left in place for up to 48 hours after intercourse.  Sponge. This is a soft, circular piece of polyurethane foam. The sponge has spermicide in it. It is inserted into the vagina after wetting it and before sexual  intercourse.  Spermicides. These are chemicals that kill or block sperm from entering the cervix and uterus. They come in the form of creams, jellies, suppositories, foam, or tablets. They do not require a prescription. They are inserted into the vagina with an applicator before having sexual intercourse. The process must be repeated every time you have sexual intercourse. Intrauterine contraception  Intrauterine device (IUD). This is a T-shaped device that is put in a woman's uterus during a menstrual period to prevent pregnancy. There are 2 types: ? Copper IUD. This type of IUD is wrapped in copper wire and is placed inside the uterus. Copper makes the uterus and fallopian tubes produce a fluid that kills sperm. It can stay in place for 10 years. ? Hormone IUD. This type of IUD contains the hormone progestin (synthetic progesterone). The hormone thickens the cervical mucus and prevents sperm from entering the uterus, and it also thins the uterine lining to prevent implantation of a fertilized egg. The hormone can weaken or kill the sperm that get into the uterus. It can stay in place for 3-5 years, depending on which type of IUD is used. Permanent methods of contraception  Female tubal ligation. This is when the woman's fallopian tubes are surgically sealed, tied, or  blocked to prevent the egg from traveling to the uterus.  Hysteroscopic sterilization. This involves placing a small coil or insert into each fallopian tube. Your doctor uses a technique called hysteroscopy to do the procedure. The device causes scar tissue to form. This results in permanent blockage of the fallopian tubes, so the sperm cannot fertilize the egg. It takes about 3 months after the procedure for the tubes to become blocked. You must use another form of birth control for these 3 months.  Female sterilization. This is when the female has the tubes that carry sperm tied off (vasectomy).This blocks sperm from entering the vagina  during sexual intercourse. After the procedure, the man can still ejaculate fluid (semen). Natural planning methods  Natural family planning. This is not having sexual intercourse or using a barrier method (condom, diaphragm, cervical cap) on days the woman could become pregnant.  Calendar method. This is keeping track of the length of each menstrual cycle and identifying when you are fertile.  Ovulation method. This is avoiding sexual intercourse during ovulation.  Symptothermal method. This is avoiding sexual intercourse during ovulation, using a thermometer and ovulation symptoms.  Post-ovulation method. This is timing sexual intercourse after you have ovulated. Regardless of which type or method of contraception you choose, it is important that you use condoms to protect against the transmission of sexually transmitted infections (STIs). Talk with your health care provider about which form of contraception is most appropriate for you. This information is not intended to replace advice given to you by your health care provider. Make sure you discuss any questions you have with your health care provider. Document Released: 08/26/2005 Document Revised: 02/01/2016 Document Reviewed: 02/18/2013 Elsevier Interactive Patient Education  2017 Elsevier Inc. Bacterial Vaginosis Bacterial vaginosis is a vaginal infection that occurs when the normal balance of bacteria in the vagina is disrupted. It results from an overgrowth of certain bacteria. This is the most common vaginal infection among women ages 43-44. Because bacterial vaginosis increases your risk for STIs (sexually transmitted infections), getting treated can help reduce your risk for chlamydia, gonorrhea, herpes, and HIV (human immunodeficiency virus). Treatment is also important for preventing complications in pregnant women, because this condition can cause an early (premature) delivery. What are the causes? This condition is caused by an  increase in harmful bacteria that are normally present in small amounts in the vagina. However, the reason that the condition develops is not fully understood. What increases the risk? The following factors may make you more likely to develop this condition:  Having a new sexual partner or multiple sexual partners.  Having unprotected sex.  Douching.  Having an intrauterine device (IUD).  Smoking.  Drug and alcohol abuse.  Taking certain antibiotic medicines.  Being pregnant.  You cannot get bacterial vaginosis from toilet seats, bedding, swimming pools, or contact with objects around you. What are the signs or symptoms? Symptoms of this condition include:  Grey or white vaginal discharge. The discharge can also be watery or foamy.  A fish-like odor with discharge, especially after sexual intercourse or during menstruation.  Itching in and around the vagina.  Burning or pain with urination.  Some women with bacterial vaginosis have no signs or symptoms. How is this diagnosed? This condition is diagnosed based on:  Your medical history.  A physical exam of the vagina.  Testing a sample of vaginal fluid under a microscope to look for a large amount of bad bacteria or abnormal cells. Your health care provider  may use a cotton swab or a small wooden spatula to collect the sample.  How is this treated? This condition is treated with antibiotics. These may be given as a pill, a vaginal cream, or a medicine that is put into the vagina (suppository). If the condition comes back after treatment, a second round of antibiotics may be needed. Follow these instructions at home: Medicines  Take over-the-counter and prescription medicines only as told by your health care provider.  Take or use your antibiotic as told by your health care provider. Do not stop taking or using the antibiotic even if you start to feel better. General instructions  If you have a female sexual partner,  tell her that you have a vaginal infection. She should see her health care provider and be treated if she has symptoms. If you have a female sexual partner, he does not need treatment.  During treatment: ? Avoid sexual activity until you finish treatment. ? Do not douche. ? Avoid alcohol as directed by your health care provider. ? Avoid breastfeeding as directed by your health care provider.  Drink enough water and fluids to keep your urine clear or pale yellow.  Keep the area around your vagina and rectum clean. ? Wash the area daily with warm water. ? Wipe yourself from front to back after using the toilet.  Keep all follow-up visits as told by your health care provider. This is important. How is this prevented?  Do not douche.  Wash the outside of your vagina with warm water only.  Use protection when having sex. This includes latex condoms and dental dams.  Limit how many sexual partners you have. To help prevent bacterial vaginosis, it is best to have sex with just one partner (monogamous).  Make sure you and your sexual partner are tested for STIs.  Wear cotton or cotton-lined underwear.  Avoid wearing tight pants and pantyhose, especially during summer.  Limit the amount of alcohol that you drink.  Do not use any products that contain nicotine or tobacco, such as cigarettes and e-cigarettes. If you need help quitting, ask your health care provider.  Do not use illegal drugs. Where to find more information:  Centers for Disease Control and Prevention: SolutionApps.co.za  American Sexual Health Association (ASHA): www.ashastd.org  U.S. Department of Health and Health and safety inspector, Office on Women's Health: ConventionalMedicines.si or http://www.anderson-williamson.info/ Contact a health care provider if:  Your symptoms do not improve, even after treatment.  You have more discharge or pain when urinating.  You have a fever.  You have pain in your  abdomen.  You have pain during sex.  You have vaginal bleeding between periods. Summary  Bacterial vaginosis is a vaginal infection that occurs when the normal balance of bacteria in the vagina is disrupted.  Because bacterial vaginosis increases your risk for STIs (sexually transmitted infections), getting treated can help reduce your risk for chlamydia, gonorrhea, herpes, and HIV (human immunodeficiency virus). Treatment is also important for preventing complications in pregnant women, because the condition can cause an early (premature) delivery.  This condition is treated with antibiotic medicines. These may be given as a pill, a vaginal cream, or a medicine that is put into the vagina (suppository). This information is not intended to replace advice given to you by your health care provider. Make sure you discuss any questions you have with your health care provider. Document Released: 08/26/2005 Document Revised: 05/11/2016 Document Reviewed: 05/11/2016 Elsevier Interactive Patient Education  2017 [employer].
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
## 2017-04-30 NOTE — MAU Provider Note (Signed)
Chief Complaint:  Abdominal Pain   First Provider Initiated Contact with Patient 04/30/17 1627      HPI: Lorraine Nguyen is a 31 y.o. [id number] who presents to maternity admissions reporting suprapubic pain since bumping her pelvis on a scooter 2 days ago.  Also has vaginal itching and concern for STI.Marland Kitchen  Started spotting yesterday, worried she is pregnant.  Not on birth control, just delivered a few months ago, She reports vaginal bleeding, vaginal itching/burning, urinary symptoms, h/a, dizziness, n/v, or fever/chills.    Abdominal Pain  This is a new problem. The current episode started yesterday. The onset quality is gradual. The problem occurs intermittently. The problem has been unchanged. The pain is located in the suprapubic region. The pain is mild. The quality of the pain is aching and dull. The abdominal pain does not radiate. Pertinent negatives include no constipation, diarrhea, fever, headaches, myalgias, nausea or vomiting. The pain is aggravated by palpation. The pain is relieved by nothing. She has tried nothing for the symptoms.   RN Note: Pt presents with c/o lower abdominal pain.  States hit abdomen while riding scooter.  Pt also c/o vaginal itching, began 2 days ago.  States having small amt VB that began 2 days ago too.  Past Medical History: Past Medical History:  Diagnosis Date  . Gestational diabetes    glyburide  . Medical history non-contributory     Past obstetric history: OB History  Gravida Para Term Preterm AB Living  3 3 3     3   SAB TAB Ectopic Multiple Live Births        0 3    # Outcome Date GA Lbr Len/2nd Weight Sex Delivery Anes PTL Lv  3 Term 12/11/16 [redacted]w[redacted]d 05:02 / 00:01 7 lb 4 oz (3.289 kg) M Vag-Spont None  LIV  2 Term 01/19/08 [redacted]w[redacted]d  7 lb 6 oz (3.345 kg) M Vag-Spont   LIV  1 Term 06/05/06 [redacted]w[redacted]d  6 lb 7 oz (2.92 kg) F Vag-Spont   LIV     Complications: Hypertension affecting pregnancy      Past Surgical History: Past Surgical History:   Procedure Laterality Date  . NO PAST SURGERIES      Family History: Family History  Problem Relation Age of Onset  . Diabetes Maternal Grandmother   . Hypertension Maternal Grandmother   . Diabetes Paternal Grandmother   . Hypertension Paternal Grandfather     Social History: Social History  Substance Use Topics  . Smoking status: Current Every Day Smoker    Packs/day: 0.25    Types: Cigarettes  . Smokeless tobacco: Current User  . Alcohol use No     Comment: socially     Allergies:  Allergies  Allergen Reactions  . Vicodin [Hydrocodone-Acetaminophen] Itching  . Ibuprofen Itching    Meds:  Prescriptions Prior to Admission  Medication Sig Dispense Refill Last Dose  . clindamycin (CLEOCIN) 300 MG capsule Take 1 capsule (300 mg total) by mouth every 6 (six) hours. 28 capsule 0   . traMADol (ULTRAM) 50 MG tablet Take 1 tablet (50 mg total) by mouth every 6 (six) hours as needed. 15 tablet 0     I have reviewed patient's Past Medical Hx, Surgical Hx, Family Hx, Social Hx, medications and allergies.  ROS:  Review of Systems  Constitutional: Negative for fever.  Gastrointestinal: Positive for abdominal pain. Negative for constipation, diarrhea, nausea and vomiting.  Musculoskeletal: Negative for myalgias.  Neurological: Negative for headaches.  Other systems negative     Physical Exam  Patient Vitals for the past 24 hrs:  BP Temp Temp src Pulse Resp SpO2 Height Weight  04/30/17 1606 131/85 98.9 F (37.2 C) Oral 95 16 99 % 6\' 2"  (1.88 m) 240 lb 4 oz (109 kg)   Constitutional: Well-developed, well-nourished female in no acute distress.  Cardiovascular: normal rate and rhythm, no ectopy audible, S1 & S2 heard, no murmur Respiratory: normal effort, no distress. Lungs CTAB with no wheezes or crackles GI: Abd soft, non-tender.  Nondistended.  No rebound, No guarding.  Bowel Sounds audible  MS: Extremities nontender, no edema, normal ROM Neurologic: Alert and  oriented x 4.   Grossly nonfocal. GU: Neg CVAT. Skin:  Warm and Dry Psych:  Affect appropriate.  PELVIC EXAM: Cervix pink, visually closed, without lesion, scant white creamy discharge, vaginal walls and external genitalia normal   Some erethema of vulva. Bimanual exam: Cervix firm, anterior, neg CMT, uterus nontender, nonenlarged, adnexa without tenderness, enlargement, or mass    Labs: --/--/A POS, A POS (04/04 0827) Results for orders placed or performed during the hospital encounter of 04/30/17 (from the past 24 hour(s))  Urinalysis, Routine w reflex microscopic     Status: Abnormal   Collection Time: 04/30/17  4:08 PM  Result Value Ref Range   Color, Urine YELLOW YELLOW   APPearance CLEAR CLEAR   Specific Gravity, Urine 1.028 1.005 - 1.030   pH 6.0 5.0 - 8.0   Glucose, UA NEGATIVE NEGATIVE mg/dL   Hgb urine dipstick NEGATIVE NEGATIVE   Bilirubin Urine NEGATIVE NEGATIVE   Ketones, ur NEGATIVE NEGATIVE mg/dL   Protein, ur 30 (A) NEGATIVE mg/dL   Nitrite NEGATIVE NEGATIVE   Leukocytes, UA TRACE (A) NEGATIVE   RBC / HPF 0-5 0 - 5 RBC/hpf   WBC, UA 0-5 0 - 5 WBC/hpf   Bacteria, UA RARE (A) NONE SEEN   Squamous Epithelial / LPF 0-5 (A) NONE SEEN   Mucus PRESENT   Pregnancy, urine POC     Status: None   Collection Time: 04/30/17  4:19 PM  Result Value Ref Range   Preg Test, Ur NEGATIVE NEGATIVE  Wet prep, genital     Status: Abnormal   Collection Time: 04/30/17  4:41 PM  Result Value Ref Range   Yeast Wet Prep HPF POC NONE SEEN NONE SEEN   Trich, Wet Prep NONE SEEN NONE SEEN   Clue Cells Wet Prep HPF POC PRESENT (A) NONE SEEN   WBC, Wet Prep HPF POC FEW (A) NONE SEEN   Sperm NONE SEEN     Imaging:  No results found.  MAU Course/MDM: I have ordered labs as follows: see above Imaging ordered: none Results reviewed. Discussed BV results.  Reviewed treatment.  Discussed need for contraception if does not want to become pregnant Discussed safe sex recommendations Pt  stable at time of discharge.  Assessment: Bacterial vaginosis - Plan: Discharge patient  Pelvic pain - Plan: Discharge patient  Vagina itching - Plan: Discharge patient   Plan: Discharge home Recommend safe sex, call office for appointment Rx sent for Flagyl for BV.  Alcohol precautions.   Encouraged to return here or to other Urgent Care/ED if she develops worsening of symptoms, increase in pain, fever, or other concerning symptoms.   Wynelle Bourgeois CNM, MSN Certified Nurse-Midwife 04/30/2017 4:28 PM

## 2017-05-01 LAB — GC/CHLAMYDIA PROBE AMP (~~LOC~~) NOT AT ARMC
Chlamydia: NEGATIVE
NEISSERIA GONORRHEA: NEGATIVE

## 2017-07-07 ENCOUNTER — Encounter (HOSPITAL_COMMUNITY): Payer: Self-pay | Admitting: Nurse Practitioner

## 2017-07-07 ENCOUNTER — Ambulatory Visit (HOSPITAL_COMMUNITY)
Admission: EM | Admit: 2017-07-07 | Discharge: 2017-07-07 | Disposition: A | Discharge status: Home / Self Care | Payer: Medicaid Other | Attending: Physician Assistant | Admitting: Physician Assistant

## 2017-07-07 ENCOUNTER — Emergency Department (HOSPITAL_COMMUNITY)
Admission: EM | Admit: 2017-07-07 | Discharge: 2017-07-07 | Disposition: A | Discharge status: Home / Self Care | Payer: Medicaid Other | Attending: Emergency Medicine | Admitting: Emergency Medicine

## 2017-07-07 ENCOUNTER — Encounter (HOSPITAL_COMMUNITY): Payer: Self-pay | Admitting: Emergency Medicine

## 2017-07-07 DIAGNOSIS — K0889 Other specified disorders of teeth and supporting structures: Secondary | ICD-10-CM | POA: Insufficient documentation

## 2017-07-07 DIAGNOSIS — Z5321 Procedure and treatment not carried out due to patient leaving prior to being seen by health care provider: Secondary | ICD-10-CM | POA: Insufficient documentation

## 2017-07-07 DIAGNOSIS — K029 Dental caries, unspecified: Secondary | ICD-10-CM

## 2017-07-07 MED ORDER — PENICILLIN V POTASSIUM 500 MG PO TABS: 500.0000 mg | ORAL_TABLET | Freq: Three times a day (TID) | ORAL | 0 refills | Status: AC

## 2017-07-07 MED ORDER — TRAMADOL HCL 50 MG PO TABS: 50.0000 mg | ORAL_TABLET | Freq: Four times a day (QID) | ORAL | 0 refills | Status: DC | PRN

## 2017-07-07 NOTE — ED Notes (Signed)
Bed: WTR6 Expected date:  Expected time:  Means of arrival:  Comments: 

## 2017-07-07 NOTE — ED Triage Notes (Signed)
Pt is c/o of right lower dental pain and swelling, states it has been an ongoing problem that worsened tonight.

## 2017-07-07 NOTE — ED Triage Notes (Signed)
Pt here for right lower dental pain

## 2017-07-07 NOTE — ED Provider Notes (Signed)
[facility]    CSN: [id number] Arrival date & time: 07/07/17  1023     History   Chief Complaint Chief Complaint  Patient presents with  . Dental Pain    HPI Lorraine Nguyen is a 31 y.o. female.   31 year-old female, presenting today complaining of dental pain. Patient states that she had right lower dental pain for the past 5 days. States that she was at [facility] ED last might but left due to the long wait. She is complaining of pain on the affected side with eating. Denies fever, chills, nausea, vomiting, swelling or fullness in the floor of the mouth.  States that she is supposed to have that tooth pulled but had to cancel the appointment due to work    The history is provided by the patient.  Dental Pain  Location:  Lower Lower teeth location:  31/RL 2nd molar Quality:  Aching Severity:  Moderate Onset quality:  Gradual Duration:  5 days Timing:  Constant Progression:  Unchanged Chronicity:  Recurrent Context: dental caries and poor dentition   Context: not abscess, cap still on and not crown fracture   Previous work-up:  Dental exam Relieved by:  Nothing Worsened by:  Hot food/drink, cold food/drink and touching Ineffective treatments:  Acetaminophen and NSAIDs Associated symptoms: gum swelling   Associated symptoms: no congestion, no difficulty swallowing, no drooling, no facial pain, no facial swelling, no fever, no headaches, no neck pain, no neck swelling, no oral bleeding, no oral lesions and no trismus   Risk factors: periodontal disease and smoking   Risk factors: no alcohol problem, no cancer and no chewing tobacco use     Past Medical History:  Diagnosis Date  . Gestational diabetes    glyburide  . Medical history non-contributory     Patient Active Problem List   Diagnosis Date Noted  . Vaginal delivery 12/11/2016  . Gestational diabetes 10/09/2016  . Elevated AFP 09/24/2016  . Group B streptococcal bacteriuria 05/31/2016  .  Supervision of high risk pregnancy, antepartum 05/26/2016    Past Surgical History:  Procedure Laterality Date  . NO PAST SURGERIES      OB History    Gravida Para Term Preterm AB Living   3 3 3     3    SAB TAB Ectopic Multiple Live Births         0 3       Home Medications    Prior to Admission medications   Medication Sig Start Date End Date Taking? Authorizing Provider  clindamycin (CLEOCIN) 300 MG capsule Take 1 capsule (300 mg total) by mouth every 6 (six) hours. 03/18/17   Elson Areas, PA-C  penicillin v potassium (VEETID) 500 MG tablet Take 1 tablet (500 mg total) by mouth 3 (three) times daily. 07/07/17 07/17/17  Rona Tomson C, PA-C  traMADol (ULTRAM) 50 MG tablet Take 1 tablet (50 mg total) by mouth every 6 (six) hours as needed. 07/07/17   Kymberley Raz, Marylene Land, PA-C    Family History Family History  Problem Relation Age of Onset  . Diabetes Maternal Grandmother   . Hypertension Maternal Grandmother   . Diabetes Paternal Grandmother   . Hypertension Paternal Grandfather     Social History Social History  Substance Use Topics  . Smoking status: Current Every Day Smoker    Packs/day: 0.25    Types: Cigarettes  . Smokeless tobacco: Current User  . Alcohol use No     Comment: socially  Allergies   Vicodin [hydrocodone-acetaminophen] and Ibuprofen   Review of Systems Review of Systems  Constitutional: Negative for chills and fever.  HENT: Positive for dental problem. Negative for congestion, drooling, ear pain, facial swelling, mouth sores and sore throat.   Eyes: Negative for pain and visual disturbance.  Respiratory: Negative for cough and shortness of breath.   Cardiovascular: Negative for chest pain and palpitations.  Gastrointestinal: Negative for abdominal pain and vomiting.  Genitourinary: Negative for dysuria and hematuria.  Musculoskeletal: Negative for arthralgias, back pain and neck pain.  Skin: Negative for color change and rash.    Neurological: Negative for seizures, syncope and headaches.  All other systems reviewed and are negative.    Physical Exam Triage Vital Signs ED Triage Vitals [07/07/17 1117]  Enc Vitals Group     BP 125/79     Pulse Rate 87     Resp 18     Temp 98.8 F (37.1 C)     Temp Source Oral     SpO2 98 %     Weight      Height      Head Circumference      Peak Flow      Pain Score 10     Pain Loc      Pain Edu?      Excl. in GC?    No data found.   Updated Vital Signs BP 125/79 (BP Location: Right Arm)   Pulse 87   Temp 98.8 F (37.1 C) (Oral)   Resp 18   SpO2 98%   Visual Acuity Right Eye Distance:   Left Eye Distance:   Bilateral Distance:    Right Eye Near:   Left Eye Near:    Bilateral Near:     Physical Exam  Constitutional: She is oriented to person, place, and time. She appears well-developed and well-nourished. No distress.  HENT:  Head: Normocephalic.  Mouth/Throat: Uvula is midline and oropharynx is clear and moist. She does not have dentures. No oral lesions. No trismus in the jaw. Normal dentition. Dental caries present. No dental abscesses, uvula swelling or lacerations.    Dental caries to the affected tooth. No abscess amenable to drainage   Eyes: Pupils are equal, round, and reactive to light. EOM are normal.  Neck: Normal range of motion.  Cardiovascular: Normal rate.   Pulmonary/Chest: Effort normal and breath sounds normal.  Abdominal: Soft.  Musculoskeletal: Normal range of motion.  Neurological: She is alert and oriented to person, place, and time.  Skin: Skin is warm. She is not diaphoretic.  Psychiatric: She has a normal mood and affect.  Nursing note and vitals reviewed.    UC Treatments / Results  Labs (all labs ordered are listed, but only abnormal results are displayed) Labs Reviewed - No data to display  EKG  EKG Interpretation None       Radiology No results found.  Procedures Procedures (including critical care  time)  Medications Ordered in UC Medications - No data to display   Initial Impression / Assessment and Plan / UC Course  I have reviewed the triage vital signs and the nursing notes.  Pertinent labs & imaging results that were available during my care of the patient were reviewed by me and considered in my medical decision making (see chart for details).     Dental caries. No abscess or evidence of Ludwig's angina  Final Clinical Impressions(s) / UC Diagnoses   Final diagnoses:  Pain due to  dental caries    New Prescriptions New Prescriptions   PENICILLIN V POTASSIUM (VEETID) 500 MG TABLET    Take 1 tablet (500 mg total) by mouth 3 (three) times daily.   TRAMADOL (ULTRAM) 50 MG TABLET    Take 1 tablet (50 mg total) by mouth every 6 (six) hours as needed.     Controlled Substance Prescriptions [place] Controlled Substance Registry consulted? Not Applicable   Alecia LemmingBlue, Morgyn Marut C, [place] JerseyPA-C 07/07/17 1130

## 2018-03-03 ENCOUNTER — Ambulatory Visit: Payer: Medicaid Other | Admitting: Obstetrics

## 2018-03-03 ENCOUNTER — Encounter: Payer: Self-pay | Admitting: Obstetrics

## 2018-03-03 ENCOUNTER — Other Ambulatory Visit: Payer: Self-pay

## 2018-03-03 VITALS — BP 134/86 | HR 76 | Ht 74.0 in | Wt 242.8 lb

## 2018-03-03 DIAGNOSIS — Z3042 Encounter for surveillance of injectable contraceptive: Secondary | ICD-10-CM | POA: Diagnosis not present

## 2018-03-03 DIAGNOSIS — Z3202 Encounter for pregnancy test, result negative: Secondary | ICD-10-CM | POA: Diagnosis not present

## 2018-03-03 DIAGNOSIS — IMO0001 Reserved for inherently not codable concepts without codable children: Secondary | ICD-10-CM

## 2018-03-03 DIAGNOSIS — F17210 Nicotine dependence, cigarettes, uncomplicated: Secondary | ICD-10-CM

## 2018-03-03 LAB — POCT URINE PREGNANCY: Preg Test, Ur: NEGATIVE

## 2018-03-03 MED ORDER — MEDROXYPROGESTERONE ACETATE 150 MG/ML IM SUSP: 150.0000 mg | INTRAMUSCULAR | 4 refills | Status: DC

## 2018-03-03 MED ORDER — MEDROXYPROGESTERONE ACETATE 150 MG/ML IM SUSP
150.0000 mg | Freq: Once | INTRAMUSCULAR | Status: AC
  Administered 2018-03-03: 150 mg via INTRAMUSCULAR

## 2018-03-03 NOTE — Progress Notes (Signed)
Presents for Contraception Consultation.  Patient had unprotected sex almost 2 weeks ago.  LMP 03/03/18. UPT done today is NEGATIVE.  DEPO given RUOQ, tolerated well.  Next DEPO 9/10-24/19  Administrations This Visit    medroxyPROGESTERone (DEPO-PROVERA) injection 150 mg    Admin Date 03/03/2018 Action Given Dose 150 mg Route Intramuscular Administered By Maretta BeesMcGlashan, Emmalea Treanor J, RMA

## 2018-03-03 NOTE — Progress Notes (Signed)
Subjective:    Lorraine Nguyen is a 32 y.o. female who presents for contraception counseling. The patient has no complaints today. The patient is sexually active. Pertinent past medical history: current smoker.  The information documented in the HPI was reviewed and verified.  Menstrual History: OB History    Gravida  3   Para  3   Term  3   Preterm      AB      Living  3     SAB      TAB      Ectopic      Multiple  0   Live Births  3            Patient's last menstrual period was 03/03/2018 (exact date).   Patient Active Problem List   Diagnosis Date Noted  . Vaginal delivery 12/11/2016  . Gestational diabetes 10/09/2016  . Elevated AFP 09/24/2016  . Group B streptococcal bacteriuria 05/31/2016  . Supervision of high risk pregnancy, antepartum 05/26/2016   Past Medical History:  Diagnosis Date  . Gestational diabetes    glyburide  . Medical history non-contributory     Past Surgical History:  Procedure Laterality Date  . NO PAST SURGERIES       Current Outpatient Medications:  .  clindamycin (CLEOCIN) 300 MG capsule, Take 1 capsule (300 mg total) by mouth every 6 (six) hours., Disp: 28 capsule, Rfl: 0 .  medroxyPROGESTERone (DEPO-PROVERA) 150 MG/ML injection, Inject 1 mL (150 mg total) into the muscle every 3 (three) months., Disp: 1 mL, Rfl: 4 .  traMADol (ULTRAM) 50 MG tablet, Take 1 tablet (50 mg total) by mouth every 6 (six) hours as needed., Disp: 15 tablet, Rfl: 0 Allergies  Allergen Reactions  . Vicodin [Hydrocodone-Acetaminophen] Itching  . Ibuprofen Itching    Social History   Tobacco Use  . Smoking status: Current Every Day Smoker    Packs/day: 0.25    Types: Cigarettes  . Smokeless tobacco: Current User  Substance Use Topics  . Alcohol use: No    Comment: socially     Family History  Problem Relation Age of Onset  . Diabetes Maternal Grandmother   . Hypertension Maternal Grandmother   . Diabetes Paternal Grandmother   .  Hypertension Paternal Grandfather        Review of Systems Constitutional: negative for weight loss Genitourinary:negative for abnormal menstrual periods and vaginal discharge   Objective:   BP 134/86   Pulse 76   Ht 6\' 2"  (1.88 m)   Wt 242 lb 12.8 oz (110.1 kg)   LMP 03/03/2018 (Exact Date)   Breastfeeding? No   BMI 31.17 kg/m    General:   alert  Skin:   no rash or abnormalities  Lungs:   clear to auscultation bilaterally  Heart:   regular rate and rhythm, S1, S2 normal, no murmur, click, rub or gallop  Lab Review Urine pregnancy test:  Negative Labs reviewed yes Radiologic studies reviewed no  50% of 15 min visit spent on counseling and coordination of care.    Assessment:    32 y.o., starting Depo-Provera injections, no contraindications.   Plan:    All questions answered. Discussed healthy lifestyle modifications. Follow up in 3 months. Pregnancy test, result: negative.    Meds ordered this encounter  Medications  . medroxyPROGESTERone (DEPO-PROVERA) injection 150 mg  . medroxyPROGESTERone (DEPO-PROVERA) 150 MG/ML injection    Sig: Inject 1 mL (150 mg total) into the muscle every 3 (  three) months.    Dispense:  1 mL    Refill:  4   Orders Placed This Encounter  Procedures  . POCT urine pregnancy     Brock BadHARLES A. Peace Noyes MD 03-03-2018

## 2018-04-02 ENCOUNTER — Encounter (HOSPITAL_COMMUNITY): Payer: Self-pay | Admitting: Emergency Medicine

## 2018-04-02 ENCOUNTER — Ambulatory Visit (HOSPITAL_COMMUNITY)
Admission: EM | Admit: 2018-04-02 | Discharge: 2018-04-02 | Disposition: A | Discharge status: Home / Self Care | Payer: Medicaid Other | Attending: Family Medicine | Admitting: Family Medicine

## 2018-04-02 DIAGNOSIS — L509 Urticaria, unspecified: Secondary | ICD-10-CM | POA: Diagnosis not present

## 2018-04-02 MED ORDER — PREDNISONE 10 MG (21) PO TBPK: ORAL_TABLET | Freq: Every day | ORAL | 0 refills | Status: DC

## 2018-04-02 NOTE — ED Provider Notes (Signed)
[facility]   [id number] 04/02/18 Arrival Time: 1356  ASSESSMENT & PLAN:  1. Hives     Meds ordered this encounter  Medications  . predniSONE (STERAPRED UNI-PAK 21 TAB) 10 MG (21) TBPK tablet    Sig: Take by mouth daily. Take as directed.    Dispense:  21 tablet    Refill:  0    Benadryl if needed. Will follow up with PCP or here if worsening or failing to improve as anticipated. Reviewed expectations re: course of current medical issues. Questions answered. Outlined signs and symptoms indicating need for more acute intervention. Patient verbalized understanding. After Visit Summary given.   SUBJECTIVE:  Lorraine Nguyen is a 32 y.o. female who presents with a skin complaint. Abrupt onset this am. Rash and itching all over body. No h/o similar. Afebrile. No respiratory or swallowing difficulties. No new medications or exposures. No OTC/home treatment. No specific aggravating or alleviating factors reported.   ROS: As per HPI.  OBJECTIVE: Vitals:   04/02/18 1455  BP: 126/84  Pulse: 86  Resp: 16  Temp: 99 F (37.2 C)  TempSrc: Oral  SpO2: 100%  Weight: 244 lb (110.7 kg)    General appearance: alert; no distress Lungs: clear to auscultation bilaterally Heart: regular rate and rhythm Extremities: no edema Skin: warm and dry; smooth, crops slightly elevated and erythematous plaques of variable size over her torso, neck, and extremities Psychological: alert and cooperative; normal mood and affect  Allergies  Allergen Reactions  . Vicodin [Hydrocodone-Acetaminophen] Itching  . Ibuprofen Itching    Past Medical History:  Diagnosis Date  . Gestational diabetes    glyburide  . Medical history non-contributory    Social History   Socioeconomic History  . Marital status: Single    Spouse name: Not on file  . Number of children: Not on file  . Years of education: Not on file  . Highest education level: Not on file  Occupational History  . Not  on file  Social Needs  . Financial resource strain: Not on file  . Food insecurity:    Worry: Not on file    Inability: Not on file  . Transportation needs:    Medical: Not on file    Non-medical: Not on file  Tobacco Use  . Smoking status: Current Every Day Smoker    Packs/day: 0.25    Types: Cigarettes  . Smokeless tobacco: Current User  Substance and Sexual Activity  . Alcohol use: No    Comment: socially   . Drug use: No  . Sexual activity: Yes  Lifestyle  . Physical activity:    Days per week: Not on file    Minutes per session: Not on file  . Stress: Not on file  Relationships  . Social connections:    Talks on phone: Not on file    Gets together: Not on file    Attends religious service: Not on file    Active member of club or organization: Not on file    Attends meetings of clubs or organizations: Not on file    Relationship status: Not on file  . Intimate partner violence:    Fear of current or ex partner: Not on file    Emotionally abused: Not on file    Physically abused: Not on file    Forced sexual activity: Not on file  Other Topics Concern  . Not on file  Social History Narrative  . Not on file   Family  History  Problem Relation Age of Onset  . Diabetes Maternal Grandmother   . Hypertension Maternal Grandmother   . Diabetes Paternal Grandmother   . Hypertension Paternal Grandfather    Past Surgical History:  Procedure Laterality Date  . NO PAST SURGERIES       Mardella Layman, MD 04/02/18 1524

## 2018-04-02 NOTE — ED Triage Notes (Signed)
PT woke up at 4am this AM covered in hives. No new exposures

## 2018-05-21 ENCOUNTER — Ambulatory Visit: Payer: Medicaid Other | Admitting: Obstetrics

## 2018-06-01 ENCOUNTER — Ambulatory Visit: Payer: Medicaid Other | Admitting: Obstetrics

## 2018-06-01 ENCOUNTER — Encounter: Payer: Self-pay | Admitting: Obstetrics

## 2018-06-01 ENCOUNTER — Other Ambulatory Visit (HOSPITAL_COMMUNITY)
Admission: RE | Admit: 2018-06-01 | Discharge: 2018-06-01 | Disposition: A | Discharge status: Home / Self Care | Payer: Medicaid Other | Source: Ambulatory Visit | Attending: Obstetrics | Admitting: Obstetrics

## 2018-06-01 VITALS — BP 127/83 | HR 86 | Ht 74.0 in | Wt 241.0 lb

## 2018-06-01 DIAGNOSIS — F17210 Nicotine dependence, cigarettes, uncomplicated: Secondary | ICD-10-CM

## 2018-06-01 DIAGNOSIS — Z3042 Encounter for surveillance of injectable contraceptive: Secondary | ICD-10-CM | POA: Diagnosis not present

## 2018-06-01 DIAGNOSIS — Z113 Encounter for screening for infections with a predominantly sexual mode of transmission: Secondary | ICD-10-CM

## 2018-06-01 DIAGNOSIS — Z124 Encounter for screening for malignant neoplasm of cervix: Secondary | ICD-10-CM

## 2018-06-01 DIAGNOSIS — N898 Other specified noninflammatory disorders of vagina: Secondary | ICD-10-CM

## 2018-06-01 DIAGNOSIS — Z0000 Encounter for general adult medical examination without abnormal findings: Secondary | ICD-10-CM

## 2018-06-01 DIAGNOSIS — Z01419 Encounter for gynecological examination (general) (routine) without abnormal findings: Secondary | ICD-10-CM | POA: Diagnosis not present

## 2018-06-01 MED ORDER — METRONIDAZOLE 500 MG PO TABS: 500.0000 mg | ORAL_TABLET | Freq: Two times a day (BID) | ORAL | 2 refills | Status: DC

## 2018-06-01 MED ORDER — MEDROXYPROGESTERONE ACETATE 150 MG/ML IM SUSP
150.0000 mg | Freq: Once | INTRAMUSCULAR | Status: AC
  Administered 2018-06-01: 150 mg via INTRAMUSCULAR

## 2018-06-01 MED ORDER — MEDROXYPROGESTERONE ACETATE 150 MG/ML IM SUSP: 150.0000 mg | INTRAMUSCULAR | 4 refills | Status: DC

## 2018-06-01 NOTE — Progress Notes (Signed)
Patient presents for her Annual Exam today.  LMP:No cycles w/ Depo  Contraception : Depo Last Injection: 03/03/2018 given today  Last pap:05/27/2016 WNL  STD Screening:GC/CT

## 2018-06-01 NOTE — Progress Notes (Signed)
Subjective:        Lorraine Nguyen is a 32 y.o. female here for a routine exam.  Current complaints: Vaginal discharge.    Personal health questionnaire:  Is patient Ashkenazi Jewish, have a family history of breast and/or ovarian cancer: no Is there a family history of uterine cancer diagnosed at age < 71, gastrointestinal cancer, urinary tract cancer, family member who is a Personnel officer syndrome-associated carrier: no Is the patient overweight and hypertensive, family history of diabetes, personal history of gestational diabetes, preeclampsia or PCOS: no Is patient over 49, have PCOS,  family history of premature CHD under age 70, diabetes, smoke, have hypertension or peripheral artery disease:  no At any time, has a partner hit, kicked or otherwise hurt or frightened you?: no Over the past 2 weeks, have you felt down, depressed or hopeless?: no Over the past 2 weeks, have you felt little interest or pleasure in doing things?:no   Gynecologic History No LMP recorded. Contraception: Depo-Provera injections Last Pap: 2017. Results were: normal Last mammogram: n/a. Results were: n/a  Obstetric History OB History  Gravida Para Term Preterm AB Living  3 3 3     3   SAB TAB Ectopic Multiple Live Births        0 3    # Outcome Date GA Lbr Len/2nd Weight Sex Delivery Anes PTL Lv  3 Term 12/11/16 [redacted]w[redacted]d 05:02 / 00:01 7 lb 4 oz (3.289 kg) M Vag-Spont None  LIV  2 Term 01/19/08 [redacted]w[redacted]d  7 lb 6 oz (3.345 kg) M Vag-Spont   LIV  1 Term 06/05/06 [redacted]w[redacted]d  6 lb 7 oz (2.92 kg) F Vag-Spont   LIV     Complications: Hypertension affecting pregnancy    Past Medical History:  Diagnosis Date  . Gestational diabetes    glyburide  . Medical history non-contributory     Past Surgical History:  Procedure Laterality Date  . NO PAST SURGERIES       Current Outpatient Medications:  .  medroxyPROGESTERone (DEPO-PROVERA) 150 MG/ML injection, Inject 1 mL (150 mg total) into the muscle every 3 (three)  months., Disp: 1 mL, Rfl: 4 .  clindamycin (CLEOCIN) 300 MG capsule, Take 1 capsule (300 mg total) by mouth every 6 (six) hours. (Patient not taking: Reported on 06/01/2018), Disp: 28 capsule, Rfl: 0 .  medroxyPROGESTERone (DEPO-PROVERA) 150 MG/ML injection, Inject 1 mL (150 mg total) into the muscle every 3 (three) months., Disp: 1 mL, Rfl: 4 .  metroNIDAZOLE (FLAGYL) 500 MG tablet, Take 1 tablet (500 mg total) by mouth 2 (two) times daily., Disp: 14 tablet, Rfl: 2 .  predniSONE (STERAPRED UNI-PAK 21 TAB) 10 MG (21) TBPK tablet, Take by mouth daily. Take as directed. (Patient not taking: Reported on 06/01/2018), Disp: 21 tablet, Rfl: 0 .  traMADol (ULTRAM) 50 MG tablet, Take 1 tablet (50 mg total) by mouth every 6 (six) hours as needed. (Patient not taking: Reported on 06/01/2018), Disp: 15 tablet, Rfl: 0 Allergies  Allergen Reactions  . Vicodin [Hydrocodone-Acetaminophen] Itching  . Ibuprofen Itching    Social History   Tobacco Use  . Smoking status: Current Every Day Smoker    Packs/day: 0.25    Types: Cigarettes  . Smokeless tobacco: Current User  Substance Use Topics  . Alcohol use: No    Comment: socially     Family History  Problem Relation Age of Onset  . Diabetes Maternal Grandmother   . Hypertension Maternal Grandmother   . Diabetes Paternal  Grandmother   . Hypertension Paternal Grandfather       Review of Systems  Constitutional: negative for fatigue and weight loss Respiratory: negative for cough and wheezing Cardiovascular: negative for chest pain, fatigue and palpitations Gastrointestinal: negative for abdominal pain and change in bowel habits Musculoskeletal:negative for myalgias Neurological: negative for gait problems and tremors Behavioral/Psych: negative for abusive relationship, depression Endocrine: negative for temperature intolerance    Genitourinary:negative for abnormal menstrual periods, genital lesions, hot flashes, sexual problems and vaginal  discharge Integument/breast: negative for breast lump, breast tenderness, nipple discharge and skin lesion(s)    Objective:       BP 127/83   Pulse 86   Ht 6\' 2"  (1.88 m)   Wt 241 lb (109.3 kg)   BMI 30.94 kg/m  General:   alert  Skin:   no rash or abnormalities  Lungs:   clear to auscultation bilaterally  Heart:   regular rate and rhythm, S1, S2 normal, no murmur, click, rub or gallop  Breasts:   normal without suspicious masses, skin or nipple changes or axillary nodes  Abdomen:  normal findings: no organomegaly, soft, non-tender and no hernia  Pelvis:  External genitalia: normal general appearance Urinary system: urethral meatus normal and bladder without fullness, nontender Vaginal: normal without tenderness, induration or masses Cervix: normal appearance Adnexa: normal bimanual exam Uterus: anteverted and non-tender, normal size   Lab Review Urine pregnancy test Labs reviewed yes Radiologic studies reviewed no  50% of 20 min visit spent on counseling and coordination of care.   Assessment:     1. Women's annual routine gynecological examination - DOING WELL  2. Screening for cervical cancer Rx: - Cytology - PAP  3. Vaginal discharge Rx: - metroNIDAZOLE (FLAGYL) 500 MG tablet; Take 1 tablet (500 mg total) by mouth 2 (two) times daily.  Dispense: 14 tablet; Refill: 2  4. Screening examination for STD (sexually transmitted disease) Rx: - Cervicovaginal ancillary only  5. Encounter for management and injection of depo-Provera Rx: - medroxyPROGESTERone (DEPO-PROVERA) injection 150 mg - medroxyPROGESTERone (DEPO-PROVERA) 150 MG/ML injection; Inject 1 mL (150 mg total) into the muscle every 3 (three) months.  Dispense: 1 mL; Refill: 4  6. Tobacco dependence due to cigarettes - tobacco cessation recommended   Plan:    Education reviewed: calcium supplements, depression evaluation, low fat, low cholesterol diet, safe sex/STD prevention, self breast exams,  smoking cessation and weight bearing exercise. Contraception: Depo-Provera injections. Follow up in: 1 year.   Meds ordered this encounter  Medications  . medroxyPROGESTERone (DEPO-PROVERA) injection 150 mg  . metroNIDAZOLE (FLAGYL) 500 MG tablet    Sig: Take 1 tablet (500 mg total) by mouth 2 (two) times daily.    Dispense:  14 tablet    Refill:  2  . medroxyPROGESTERone (DEPO-PROVERA) 150 MG/ML injection    Sig: Inject 1 mL (150 mg total) into the muscle every 3 (three) months.    Dispense:  1 mL    Refill:  4   No orders of the defined types were placed in this encounter.   Brock BadHARLES A. Molly Maselli MD 06-01-2018

## 2018-06-02 LAB — CERVICOVAGINAL ANCILLARY ONLY
BACTERIAL VAGINITIS: POSITIVE — AB
CHLAMYDIA, DNA PROBE: NEGATIVE
Candida vaginitis: NEGATIVE
NEISSERIA GONORRHEA: NEGATIVE
Trichomonas: POSITIVE — AB

## 2018-06-03 ENCOUNTER — Other Ambulatory Visit: Payer: Self-pay | Admitting: Obstetrics

## 2018-06-03 DIAGNOSIS — B9689 Other specified bacterial agents as the cause of diseases classified elsewhere: Secondary | ICD-10-CM

## 2018-06-03 DIAGNOSIS — N760 Acute vaginitis: Secondary | ICD-10-CM

## 2018-06-03 DIAGNOSIS — A5901 Trichomonal vulvovaginitis: Secondary | ICD-10-CM

## 2018-06-03 MED ORDER — TINIDAZOLE 500 MG PO TABS: 2.0000 g | ORAL_TABLET | Freq: Every day | ORAL | 0 refills | Status: DC

## 2018-06-05 LAB — CYTOLOGY - PAP
DIAGNOSIS: UNDETERMINED — AB
HPV 16/18/45 GENOTYPING: NEGATIVE
HPV: DETECTED — AB

## 2018-07-06 ENCOUNTER — Other Ambulatory Visit: Payer: Self-pay

## 2018-07-06 DIAGNOSIS — B9689 Other specified bacterial agents as the cause of diseases classified elsewhere: Secondary | ICD-10-CM

## 2018-07-06 DIAGNOSIS — N760 Acute vaginitis: Secondary | ICD-10-CM

## 2018-07-06 DIAGNOSIS — A5901 Trichomonal vulvovaginitis: Secondary | ICD-10-CM

## 2018-08-25 ENCOUNTER — Ambulatory Visit: Payer: Medicaid Other

## 2018-08-28 ENCOUNTER — Ambulatory Visit: Payer: Medicaid Other

## 2018-08-31 ENCOUNTER — Ambulatory Visit (INDEPENDENT_AMBULATORY_CARE_PROVIDER_SITE_OTHER): Payer: Medicaid Other

## 2018-08-31 VITALS — BP 147/89 | HR 94 | Wt 246.8 lb

## 2018-08-31 DIAGNOSIS — Z3042 Encounter for surveillance of injectable contraceptive: Secondary | ICD-10-CM | POA: Diagnosis not present

## 2018-08-31 MED ORDER — MEDROXYPROGESTERONE ACETATE 150 MG/ML IM SUSP
150.0000 mg | Freq: Once | INTRAMUSCULAR | Status: AC
  Administered 2018-08-31: 150 mg via INTRAMUSCULAR

## 2018-08-31 NOTE — Progress Notes (Signed)
Presents for DEPO, given in LUOQ, tolerated well.  Next DEPO 03/10-24/2020  Administrations This Visit    medroxyPROGESTERone (DEPO-PROVERA) injection 150 mg    Admin Date 08/31/2018 Action Given Dose 150 mg Route Intramuscular Administered By Maretta BeesMcGlashan, Genetta Fiero J, RMA

## 2019-04-06 ENCOUNTER — Ambulatory Visit: Payer: Medicaid Other

## 2019-04-08 ENCOUNTER — Ambulatory Visit: Payer: Medicaid Other

## 2019-04-08 ENCOUNTER — Ambulatory Visit (INDEPENDENT_AMBULATORY_CARE_PROVIDER_SITE_OTHER): Payer: Medicaid Other

## 2019-04-08 ENCOUNTER — Other Ambulatory Visit: Payer: Self-pay

## 2019-04-08 DIAGNOSIS — Z3202 Encounter for pregnancy test, result negative: Secondary | ICD-10-CM

## 2019-04-08 DIAGNOSIS — Z3042 Encounter for surveillance of injectable contraceptive: Secondary | ICD-10-CM

## 2019-04-08 LAB — POCT URINE PREGNANCY: Preg Test, Ur: NEGATIVE

## 2019-04-08 NOTE — Progress Notes (Signed)
Pt is in the office for 1st UPT for depo restart. UPT results today are negative, pt to return in 2 weeks for 2nd UPT and depo, advised to bring rx to appt.

## 2019-04-22 ENCOUNTER — Other Ambulatory Visit: Payer: Self-pay

## 2019-04-22 ENCOUNTER — Other Ambulatory Visit: Payer: Self-pay | Admitting: Obstetrics

## 2019-04-22 ENCOUNTER — Ambulatory Visit (INDEPENDENT_AMBULATORY_CARE_PROVIDER_SITE_OTHER): Payer: Medicaid Other

## 2019-04-22 DIAGNOSIS — Z3042 Encounter for surveillance of injectable contraceptive: Secondary | ICD-10-CM

## 2019-04-22 DIAGNOSIS — Z3200 Encounter for pregnancy test, result unknown: Secondary | ICD-10-CM

## 2019-04-22 DIAGNOSIS — Z3202 Encounter for pregnancy test, result negative: Secondary | ICD-10-CM | POA: Diagnosis not present

## 2019-04-22 LAB — POCT URINE PREGNANCY: Preg Test, Ur: NEGATIVE

## 2019-04-22 MED ORDER — MEDROXYPROGESTERONE ACETATE 150 MG/ML IM SUSP
150.0000 mg | INTRAMUSCULAR | Status: DC
  Administered 2019-04-22 – 2019-10-15 (×2): 150 mg via INTRAMUSCULAR

## 2019-04-22 NOTE — Progress Notes (Signed)
Pt is in the office for 2nd UPT for depo restart. UPT is negative. Administered depo injection and pt tolerated well. Next due 10/29-11/12 .Marland Kitchen Administrations This Visit    medroxyPROGESTERone (DEPO-PROVERA) injection 150 mg    Admin Date 04/22/2019 Action Given Dose 150 mg Route Intramuscular Administered By Hinton Lovely, RN

## 2019-07-06 ENCOUNTER — Ambulatory Visit (INDEPENDENT_AMBULATORY_CARE_PROVIDER_SITE_OTHER)
Admission: RE | Admit: 2019-07-06 | Discharge: 2019-07-06 | Disposition: A | Discharge status: Home / Self Care | Payer: Medicaid Other | Source: Ambulatory Visit

## 2019-07-06 DIAGNOSIS — K047 Periapical abscess without sinus: Secondary | ICD-10-CM | POA: Diagnosis not present

## 2019-07-06 MED ORDER — AMOXICILLIN-POT CLAVULANATE 875-125 MG PO TABS: 1.0000 | ORAL_TABLET | Freq: Two times a day (BID) | ORAL | 0 refills | Status: AC

## 2019-07-06 NOTE — ED Provider Notes (Signed)
Virtual Visit via Video Note:  Lorraine Nguyen  initiated request for Telemedicine visit with [facility] team. I connected with Lorraine Nguyen  on 07/06/2019 at 4:51 PM  for a synchronized telemedicine visit using a video enabled HIPPA compliant telemedicine application. I verified that I am speaking with Lorraine Nguyen  using two identifiers. Hallie C Wieters, PA-C  was physically located in a [facility] Urgent care site and MALEIYA PERGOLA was located at a different location.   The limitations of evaluation and management by telemedicine as well as the availability of in-person appointments were discussed. Patient was informed that she  may incur a bill ( including co-pay) for this virtual visit encounter. Lorraine Nguyen  expressed understanding and gave verbal consent to proceed with virtual visit.     History of Present Illness:Lorraine Nguyen  is a 33 y.o. female presents for evaluation of dental abscess.  Patient states that over the past 24 to 48 hours she has developed pain and swelling to her right lower jaw.  She is concerned about an abscess.  Notes that a few days ago she chipped her tooth and more recently has developed signs of an abscess.  She denies any fevers.  Denies difficulty swallowing.  Denies neck pain or neck stiffness.  States that her pain has been relatively mild, but she is concerned about the swelling.  She has been unable to follow-up with dentistry due to current Covid pandemic.   Allergies  Allergen Reactions  . Vicodin [Hydrocodone-Acetaminophen] Itching  . Ibuprofen Itching     Past Medical History:  Diagnosis Date  . Gestational diabetes    glyburide  . Medical history non-contributory      Social History   Tobacco Use  . Smoking status: Current Every Day Smoker    Packs/day: 0.25    Types: Cigarettes  . Smokeless tobacco: Current User  Substance Use Topics  . Alcohol use: No    Comment: socially   . Drug  use: No        Observations/Objective: Physical Exam  Constitutional: She is oriented to person, place, and time and well-developed, well-nourished, and in no distress. No distress.  HENT:  Head: Normocephalic and atraumatic.  Mild swelling noted to left lower jaw, no overlying discoloration  Neck:  Full active range of motion of neck, no notable swelling or erythema  Pulmonary/Chest: Effort normal. No respiratory distress.  Speaking in full sentences  Neurological: She is alert and oriented to person, place, and time.  Speech clear     Assessment and Plan:    ICD-10-CM   1. Dental abscess  K04.7     Patient likely with dental abscess versus infection.  Will place on Augmentin to cover for infection.  Will have continue with Tylenol for pain.  Follow-up with dentistry for more permanent treatment and prevent recurrence of abscesses.  Provided dental resources.  Discussed strict return precautions. Patient verbalized understanding and is agreeable with plan.    Follow Up Instructions:     I discussed the assessment and treatment plan with the patient. The patient was provided an opportunity to ask questions and all were answered. The patient agreed with the plan and demonstrated an understanding of the instructions.   The patient was advised to call back or seek an in-person evaluation if the symptoms worsen or if the condition fails to improve as anticipated.      Lew Dawes, PA-C  07/06/2019 4:51  PM        Joneen Caraway [place] C, PA-C 07/06/19 1652

## 2019-07-06 NOTE — Discharge Instructions (Signed)
Please use dental resource to contact offices to seek permenant treatment/relief.   Today we have given you an antibiotic. This should help with pain as any infection is cleared. Augmentin twice daily.   For pain please take 1000 mg of Tylenol Extra strength every 4-6 hours. These are safe to take together. Please take with food.   Please return if you start to experience significant swelling of your face, experiencing fever.

## 2019-07-14 ENCOUNTER — Ambulatory Visit: Payer: Medicaid Other

## 2019-07-22 ENCOUNTER — Other Ambulatory Visit: Payer: Self-pay | Admitting: Obstetrics

## 2019-07-23 ENCOUNTER — Other Ambulatory Visit: Payer: Self-pay

## 2019-07-23 ENCOUNTER — Ambulatory Visit (INDEPENDENT_AMBULATORY_CARE_PROVIDER_SITE_OTHER): Payer: Medicaid Other

## 2019-07-23 DIAGNOSIS — Z3042 Encounter for surveillance of injectable contraceptive: Secondary | ICD-10-CM

## 2019-07-23 MED ORDER — MEDROXYPROGESTERONE ACETATE 150 MG/ML IM SUSP
150.0000 mg | Freq: Once | INTRAMUSCULAR | Status: AC
  Administered 2019-07-23: 10:00:00 150 mg via INTRAMUSCULAR

## 2019-07-23 NOTE — Progress Notes (Signed)
Pt is here for a depo injection. Pt tolerated injection well in the RUOQ without complications.  Pt advised to make next appointment in 90 days. Pt verbalized understanding. -EH/RMA

## 2019-08-17 NOTE — Progress Notes (Signed)
Patient ID: Lorraine Nguyen, female   DOB: [birth year], 33 y.o.   MRN: [id number] Patient seen and assessed by nursing staff during this encounter. I have reviewed the chart and agree with the documentation and plan.  Emeterio Reeve, MD 08/17/2019 3:48 PM

## 2019-08-27 ENCOUNTER — Telehealth: Payer: Medicaid Other

## 2019-09-27 ENCOUNTER — Telehealth: Payer: Medicaid Other

## 2019-09-30 ENCOUNTER — Ambulatory Visit (HOSPITAL_COMMUNITY)
Admission: EM | Admit: 2019-09-30 | Discharge: 2019-09-30 | Disposition: A | Discharge status: Home / Self Care | Payer: Medicaid Other | Attending: Physician Assistant | Admitting: Physician Assistant

## 2019-09-30 ENCOUNTER — Other Ambulatory Visit: Payer: Self-pay

## 2019-09-30 ENCOUNTER — Encounter (HOSPITAL_COMMUNITY): Payer: Self-pay

## 2019-09-30 ENCOUNTER — Telehealth: Payer: Medicaid Other

## 2019-09-30 DIAGNOSIS — K047 Periapical abscess without sinus: Secondary | ICD-10-CM | POA: Diagnosis not present

## 2019-09-30 MED ORDER — HYDROCODONE-ACETAMINOPHEN 5-325 MG PO TABS: 1.0000 | ORAL_TABLET | ORAL | 0 refills | Status: AC | PRN

## 2019-09-30 MED ORDER — AMOXICILLIN-POT CLAVULANATE 875-125 MG PO TABS: 1.0000 | ORAL_TABLET | Freq: Two times a day (BID) | ORAL | 0 refills | Status: AC

## 2019-09-30 NOTE — Discharge Instructions (Signed)
Call the dental clinics attached, to be seen within 1 week if possible  Take the augementin 2 times a day for 10 days, or until seen by dentist and management changed  Take the norco at night for pain, take 1 benadryl to help with itching. Or you may take this when you are not at work.   During the day take 2 extra strength tylenol every 8 hours for pain.

## 2019-09-30 NOTE — ED Triage Notes (Signed)
Pt is here with dental pain that started 3 days ago & thinks it could be an abscess. Pt states she does NOT have a dentist.

## 2019-09-30 NOTE — ED Provider Notes (Signed)
[facility]    CSN: [id number] Arrival date & time: 09/30/19  1039      History   Chief Complaint Chief Complaint  Patient presents with  . Dental Pain    HPI Lorraine Nguyen is a 34 y.o. female.   Patient reports to urgent care today for 3 days of worsening dental pain. The pain is on her right lower teeth. She reports a history of "bad teeth" and was seen in 06/2019 for similar complaint. She was treated with augmentin and recommended to follow up with dentistry. Patient has been unable to follow up. She reports pain started again 3 days ago and has been worsening. She can not chew on that side. She denies fever, chills, discharge.  She has tried OTC pain medications without relief.      Past Medical History:  Diagnosis Date  . Gestational diabetes    glyburide  . Medical history non-contributory     Patient Active Problem List   Diagnosis Date Noted  . Vaginal delivery 12/11/2016  . Gestational diabetes 10/09/2016  . Elevated AFP 09/24/2016  . Group B streptococcal bacteriuria 05/31/2016  . Supervision of high risk pregnancy, antepartum 05/26/2016    Past Surgical History:  Procedure Laterality Date  . NO PAST SURGERIES      OB History    Gravida  3   Para  3   Term  3   Preterm      AB      Living  3     SAB      TAB      Ectopic      Multiple  0   Live Births  3            Home Medications    Prior to Admission medications   Medication Sig Start Date End Date Taking? Authorizing Provider  medroxyPROGESTERone Acetate 150 MG/ML SUSY INJECT 1 ML INTO MUSCLE EVERY 3 MONTHS 07/23/19   Brock Bad, MD    Family History Family History  Problem Relation Age of Onset  . Diabetes Maternal Grandmother   . Hypertension Maternal Grandmother   . Diabetes Paternal Grandmother   . Hypertension Paternal Grandfather   . Healthy Mother   . Healthy Father     Social History Social History   Tobacco Use  . Smoking  status: Current Every Day Smoker    Packs/day: 0.25    Types: Cigarettes  . Smokeless tobacco: Current User  Substance Use Topics  . Alcohol use: No    Comment: socially   . Drug use: No     Allergies   Vicodin [hydrocodone-acetaminophen] and Ibuprofen   Review of Systems Review of Systems  Constitutional: Negative for chills and fever.  HENT: Positive for dental problem and mouth sores. Negative for drooling and ear pain.   Respiratory: Negative for cough.   Cardiovascular: Negative for chest pain and palpitations.  Gastrointestinal: Negative for nausea.  Musculoskeletal: Negative for myalgias.  Neurological: Negative for headaches.     Physical Exam Triage Vital Signs ED Triage Vitals  Enc Vitals Group     BP 09/30/19 1135 137/90     Pulse Rate 09/30/19 1135 87     Resp 09/30/19 1135 18     Temp 09/30/19 1135 98.9 F (37.2 C)     Temp Source 09/30/19 1135 Oral     SpO2 09/30/19 1135 100 %     Weight 09/30/19 1133 250 lb (113.4 kg)  Height --      Head Circumference --      Peak Flow --      Pain Score 09/30/19 1133 7     Pain Loc --      Pain Edu? --      Excl. in [place]? --    No data found.  Updated Vital Signs BP 137/90 (BP Location: Right Arm)   Pulse 87   Temp 98.9 F (37.2 C) (Oral)   Resp 18   Wt 250 lb (113.4 kg)   LMP 09/30/2019   SpO2 100%   BMI 32.10 kg/m   Visual Acuity Right Eye Distance:   Left Eye Distance:   Bilateral Distance:    Right Eye Near:   Left Eye Near:    Bilateral Near:     Physical Exam Vitals and nursing note reviewed.  Constitutional:      General: She is not in acute distress.    Appearance: She is well-developed. She is obese. She is not ill-appearing.  HENT:     Head: Normocephalic and atraumatic.     Mouth/Throat:     Mouth: Mucous membranes are moist. Oral lesions present.     Dentition: Abnormal dentition. Dental tenderness and dental caries present.     Tongue: No lesions.     Pharynx: Oropharynx  is clear. No posterior oropharyngeal erythema.      Comments: Poor dentition throughout, with evidence of severe odontal and periodontal disease.  No clear sign of abscess Eyes:     General: No scleral icterus.    Conjunctiva/sclera: Conjunctivae normal.     Pupils: Pupils are equal, round, and reactive to light.  Cardiovascular:     Rate and Rhythm: Normal rate and regular rhythm.     Heart sounds: No murmur.  Pulmonary:     Effort: Pulmonary effort is normal. No respiratory distress.     Breath sounds: Normal breath sounds.  Abdominal:     Palpations: Abdomen is soft.     Tenderness: There is no abdominal tenderness.  Musculoskeletal:     Cervical back: Neck supple.  Skin:    General: Skin is warm and dry.  Neurological:     Mental Status: She is alert.      UC Treatments / Results  Labs (all labs ordered are listed, but only abnormal results are displayed) Labs Reviewed - No data to display  EKG   Radiology No results found.  Procedures Procedures (including critical care time)  Medications Ordered in UC Medications - No data to display  Initial Impression / Assessment and Plan / UC Course  I have reviewed the triage vital signs and the nursing notes.  Pertinent labs & imaging results that were available during my care of the patient were reviewed by me and considered in my medical decision making (see chart for details).    #Dental infection - very poor dentition. Discussed at length the need to follow up with dentist. Augmentin and sent norco x 10 for pain relief at night. Patient agrees she will call low cost dental options today. Return and ED precautions discussed.    Final Clinical Impressions(s) / UC Diagnoses   Final diagnoses:  None   Discharge Instructions   None    ED Prescriptions    None     PDMP not reviewed this encounter.   Purnell Shoemaker, PA-C 09/30/19 1409

## 2019-10-15 ENCOUNTER — Other Ambulatory Visit: Payer: Self-pay

## 2019-10-15 ENCOUNTER — Ambulatory Visit (INDEPENDENT_AMBULATORY_CARE_PROVIDER_SITE_OTHER): Payer: Medicaid Other

## 2019-10-15 DIAGNOSIS — Z3042 Encounter for surveillance of injectable contraceptive: Secondary | ICD-10-CM | POA: Diagnosis not present

## 2019-10-15 NOTE — Progress Notes (Signed)
Pt is in the office for injection, administered in LUOQ and pt tolerated well. .. Administrations This Visit    medroxyPROGESTERone (DEPO-PROVERA) injection 150 mg    Admin Date 10/15/2019 Action Given Dose 150 mg Route Intramuscular Administered By Katrina Stack, RN

## 2019-10-15 NOTE — Progress Notes (Signed)
Patient seen and assessed by nursing staff during this encounter. I have reviewed the chart and agree with the documentation and plan.  Vonzella Nipple, PA-C 10/15/2019 11:50 AM

## 2019-11-07 ENCOUNTER — Encounter (HOSPITAL_COMMUNITY): Payer: Self-pay

## 2019-11-07 ENCOUNTER — Ambulatory Visit (HOSPITAL_COMMUNITY)
Admission: EM | Admit: 2019-11-07 | Discharge: 2019-11-07 | Disposition: A | Discharge status: Home / Self Care | Payer: Medicaid Other | Attending: Urgent Care | Admitting: Urgent Care

## 2019-11-07 ENCOUNTER — Other Ambulatory Visit: Payer: Self-pay

## 2019-11-07 DIAGNOSIS — K029 Dental caries, unspecified: Secondary | ICD-10-CM

## 2019-11-07 DIAGNOSIS — K047 Periapical abscess without sinus: Secondary | ICD-10-CM | POA: Diagnosis not present

## 2019-11-07 MED ORDER — PREDNISONE 20 MG PO TABS: 20.0000 mg | ORAL_TABLET | Freq: Every day | ORAL | 0 refills | Status: DC

## 2019-11-07 MED ORDER — AMOXICILLIN-POT CLAVULANATE 875-125 MG PO TABS: 1.0000 | ORAL_TABLET | Freq: Two times a day (BID) | ORAL | 0 refills | Status: DC

## 2019-11-07 NOTE — ED Provider Notes (Signed)
  [facility]   MRN: [id number] DOB: 1986-06-12  Subjective:   Lorraine Nguyen is a 34 y.o. female presenting for 2 day history of acute onset recurrent worsening severe right lower dental pain. Patient saw her dentist, was prescribed a "pink" antibiotic that is not helping. ~1 month ago, patient underwent course of Augmentin which helped better. She is requesting this again.    Current Facility-Administered Medications:  .  medroxyPROGESTERone (DEPO-PROVERA) injection 150 mg, 150 mg, Intramuscular, Q90 days, Adam Phenix, MD, 150 mg at 10/15/19 1143  Current Outpatient Medications:  .  medroxyPROGESTERone Acetate 150 MG/ML SUSY, INJECT 1 ML INTO MUSCLE EVERY 3 MONTHS, Disp: 1 mL, Rfl: 3   Allergies  Allergen Reactions  . Vicodin [Hydrocodone-Acetaminophen] Itching  . Ibuprofen Itching    Past Medical History:  Diagnosis Date  . Gestational diabetes    glyburide  . Medical history non-contributory      Past Surgical History:  Procedure Laterality Date  . NO PAST SURGERIES      Family History  Problem Relation Age of Onset  . Diabetes Maternal Grandmother   . Hypertension Maternal Grandmother   . Diabetes Paternal Grandmother   . Hypertension Paternal Grandfather   . Healthy Mother   . Healthy Father     Social History   Tobacco Use  . Smoking status: Current Every Day Smoker    Packs/day: 0.25    Types: Cigarettes  . Smokeless tobacco: Current User  Substance Use Topics  . Alcohol use: No    Comment: socially   . Drug use: No    ROS   Objective:   Vitals: BP 140/90 (BP Location: Left Arm)   Pulse (!) 104   Temp 99.3 F (37.4 C) (Oral)   Resp 18   SpO2 100%    Physical Exam Constitutional:      General: She is not in acute distress.    Appearance: Normal appearance. She is well-developed. She is not ill-appearing, toxic-appearing or diaphoretic.  HENT:     Head: Normocephalic and atraumatic.      Nose: Nose normal.   Mouth/Throat:     Mouth: Mucous membranes are moist.     Pharynx: Oropharynx is clear.   Eyes:     General: No scleral icterus.    Extraocular Movements: Extraocular movements intact.     Pupils: Pupils are equal, round, and reactive to light.  Cardiovascular:     Rate and Rhythm: Normal rate.  Pulmonary:     Effort: Pulmonary effort is normal.  Skin:    General: Skin is warm and dry.  Neurological:     General: No focal deficit present.     Mental Status: She is alert and oriented to person, place, and time.  Psychiatric:        Mood and Affect: Mood normal.        Behavior: Behavior normal.        Thought Content: Thought content normal.        Judgment: Judgment normal.      Assessment and Plan :   1. Dental abscess   2. Dental infection   3. Pain due to dental caries     Restart Augmentin, prednisone for inflammation.  Emphasized need to contact her dentist first thing tomorrow.  Strict ER precautions. Counseled patient on potential for adverse effects with medications prescribed today, patient verbalized understanding.    Wallis Bamberg, [place] 11/07/19 1358

## 2019-11-07 NOTE — ED Triage Notes (Signed)
Pt present dental swelling and pain, symptoms started on Friday night. The prescription that she was prescribed is not helping patient.

## 2019-12-01 ENCOUNTER — Ambulatory Visit (INDEPENDENT_AMBULATORY_CARE_PROVIDER_SITE_OTHER)
Admission: RE | Admit: 2019-12-01 | Discharge: 2019-12-01 | Disposition: A | Discharge status: Home / Self Care | Payer: Medicaid Other | Source: Ambulatory Visit

## 2019-12-01 DIAGNOSIS — N898 Other specified noninflammatory disorders of vagina: Secondary | ICD-10-CM | POA: Diagnosis not present

## 2019-12-01 MED ORDER — METRONIDAZOLE 500 MG PO TABS: 500.0000 mg | ORAL_TABLET | Freq: Two times a day (BID) | ORAL | 0 refills | Status: DC

## 2019-12-01 MED ORDER — FLUCONAZOLE 200 MG PO TABS: ORAL_TABLET | ORAL | 0 refills | Status: DC

## 2019-12-01 NOTE — Discharge Instructions (Signed)
Based on symptoms will cover for BV and yeast Prescribed metronidazole 500 mg twice daily for 7 days (do not take while consuming alcohol and/or if breastfeeding) Prescribed diflucan 200 mg.  Take 1 pill half way through taking metronidazole, and take second does after finishing course of metronidazole.   Follow up in person or with PCP Follow up in person or go to ER if you have any new or worsening symptoms fever, chills, nausea, vomiting, abdominal or pelvic pain, painful intercourse, vaginal discharge, vaginal bleeding, persistent symptoms despite treatment, etc..Marland Kitchen

## 2019-12-01 NOTE — ED Provider Notes (Signed)
[place] Virtual Visit via Video Note:  BETTY DAIDONE  initiated request for Telemedicine visit with [facility] team. I connected with [name]  on 12/01/2019 at 4:37 PM  for a synchronized telemedicine visit using a video enabled HIPPA compliant telemedicine application. I verified that I am speaking with [name]  using two identifiers. Lestine Box, PA-C  was physically located in a [facility] Urgent care site and AMOURA RANSIER was located at a different location.   The limitations of evaluation and management by telemedicine as well as the availability of in-person appointments were discussed. Patient was informed that she  may incur a bill ( including co-pay) for this virtual visit encounter. Alen Bleacher Cissell  expressed understanding and gave verbal consent to proceed with virtual visit.  [id number] 12/01/19 Arrival Time: 20   CC: VAGINAL DISCHARGE  SUBJECTIVE:  Lorraine Nguyen is a 34 y.o. female who presents with complaints of vaginal discharge, vaginal itching, and vaginal odor x 1 week.  Recently treated with antibiotic, changed body wash, and last sex 3 days ago.  Patient has been sexually active with 1 female partner in the past 6 months.  Denies concern for STDs.  Partner without symptoms.  Describes discharge as normal for her and white.  She has tried OTC cream for yeast infection without relief.  Denies aggravating factors.  She reports similar symptoms in the past and was diagnosed with BV.  She denies fever, chills, nausea, vomiting, abdominal or pelvic pain, urinary symptoms, vaginal itching, vaginal bleeding, dyspareunia, vaginal rashes or lesions.   LMP: Irregular periods on depo.  Last depo shot 2 months  ROS: As per HPI.  All other pertinent ROS negative.     Past Medical History:  Diagnosis Date  . Gestational diabetes    glyburide  . Medical history non-contributory    Past Surgical History:    Procedure Laterality Date  . NO PAST SURGERIES     Allergies  Allergen Reactions  . Vicodin [Hydrocodone-Acetaminophen] Itching  . Ibuprofen Itching   Current Facility-Administered Medications on File Prior to Encounter  Medication Dose Route Frequency Provider Last Rate Last Admin  . medroxyPROGESTERone (DEPO-PROVERA) injection 150 mg  150 mg Intramuscular Q90 days Woodroe Mode, MD   150 mg at 10/15/19 1143   Current Outpatient Medications on File Prior to Encounter  Medication Sig Dispense Refill  . medroxyPROGESTERone Acetate 150 MG/ML SUSY INJECT 1 ML INTO MUSCLE EVERY 3 MONTHS 1 mL 3     OBJECTIVE:  There were no vitals filed for this visit.  General appearance: alert; no distress Eyes: EOMI grossly HENT: normocephalic; atraumatic Neck: supple with FROM Lungs: normal respiratory effort; speaking in full sentences without difficulty Extremities: moves extremities without difficulty Skin: No obvious rashes Neurologic: No facial asymmetries Psychological: alert and cooperative; normal mood and affect  ASSESSMENT & PLAN:  1. Vaginal discharge   2. Vaginal odor   3. Vaginal itching     Meds ordered this encounter  Medications  . metroNIDAZOLE (FLAGYL) 500 MG tablet    Sig: Take 1 tablet (500 mg total) by mouth 2 (two) times daily.    Dispense:  14 tablet    Refill:  0    Order Specific Question:   Supervising Provider    Answer:   Raylene Everts [[id number]]  . fluconazole (DIFLUCAN) 200 MG tablet    Sig: Take one dose by mouth, wait 72 hours, and then take  second dose by mouth    Dispense:  2 tablet    Refill:  0    Order Specific Question:   Supervising Provider    Answer:   Eustace Moore [[id number]]    Based on symptoms will cover for BV and yeast Prescribed metronidazole 500 mg twice daily for 7 days (do not take while consuming alcohol and/or if breastfeeding) Prescribed diflucan 200 mg.  Take 1 pill half way through taking metronidazole, and take  second does after finishing course of metronidazole.   Follow up in person or with PCP Follow up in person or go to ER if you have any new or worsening symptoms fever, chills, nausea, vomiting, abdominal or pelvic pain, painful intercourse, vaginal discharge, vaginal bleeding, persistent symptoms despite treatment, etc...  I discussed the assessment and treatment plan with the patient. The patient was provided an opportunity to ask questions and all were answered. The patient agreed with the plan and demonstrated an understanding of the instructions.   The patient was advised to call back or seek an in-person evaluation if the symptoms worsen or if the condition fails to improve as anticipated.  I provided 10 minutes of non-face-to-face time during this encounter.  Rennis Harding, PA-C  12/01/2019 4:37 PM       Rennis Harding, PA-C 12/01/19 1638

## 2020-01-01 ENCOUNTER — Encounter: Payer: Self-pay | Admitting: Emergency Medicine

## 2020-01-01 ENCOUNTER — Ambulatory Visit
Admission: EM | Admit: 2020-01-01 | Discharge: 2020-01-01 | Disposition: A | Discharge status: Home / Self Care | Payer: Medicaid Other | Attending: Emergency Medicine | Admitting: Emergency Medicine

## 2020-01-01 ENCOUNTER — Other Ambulatory Visit: Payer: Self-pay

## 2020-01-01 ENCOUNTER — Telehealth: Payer: Medicaid Other

## 2020-01-01 DIAGNOSIS — N3001 Acute cystitis with hematuria: Secondary | ICD-10-CM | POA: Diagnosis not present

## 2020-01-01 DIAGNOSIS — N898 Other specified noninflammatory disorders of vagina: Secondary | ICD-10-CM

## 2020-01-01 LAB — POCT URINALYSIS DIP (MANUAL ENTRY)
Bilirubin, UA: NEGATIVE
Glucose, UA: NEGATIVE mg/dL
Ketones, POC UA: NEGATIVE mg/dL
Nitrite, UA: NEGATIVE
Protein Ur, POC: 30 mg/dL — AB
Spec Grav, UA: >=1.03 — AB (ref 1.010–1.025)
Urobilinogen, UA: 1 E.U./dL
pH, UA: 6.5 (ref 5.0–8.0)

## 2020-01-01 LAB — POCT URINE PREGNANCY: Preg Test, Ur: NEGATIVE

## 2020-01-01 MED ORDER — CEPHALEXIN 500 MG PO CAPS: 500.0000 mg | ORAL_CAPSULE | Freq: Two times a day (BID) | ORAL | 0 refills | Status: AC

## 2020-01-01 NOTE — Discharge Instructions (Addendum)
Take antibiotic twice daily with food. Urine culture is pending: We will call you if we need to change antibiotics. STD test pending: Do not have intercourse until these results are back. We will treat you for any infections if positive. Return for worsening urinary symptoms, vaginal discharge, pelvic or abdominal pain, back pain, fever.

## 2020-01-01 NOTE — ED Triage Notes (Signed)
Patient has frequent urination and odor to urine.  This started 3-4 days ago.  Many years ago had uti.  Denies abdominal pain or back pain, reports vaginal discharge.  Vaginal discharge described as yellowish, now milky, discharge is intermittent

## 2020-01-01 NOTE — ED Provider Notes (Signed)
[facility]    CSN: [id number] Arrival date & time: 01/01/20  1150      History   Chief Complaint Chief Complaint  Patient presents with  . Urinary Tract Infection    HPI Lorraine Nguyen is a 34 y.o. female presenting for 3-4-day course of urinary frequency and malodor.  States last UTI was years ago, though felt similar.  Denies abdominal, back, fever.  No blood in urine.  Patient does have vaginal discharge that is yellow, milky, and intermittent.  Last sexually active 1.5 months ago.  Denying pelvic or vaginal pain.  Past Medical History:  Diagnosis Date  . Gestational diabetes    glyburide  . Medical history non-contributory     Patient Active Problem List   Diagnosis Date Noted  . Vaginal delivery 12/11/2016  . Gestational diabetes 10/09/2016  . Elevated AFP 09/24/2016  . Group B streptococcal bacteriuria 05/31/2016  . Supervision of high risk pregnancy, antepartum 05/26/2016    Past Surgical History:  Procedure Laterality Date  . NO PAST SURGERIES      OB History    Gravida  3   Para  3   Term  3   Preterm      AB      Living  3     SAB      TAB      Ectopic      Multiple  0   Live Births  3            Home Medications    Prior to Admission medications   Medication Sig Start Date End Date Taking? Authorizing Provider  cephALEXin (KEFLEX) 500 MG capsule Take 1 capsule (500 mg total) by mouth 2 (two) times daily for 5 days. 01/01/20 01/06/20  Hall-Potvin, [place], PA-C  medroxyPROGESTERone Acetate 150 MG/ML SUSY INJECT 1 ML INTO MUSCLE EVERY 3 MONTHS 07/23/19   Brock Bad, MD    Family History Family History  Problem Relation Age of Onset  . Diabetes Maternal Grandmother   . Hypertension Maternal Grandmother   . Diabetes Paternal Grandmother   . Hypertension Paternal Grandfather   . Healthy Mother   . Healthy Father     Social History Social History   Tobacco Use  . Smoking status: Current Every  Day Smoker    Packs/day: 0.25    Types: Cigarettes  . Smokeless tobacco: Current User  Substance Use Topics  . Alcohol use: No    Comment: socially   . Drug use: No     Allergies   Vicodin [hydrocodone-acetaminophen] and Ibuprofen   Review of Systems As per HPI   Physical Exam Triage Vital Signs ED Triage Vitals  Enc Vitals Group     BP      Pulse      Resp      Temp      Temp src      SpO2      Weight      Height      Head Circumference      Peak Flow      Pain Score      Pain Loc      Pain Edu?      Excl. in GC?    No data found.  Updated Vital Signs BP 131/84 (BP Location: Left Arm)   Pulse 92   Temp 99.3 F (37.4 C) (Oral)   Resp 18   SpO2 99%   Visual Acuity Right Eye  Distance:   Left Eye Distance:   Bilateral Distance:    Right Eye Near:   Left Eye Near:    Bilateral Near:     Physical Exam Constitutional:      General: She is not in acute distress. HENT:     Head: Normocephalic and atraumatic.  Eyes:     General: No scleral icterus.    Pupils: Pupils are equal, round, and reactive to light.  Cardiovascular:     Rate and Rhythm: Normal rate.  Pulmonary:     Effort: Pulmonary effort is normal.  Abdominal:     General: Bowel sounds are normal.     Palpations: Abdomen is soft.     Tenderness: There is no abdominal tenderness. There is no right CVA tenderness, left CVA tenderness or guarding.  Genitourinary:    Comments: Patient declined, self-swab performed Skin:    Coloration: Skin is not jaundiced or pale.  Neurological:     Mental Status: She is alert and oriented to person, place, and time.      UC Treatments / Results  Labs (all labs ordered are listed, but only abnormal results are displayed) Labs Reviewed  POCT URINALYSIS DIP (MANUAL ENTRY) - Abnormal; Notable for the following components:      Result Value   Clarity, UA cloudy (*)    Spec Grav, UA >=1.030 (*)    Blood, UA moderate (*)    Protein Ur, POC =30 (*)     Leukocytes, UA Small (1+) (*)    All other components within normal limits  POCT URINE PREGNANCY - Normal  URINE CULTURE    EKG   Radiology No results found.  Procedures Procedures (including critical care time)  Medications Ordered in UC Medications - No data to display  Initial Impression / Assessment and Plan / UC Course  I have reviewed the triage vital signs and the nursing notes.  Pertinent labs & imaging results that were available during my care of the patient were reviewed by me and considered in my medical decision making (see chart for details).     Patient appears well in office today.  Urine pregnancy negative, urine dipstick significant for small leukocytes, protein, moderate blood, elevated specific gravity.  We will start Keflex today for acute cystitis.  Cytology pending: We will treat if indicated.  Return precautions discussed, patient verbalized understanding and is agreeable to plan. Final Clinical Impressions(s) / UC Diagnoses   Final diagnoses:  Acute cystitis with hematuria  Vaginal discharge     Discharge Instructions     Take antibiotic twice daily with food. Urine culture is pending: We will call you if we need to change antibiotics. STD test pending: Do not have intercourse until these results are back. We will treat you for any infections if positive. Return for worsening urinary symptoms, vaginal discharge, pelvic or abdominal pain, back pain, fever.    ED Prescriptions    Medication Sig Dispense Auth. Provider   cephALEXin (KEFLEX) 500 MG capsule Take 1 capsule (500 mg total) by mouth 2 (two) times daily for 5 days. 10 capsule Hall-Potvin, [place], PA-C     PDMP not reviewed this encounter.   Hall-Potvin, [place], [place] 01/01/20 1229

## 2020-01-03 ENCOUNTER — Ambulatory Visit: Payer: Medicaid Other

## 2020-01-04 LAB — URINE CULTURE: Culture: <10000 — AB

## 2020-01-04 LAB — CERVICOVAGINAL ANCILLARY ONLY
Chlamydia: NEGATIVE
Comment: NEGATIVE
Comment: NEGATIVE
Comment: NORMAL
Neisseria Gonorrhea: NEGATIVE
Trichomonas: NEGATIVE

## 2020-01-06 ENCOUNTER — Ambulatory Visit: Payer: Medicaid Other

## 2020-01-14 ENCOUNTER — Ambulatory Visit: Payer: Medicaid Other

## 2020-01-18 ENCOUNTER — Other Ambulatory Visit: Payer: Self-pay

## 2020-01-18 MED ORDER — MEDROXYPROGESTERONE ACETATE 150 MG/ML IM SUSY: PREFILLED_SYRINGE | INTRAMUSCULAR | 0 refills | Status: DC

## 2020-01-18 NOTE — Progress Notes (Signed)
Advised pt one more depo refill will be sent but she needs annual exam before any further refills, pt voices understanding.

## 2020-01-20 ENCOUNTER — Ambulatory Visit: Payer: Medicaid Other

## 2020-01-27 ENCOUNTER — Encounter: Payer: Self-pay | Admitting: Obstetrics

## 2020-01-27 ENCOUNTER — Ambulatory Visit: Payer: Medicaid Other | Admitting: Obstetrics

## 2020-01-27 ENCOUNTER — Other Ambulatory Visit (HOSPITAL_COMMUNITY)
Admission: RE | Admit: 2020-01-27 | Discharge: 2020-01-27 | Disposition: A | Discharge status: Home / Self Care | Payer: Medicaid Other | Source: Ambulatory Visit | Attending: Obstetrics | Admitting: Obstetrics

## 2020-01-27 ENCOUNTER — Other Ambulatory Visit: Payer: Self-pay

## 2020-01-27 VITALS — BP 141/83 | HR 88 | Wt 248.0 lb

## 2020-01-27 DIAGNOSIS — Z01419 Encounter for gynecological examination (general) (routine) without abnormal findings: Secondary | ICD-10-CM | POA: Insufficient documentation

## 2020-01-27 DIAGNOSIS — Z3202 Encounter for pregnancy test, result negative: Secondary | ICD-10-CM

## 2020-01-27 DIAGNOSIS — E669 Obesity, unspecified: Secondary | ICD-10-CM

## 2020-01-27 DIAGNOSIS — F17210 Nicotine dependence, cigarettes, uncomplicated: Secondary | ICD-10-CM

## 2020-01-27 DIAGNOSIS — N898 Other specified noninflammatory disorders of vagina: Secondary | ICD-10-CM | POA: Diagnosis not present

## 2020-01-27 DIAGNOSIS — Z6831 Body mass index (BMI) 31.0-31.9, adult: Secondary | ICD-10-CM

## 2020-01-27 DIAGNOSIS — Z3042 Encounter for surveillance of injectable contraceptive: Secondary | ICD-10-CM | POA: Diagnosis not present

## 2020-01-27 LAB — POCT URINE PREGNANCY: Preg Test, Ur: NEGATIVE

## 2020-01-27 MED ORDER — MEDROXYPROGESTERONE ACETATE 150 MG/ML IM SUSP
150.0000 mg | Freq: Once | INTRAMUSCULAR | Status: AC
  Administered 2020-01-27: 150 mg via INTRAMUSCULAR

## 2020-01-27 NOTE — Progress Notes (Signed)
Subjective:        Lorraine Nguyen is a 34 y.o. female here for a routine exam.  Current complaints: None.    Personal health questionnaire:  Is patient Ashkenazi Jewish, have a family history of breast and/or ovarian cancer: no Is there a family history of uterine cancer diagnosed at age < 68, gastrointestinal cancer, urinary tract cancer, family member who is a Field seismologist syndrome-associated carrier: no Is the patient overweight and hypertensive, family history of diabetes, personal history of gestational diabetes, preeclampsia or PCOS: yes Is patient over 71, have PCOS,  family history of premature CHD under age 55, diabetes, smoke, have hypertension or peripheral artery disease:  no At any time, has a partner hit, kicked or otherwise hurt or frightened you?: no Over the past 2 weeks, have you felt down, depressed or hopeless?: no Over the past 2 weeks, have you felt little interest or pleasure in doing things?:no   Gynecologic History No LMP recorded. Patient has had an injection. Contraception: Depo-Provera injections Last Pap: 2019. Results were: ASCUS with negative High Risk HPV 16 & 18/45 Last mammogram: n/a. Results were: n/a  Obstetric History OB History  Gravida Para Term Preterm AB Living  3 3 3     3   SAB TAB Ectopic Multiple Live Births        0 3    # Outcome Date GA Lbr Len/2nd Weight Sex Delivery Anes PTL Lv  3 Term 12/11/16 [redacted]w[redacted]d 05:02 / 00:01 7 lb 4 oz (3.289 kg) M Vag-Spont None  LIV  2 Term 01/19/08 [redacted]w[redacted]d  7 lb 6 oz (3.345 kg) M Vag-Spont   LIV  1 Term 06/05/06 [redacted]w[redacted]d  6 lb 7 oz (2.92 kg) F Vag-Spont   LIV     Complications: Hypertension affecting pregnancy    Past Medical History:  Diagnosis Date  . Gestational diabetes    glyburide  . Medical history non-contributory     Past Surgical History:  Procedure Laterality Date  . NO PAST SURGERIES       Current Outpatient Medications:  .  medroxyPROGESTERone Acetate 150 MG/ML SUSY, INJECT 1 ML INTO  MUSCLE EVERY 3 MONTHS, Disp: 1 mL, Rfl: 0  Current Facility-Administered Medications:  .  medroxyPROGESTERone (DEPO-PROVERA) injection 150 mg, 150 mg, Intramuscular, Q90 days, Woodroe Mode, MD, 150 mg at 10/15/19 1143 Allergies  Allergen Reactions  . Vicodin [Hydrocodone-Acetaminophen] Itching  . Ibuprofen Itching    Social History   Tobacco Use  . Smoking status: Current Every Day Smoker    Packs/day: 0.25    Types: Cigarettes  . Smokeless tobacco: Current User  Substance Use Topics  . Alcohol use: No    Comment: socially     Family History  Problem Relation Age of Onset  . Diabetes Maternal Grandmother   . Hypertension Maternal Grandmother   . Diabetes Paternal Grandmother   . Hypertension Paternal Grandfather   . Healthy Mother   . Healthy Father       Review of Systems  Constitutional: negative for fatigue and weight loss Respiratory: negative for cough and wheezing Cardiovascular: negative for chest pain, fatigue and palpitations Gastrointestinal: negative for abdominal pain and change in bowel habits Musculoskeletal:negative for myalgias Neurological: negative for gait problems and tremors Behavioral/Psych: negative for abusive relationship, depression Endocrine: negative for temperature intolerance    Genitourinary:negative for abnormal menstrual periods, genital lesions, hot flashes, sexual problems and vaginal discharge Integument/breast: negative for breast lump, breast tenderness, nipple discharge and skin lesion(s)  Objective:       BP (!) 141/83   Pulse 88   Wt 248 lb (112.5 kg)   BMI 31.84 kg/m  General:   alert  Skin:   no rash or abnormalities  Lungs:   clear to auscultation bilaterally  Heart:   regular rate and rhythm, S1, S2 normal, no murmur, click, rub or gallop  Breasts:   normal without suspicious masses, skin or nipple changes or axillary nodes  Abdomen:  normal findings: no organomegaly, soft, non-tender and no hernia  Pelvis:   External genitalia: normal general appearance Urinary system: urethral meatus normal and bladder without fullness, nontender Vaginal: normal without tenderness, induration or masses Cervix: normal appearance Adnexa: normal bimanual exam Uterus: anteverted and non-tender, normal size   Lab Review Urine pregnancy test Labs reviewed yes Radiologic studies reviewed no  50% of 20 min visit spent on counseling and coordination of care.   Assessment:     1. Encounter for routine gynecological examination with Papanicolaou smear of cervix Rx: - Cytology - PAP  2. Vaginal discharge Rx: - Cervicovaginal ancillary only  3. Encounter for surveillance of injectable contraceptive Rx: - POCT urine pregnancy - medroxyPROGESTERone (DEPO-PROVERA) injection 150 mg  4. Tobacco dependence due to cigarettes - cessation with the aid of medication and behavioral modification recommended  5. Obesity (BMI 30.0-34.9) - program of caloric reduction, exercise and behavioral modification recommended    Plan:    Education reviewed: calcium supplements, depression evaluation, low fat, low cholesterol diet, safe sex/STD prevention, self breast exams, smoking cessation and weight bearing exercise. Contraception: Depo-Provera injections. Follow up in: 1 year.   Meds ordered this encounter  Medications  . medroxyPROGESTERone (DEPO-PROVERA) injection 150 mg   Orders Placed This Encounter  Procedures  . POCT urine pregnancy    Brock Bad, MD 01/27/2020 10:19 AM

## 2020-01-28 LAB — CERVICOVAGINAL ANCILLARY ONLY
Bacterial Vaginitis (gardnerella): NEGATIVE
Candida Glabrata: NEGATIVE
Candida Vaginitis: NEGATIVE
Chlamydia: NEGATIVE
Comment: NEGATIVE
Comment: NEGATIVE
Comment: NEGATIVE
Comment: NEGATIVE
Comment: NEGATIVE
Comment: NORMAL
Neisseria Gonorrhea: NEGATIVE
Trichomonas: NEGATIVE

## 2020-02-02 LAB — CYTOLOGY - PAP
Comment: NEGATIVE
Comment: NEGATIVE
Diagnosis: NEGATIVE
HPV 16: NEGATIVE
HPV 18 / 45: NEGATIVE
High risk HPV: POSITIVE — AB

## 2020-02-14 ENCOUNTER — Encounter: Payer: Self-pay | Admitting: Physician Assistant

## 2020-02-14 ENCOUNTER — Ambulatory Visit
Admission: EM | Admit: 2020-02-14 | Discharge: 2020-02-14 | Disposition: A | Discharge status: Home / Self Care | Payer: Medicaid Other | Attending: Physician Assistant | Admitting: Physician Assistant

## 2020-02-14 ENCOUNTER — Other Ambulatory Visit: Payer: Self-pay

## 2020-02-14 DIAGNOSIS — W57XXXA Bitten or stung by nonvenomous insect and other nonvenomous arthropods, initial encounter: Secondary | ICD-10-CM

## 2020-02-14 DIAGNOSIS — S70361A Insect bite (nonvenomous), right thigh, initial encounter: Secondary | ICD-10-CM

## 2020-02-14 DIAGNOSIS — Z5189 Encounter for other specified aftercare: Secondary | ICD-10-CM

## 2020-02-14 MED ORDER — TRIAMCINOLONE ACETONIDE 0.1 % EX CREA: 1.0000 "application " | TOPICAL_CREAM | Freq: Two times a day (BID) | CUTANEOUS | 0 refills | Status: DC

## 2020-02-14 MED ORDER — HYDROXYZINE HCL 25 MG PO TABS: 25.0000 mg | ORAL_TABLET | Freq: Four times a day (QID) | ORAL | 0 refills | Status: DC | PRN

## 2020-02-14 NOTE — Discharge Instructions (Signed)
No signs of infection at this time. Start triamcinolone to affected area. Ice compress if needed for itching. Hydroxyzine for itching. Monitor for signs of infection including spreading redness, warmth, pain, fever.

## 2020-02-14 NOTE — ED Provider Notes (Signed)
[facility]    CSN: [id number] Arrival date & time: 02/14/20  1244      History   Chief Complaint Chief Complaint  Patient presents with  . Insect Bite    HPI Lorraine Nguyen is a 34 y.o. female.   33 year old female comes in for 1 day history of insect bite to right inner thigh. Area is itching, burning in sensation with redness. Denies fever, warmth. Tried EtOH and chlorox on area.      Past Medical History:  Diagnosis Date  . Gestational diabetes    glyburide  . Medical history non-contributory     Patient Active Problem List   Diagnosis Date Noted  . Vaginal delivery 12/11/2016  . Gestational diabetes 10/09/2016  . Elevated AFP 09/24/2016  . Group B streptococcal bacteriuria 05/31/2016  . Supervision of high risk pregnancy, antepartum 05/26/2016    Past Surgical History:  Procedure Laterality Date  . NO PAST SURGERIES      OB History    Gravida  3   Para  3   Term  3   Preterm      AB      Living  3     SAB      TAB      Ectopic      Multiple  0   Live Births  3            Home Medications    Prior to Admission medications   Medication Sig Start Date End Date Taking? Authorizing Provider  hydrOXYzine (ATARAX/VISTARIL) 25 MG tablet Take 1 tablet (25 mg total) by mouth every 6 (six) hours as needed. 02/14/20   Cathie Hoops, [place] Bascomb V, PA-C  medroxyPROGESTERone Acetate 150 MG/ML SUSY INJECT 1 ML INTO MUSCLE EVERY 3 MONTHS 01/18/20   Brock Bad, MD  triamcinolone cream (KENALOG) 0.1 % Apply 1 application topically 2 (two) times daily. 02/14/20   Belinda Fisher, PA-C    Family History Family History  Problem Relation Age of Onset  . Diabetes Maternal Grandmother   . Hypertension Maternal Grandmother   . Diabetes Paternal Grandmother   . Hypertension Paternal Grandfather   . Healthy Mother   . Healthy Father     Social History Social History   Tobacco Use  . Smoking status: Current Every Day Smoker    Packs/day: 0.25   Types: Cigarettes  . Smokeless tobacco: Current User  Substance Use Topics  . Alcohol use: No    Comment: socially   . Drug use: No     Allergies   Vicodin [hydrocodone-acetaminophen] and Ibuprofen   Review of Systems Review of Systems  Reason unable to perform ROS: See HPI as above.     Physical Exam Triage Vital Signs ED Triage Vitals [02/14/20 1249]  Enc Vitals Group     BP 125/85     Pulse Rate 88     Resp 18     Temp 99.1 F (37.3 C)     Temp Source Oral     SpO2 98 %     Weight      Height      Head Circumference      Peak Flow      Pain Score      Pain Loc      Pain Edu?      Excl. in GC?    No data found.  Updated Vital Signs BP 125/85 (BP Location: Left Arm)   Pulse  88   Temp 99.1 F (37.3 C) (Oral)   Resp 18   SpO2 98%   Physical Exam Constitutional:      General: She is not in acute distress.    Appearance: Normal appearance. She is well-developed. She is not toxic-appearing or diaphoretic.  HENT:     Head: Normocephalic and atraumatic.  Eyes:     Conjunctiva/sclera: Conjunctivae normal.     Pupils: Pupils are equal, round, and reactive to light.  Pulmonary:     Effort: Pulmonary effort is normal. No respiratory distress.     Comments: Speaking in full sentences without difficulty Musculoskeletal:     Cervical back: Normal range of motion and neck supple.  Skin:    General: Skin is warm and dry.     Comments: Pinpoint scabbing with surrounding erythema to medial right thigh. No warmth, induration, fluctuance. No active drainage.   Neurological:     Mental Status: She is alert and oriented to person, place, and time.      UC Treatments / Results  Labs (all labs ordered are listed, but only abnormal results are displayed) Labs Reviewed - No data to display  EKG   Radiology No results found.  Procedures Procedures (including critical care time)  Medications Ordered in UC Medications - No data to display  Initial  Impression / Assessment and Plan / UC Course  I have reviewed the triage vital signs and the nursing notes.  Pertinent labs & imaging results that were available during my care of the patient were reviewed by me and considered in my medical decision making (see chart for details).    No signs of bacterial infection. Will start symptomatic treatment. Return precautions given. Patient expresses understanding and agrees to plan.  Final Clinical Impressions(s) / UC Diagnoses   Final diagnoses:  Visit for wound check  Insect bite of right thigh, initial encounter    ED Prescriptions    Medication Sig Dispense Auth. Provider   triamcinolone cream (KENALOG) 0.1 % Apply 1 application topically 2 (two) times daily. 30 g Lyndal Reggio V, PA-C   hydrOXYzine (ATARAX/VISTARIL) 25 MG tablet Take 1 tablet (25 mg total) by mouth every 6 (six) hours as needed. 16 tablet Ok Edwards, PA-C     PDMP not reviewed this encounter.   Ok Edwards, PA-C 02/14/20 1329

## 2020-02-14 NOTE — ED Triage Notes (Signed)
Seen by provider prior to this nurse 

## 2020-04-19 ENCOUNTER — Other Ambulatory Visit: Payer: Self-pay

## 2020-04-19 ENCOUNTER — Ambulatory Visit
Admission: EM | Admit: 2020-04-19 | Discharge: 2020-04-19 | Disposition: A | Discharge status: Home / Self Care | Payer: Medicaid Other | Attending: Physician Assistant | Admitting: Physician Assistant

## 2020-04-19 DIAGNOSIS — K0889 Other specified disorders of teeth and supporting structures: Secondary | ICD-10-CM

## 2020-04-19 MED ORDER — AMOXICILLIN-POT CLAVULANATE 875-125 MG PO TABS: 1.0000 | ORAL_TABLET | Freq: Two times a day (BID) | ORAL | 0 refills | Status: DC

## 2020-04-19 MED ORDER — CHLORHEXIDINE GLUCONATE 0.12 % MT SOLN: 10.0000 mL | Freq: Two times a day (BID) | OROMUCOSAL | 0 refills | Status: DC

## 2020-04-19 MED ORDER — FLUCONAZOLE 150 MG PO TABS: 150.0000 mg | ORAL_TABLET | Freq: Every day | ORAL | 0 refills | Status: DC

## 2020-04-19 NOTE — ED Triage Notes (Signed)
Pt c/o pain to lower right tooth for approx 4-5 days. Pt states she has appt mid-September for probable extraction. Denies fever, chills.  Took 600mg  ibuprofen at 0600 today.

## 2020-04-19 NOTE — Discharge Instructions (Signed)
Start Augmentin as directed for dental infection. Peridex for dental hygiene. Follow up with dentist for further treatment and evaluation. If experiencing swelling of the throat, trouble breathing, trouble swallowing, leaning forward to breath, drooling, go to the emergency department for further evaluation.   

## 2020-04-19 NOTE — ED Provider Notes (Signed)
[facility]    CSN: [id number] Arrival date & time: 04/19/20  1541      History   Chief Complaint Chief Complaint  Patient presents with   Dental Pain    HPI Lorraine Nguyen is a 34 y.o. female.   34 year old female comes in for 4 to 5-day history of dental pain.  Has had known cavities that tends to be extracted mid-September.  No new injury/trauma.  States now with pain to right lower jaw/tooth with facial swelling.  [facility], and was told to come here for evaluation.  Denies swelling of the throat, tripoding drooling, trismus.  Denies fever.     Past Medical History:  Diagnosis Date   Gestational diabetes    glyburide   Medical history non-contributory     Patient Active Problem List   Diagnosis Date Noted   Vaginal delivery 12/11/2016   Gestational diabetes 10/09/2016   Elevated AFP 09/24/2016   Group B streptococcal bacteriuria 05/31/2016   Supervision of high risk pregnancy, antepartum 05/26/2016    Past Surgical History:  Procedure Laterality Date   NO PAST SURGERIES      OB History    Gravida  3   Para  3   Term  3   Preterm      AB      Living  3     SAB      TAB      Ectopic      Multiple  0   Live Births  3            Home Medications    Prior to Admission medications   Medication Sig Start Date End Date Taking? Authorizing Provider  medroxyPROGESTERone Acetate 150 MG/ML SUSY INJECT 1 ML INTO MUSCLE EVERY 3 MONTHS 01/18/20  Yes Brock Bad, MD  amoxicillin-clavulanate (AUGMENTIN) 875-125 MG tablet Take 1 tablet by mouth every 12 (twelve) hours. 04/19/20   Cathie Hoops, Jahleel Stroschein V, PA-C  chlorhexidine (PERIDEX) 0.12 % solution Use as directed 10 mLs in the mouth or throat 2 (two) times daily. 04/19/20   Cathie Hoops, Ledora Delker V, PA-C  fluconazole (DIFLUCAN) 150 MG tablet Take 1 tablet (150 mg total) by mouth daily. Take second dose 72 hours later if symptoms still persists. 04/19/20   Cathie Hoops, Willoughby Doell V,  PA-C  hydrOXYzine (ATARAX/VISTARIL) 25 MG tablet Take 1 tablet (25 mg total) by mouth every 6 (six) hours as needed. 02/14/20   Cathie Hoops, Oralee Rapaport V, PA-C  triamcinolone cream (KENALOG) 0.1 % Apply 1 application topically 2 (two) times daily. 02/14/20   Belinda Fisher, PA-C    Family History Family History  Problem Relation Age of Onset   Diabetes Maternal Grandmother    Hypertension Maternal Grandmother    Diabetes Paternal Grandmother    Hypertension Paternal Grandfather    Healthy Mother    Healthy Father     Social History Social History   Tobacco Use   Smoking status: Current Every Day Smoker    Packs/day: 0.25    Types: Cigarettes   Smokeless tobacco: Current User  Substance Use Topics   Alcohol use: No    Comment: socially    Drug use: No     Allergies   Vicodin [hydrocodone-acetaminophen] and Ibuprofen   Review of Systems Review of Systems  Reason unable to perform ROS: See HPI as above.     Physical Exam Triage Vital Signs ED Triage Vitals  Enc Vitals Group  BP 04/19/20 1627 129/82     Pulse Rate 04/19/20 1627 (!) 105     Resp 04/19/20 1627 18     Temp 04/19/20 1627 100.2 F (37.9 C)     Temp Source 04/19/20 1627 Oral     SpO2 04/19/20 1627 98 %     Weight --      Height --      Head Circumference --      Peak Flow --      Pain Score 04/19/20 1644 8     Pain Loc --      Pain Edu? --      Excl. in GC? --    No data found.  Updated Vital Signs BP 129/82 (BP Location: Left Arm)    Pulse (!) 105    Temp (!) 100.5 F (38.1 C) (Oral)    Resp 18    SpO2 98%   Physical Exam Constitutional:      General: She is not in acute distress.    Appearance: She is well-developed. She is not ill-appearing, toxic-appearing or diaphoretic.  HENT:     Head: Normocephalic and atraumatic.     Jaw: No trismus.     Mouth/Throat:     Mouth: Mucous membranes are moist.     Pharynx: Oropharynx is clear. Uvula midline. No uvula swelling.     Tonsils: No tonsillar  exudate.     Comments: Overall poor dental hygiene with multiple dental caries. Right lower jaw with posterior molar cavities to the gumline. Gums swelling with tenderness to palpation. No obvious fluctuance felt.   Floor of mouth soft to palpation. Mild right facial swelling.  Musculoskeletal:     Cervical back: Normal range of motion and neck supple.  Skin:    General: Skin is warm and dry.  Neurological:     Mental Status: She is alert and oriented to person, place, and time.      UC Treatments / Results  Labs (all labs ordered are listed, but only abnormal results are displayed) Labs Reviewed - No data to display  EKG   Radiology No results found.  Procedures Procedures (including critical care time)  Medications Ordered in UC Medications - No data to display  Initial Impression / Assessment and Plan / UC Course  I have reviewed the triage vital signs and the nursing notes.  Pertinent labs & imaging results that were available during my care of the patient were reviewed by me and considered in my medical decision making (see chart for details).    Patient febrile at 100.5. nontoxic. No obvious fluctuance on exam. Start Augmentin as directed. Discussed with patient symptoms can return if dental problem is not addressed. Follow up with dentist for further evaluation and treatment of dental pain. Return precautions given.   Final Clinical Impressions(s) / UC Diagnoses   Final diagnoses:  Pain, dental    ED Prescriptions    Medication Sig Dispense Auth. Provider   amoxicillin-clavulanate (AUGMENTIN) 875-125 MG tablet Take 1 tablet by mouth every 12 (twelve) hours. 14 tablet Koi Zangara V, PA-C   fluconazole (DIFLUCAN) 150 MG tablet Take 1 tablet (150 mg total) by mouth daily. Take second dose 72 hours later if symptoms still persists. 2 tablet Lasonja Lakins V, PA-C   chlorhexidine (PERIDEX) 0.12 % solution Use as directed 10 mLs in the mouth or throat 2 (two) times daily. 240  mL Cathie Hoops, Sharlene Mccluskey V, PA-C     I have reviewed the PDMP during  this encounter.   Belinda Fisher, PA-C 04/19/20 1719

## 2020-04-20 ENCOUNTER — Ambulatory Visit: Payer: Medicaid Other

## 2020-04-26 ENCOUNTER — Ambulatory Visit: Payer: Medicaid Other

## 2020-04-28 ENCOUNTER — Other Ambulatory Visit: Payer: Self-pay

## 2020-04-28 DIAGNOSIS — Z3042 Encounter for surveillance of injectable contraceptive: Secondary | ICD-10-CM

## 2020-04-28 MED ORDER — MEDROXYPROGESTERONE ACETATE 150 MG/ML IM SUSY: PREFILLED_SYRINGE | INTRAMUSCULAR | 2 refills | Status: DC

## 2020-05-03 ENCOUNTER — Ambulatory Visit: Payer: Medicaid Other

## 2020-05-05 ENCOUNTER — Ambulatory Visit (INDEPENDENT_AMBULATORY_CARE_PROVIDER_SITE_OTHER): Payer: Medicaid Other

## 2020-05-05 ENCOUNTER — Other Ambulatory Visit: Payer: Self-pay

## 2020-05-05 VITALS — BP 123/85 | HR 85 | Ht 74.0 in

## 2020-05-05 DIAGNOSIS — Z3042 Encounter for surveillance of injectable contraceptive: Secondary | ICD-10-CM

## 2020-05-05 LAB — POCT URINE PREGNANCY: Preg Test, Ur: NEGATIVE

## 2020-05-05 MED ORDER — MEDROXYPROGESTERONE ACETATE 150 MG/ML IM SUSP: 150.0000 mg | Freq: Once | INTRAMUSCULAR | Status: DC

## 2020-05-05 NOTE — Progress Notes (Signed)
Patient was assessed and managed by nursing staff during this encounter. I have reviewed the chart and agree with the documentation and plan. I have also made any necessary editorial changes.  Warden Fillers, MD 05/05/2020 11:47 AM

## 2020-05-05 NOTE — Progress Notes (Signed)
Pt. Presented for depo injection on 05/06/2020.  Last Depo: 01/27/2020 LMP: Unknown Pt. Stated she has participated in unprotected intercourse in the last two weeks. UPT in office: Negative Advised pt. Depo can not be given today due to unprotected intercourse in the last two weeks and being outside of depo window. Informed pt. To come back on 05/19/2020 for another in office UPT. If negative and has not had unprotected intercourse, then pt. Can restart the depo injection. Pt. Voiced understanding.

## 2020-05-19 ENCOUNTER — Ambulatory Visit: Payer: Medicaid Other

## 2020-05-23 ENCOUNTER — Other Ambulatory Visit: Payer: Self-pay

## 2020-05-23 ENCOUNTER — Ambulatory Visit (INDEPENDENT_AMBULATORY_CARE_PROVIDER_SITE_OTHER): Payer: Medicaid Other

## 2020-05-23 DIAGNOSIS — Z3042 Encounter for surveillance of injectable contraceptive: Secondary | ICD-10-CM

## 2020-05-23 LAB — POCT URINE PREGNANCY: Preg Test, Ur: NEGATIVE

## 2020-05-23 MED ORDER — MEDROXYPROGESTERONE ACETATE 150 MG/ML IM SUSP
150.0000 mg | INTRAMUSCULAR | Status: DC
  Administered 2020-05-23: 150 mg via INTRAMUSCULAR

## 2020-05-23 NOTE — Progress Notes (Signed)
Patient presents for 2nd UPT and Depo injection per notes.  UPT: Negative  Pt denies any unprotected intercourse.  Injection given w/o any problems LUOQ   Next Depo Due 08/08/20-08/22/20  Pt advised to make next Depo appt. Pt agreeable and voiced understanding.

## 2020-05-24 NOTE — Progress Notes (Signed)
Patient was assessed and managed by nursing staff during this encounter. I have reviewed the chart and agree with the documentation and plan. I have also made any necessary editorial changes.  Thayne Cindric, MD 05/24/2020 6:00 AM 

## 2020-05-29 ENCOUNTER — Other Ambulatory Visit: Payer: Self-pay

## 2020-05-29 ENCOUNTER — Encounter: Payer: Self-pay | Admitting: Emergency Medicine

## 2020-05-29 ENCOUNTER — Ambulatory Visit
Admission: EM | Admit: 2020-05-29 | Discharge: 2020-05-29 | Disposition: A | Discharge status: Home / Self Care | Payer: Medicaid Other | Attending: Urgent Care | Admitting: Urgent Care

## 2020-05-29 DIAGNOSIS — M542 Cervicalgia: Secondary | ICD-10-CM

## 2020-05-29 DIAGNOSIS — M549 Dorsalgia, unspecified: Secondary | ICD-10-CM

## 2020-05-29 DIAGNOSIS — M545 Low back pain, unspecified: Secondary | ICD-10-CM

## 2020-05-29 MED ORDER — TIZANIDINE HCL 4 MG PO TABS: 4.0000 mg | ORAL_TABLET | Freq: Four times a day (QID) | ORAL | 0 refills | Status: DC | PRN

## 2020-05-29 MED ORDER — NAPROXEN 500 MG PO TABS: 500.0000 mg | ORAL_TABLET | Freq: Two times a day (BID) | ORAL | 0 refills | Status: DC
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
## 2020-05-29 NOTE — ED Triage Notes (Signed)
Pt here for generalized back pain x 4 days; denies obvious injury

## 2020-08-15 ENCOUNTER — Ambulatory Visit: Payer: Medicaid Other

## 2020-09-29 ENCOUNTER — Telehealth: Payer: Medicaid Other | Admitting: Internal Medicine

## 2020-09-29 DIAGNOSIS — M549 Dorsalgia, unspecified: Secondary | ICD-10-CM | POA: Diagnosis not present

## 2020-09-29 DIAGNOSIS — N760 Acute vaginitis: Secondary | ICD-10-CM | POA: Diagnosis not present

## 2020-09-29 DIAGNOSIS — G8929 Other chronic pain: Secondary | ICD-10-CM | POA: Diagnosis not present

## 2020-09-29 MED ORDER — TIZANIDINE HCL 4 MG PO TABS: 4.0000 mg | ORAL_TABLET | Freq: Four times a day (QID) | ORAL | 0 refills | Status: DC | PRN

## 2020-09-29 MED ORDER — METRONIDAZOLE 500 MG PO TABS: 500.0000 mg | ORAL_TABLET | Freq: Two times a day (BID) | ORAL | 0 refills | Status: DC

## 2020-09-29 MED ORDER — FLUCONAZOLE 150 MG PO TABS: 150.0000 mg | ORAL_TABLET | Freq: Once | ORAL | 0 refills | Status: AC

## 2020-09-29 NOTE — Patient Instructions (Signed)
Vaginitis  Vaginitis is irritation and swelling of the vagina. Treatment will depend on the cause. What are the causes? It can be caused by:  Bacteria.  Yeast.  A parasite.  A virus.  Low hormone levels.  Bubble baths, scented tampons, and feminine sprays. Other things can change the balance of the yeast and bacteria that live in the vagina. These include:  Antibiotic medicines.  Not being clean enough.  Some birth control methods.  Sex.  Infection.  Diabetes.  A weakened body defense system (immune system). What increases the risk?  Smoking or being around someone who smokes.  Using washes (douches), scented tampons, or scented pads.  Wearing tight pants or thong underwear.  Using birth control pills or an IUD.  Having sex without a condom or having a lot of partners.  Having an STI.  Using a certain product to kill sperm (nonoxynol-9).  Eating foods that are high in sugar.  Having diabetes.  Having low levels of a female hormone.  Having a weakened body defense system.  Being pregnant or breastfeeding. What are the signs or symptoms?  Fluid coming from the vagina that is not normal.  A bad smell.  Itching, pain, or swelling.  Pain with sex.  Pain or burning when you pee (urinate). Sometimes there are no symptoms. How is this treated? Treatment may include:  Antibiotic creams or pills.  Antifungal medicines.  Medicines to ease symptoms if you have a virus. Your sex partner should also be treated.  Estrogen medicines.  Avoiding scented soaps, sprays, or douches.  Stopping use of products that caused irritation and then using a cream to treat symptoms. Follow these instructions at home: Lifestyle  Keep the area around your vagina clean and dry. ? Avoid using soap. ? Rinse the area with water.  Until your doctor says it is okay: ? Do not use washes for the vagina. ? Do not use tampons. ? Do not have sex.  Wipe from front to  back after going to the bathroom.  When your doctor says it is okay, practice safe sex and use condoms. General instructions  Take over-the-counter and prescription medicines only as told by your doctor.  If you were prescribed an antibiotic medicine, take or use it as told by your doctor. Do not stop taking or using it even if you start to feel better.  Keep all follow-up visits. How is this prevented?  Do not use things that can irritate the vagina, such as fabric softeners. Avoid these products if they are scented: ? Sprays. ? Detergents. ? Tampons. ? Products for cleaning the vagina. ? Soaps or bubble baths.  Let air reach your vagina. To do this: ? Wear cotton underwear. ? Do not wear:  Underwear while you sleep.  Tight pants.  Thong underwear.  Underwear or nylons without a cotton panel. ? Take off any wet clothing, such as bathing suits, as soon as you can. ? Practice safe sex and use condoms. Contact a doctor if:  You have pain in your belly or in the area between your hips.  You have a fever or chills.  Your symptoms last for more than 2-3 days. Get help right away if:  You have a fever and your symptoms get worse all of a sudden. Summary  Vaginitis is irritation and swelling of the vagina.  Treatment will depend on the cause of the condition.  Do not use washes or tampons or have sex until your doctor says it   is okay. This information is not intended to replace advice given to you by your health care provider. Make sure you discuss any questions you have with your health care provider. Document Revised: 02/24/2020 Document Reviewed: 02/24/2020 Elsevier Patient Education  2021 Elsevier Inc.  

## 2020-09-29 NOTE — Progress Notes (Signed)
Virtual Visit via Telephone Note   This visit type was conducted due to national recommendations for restrictions regarding the COVID-19 Pandemic (e.g. social distancing) in an effort to limit this patient's exposure and mitigate transmission in our community.  Due to her co-morbid illnesses, this patient is at least at moderate risk for complications without adequate follow up.  This format is felt to be most appropriate for this patient at this time.  The patient did not have access to video technology/had technical difficulties with video requiring transitioning to audio format only (telephone).  All issues noted in this document were discussed and addressed.  No physical exam could be performed with this format.  Evaluation Performed:  Follow-up visit  Date:  09/29/2020   ID:  Lorraine Nguyen, DOB 1985-11-24, MRN [id number]  Patient Location: Home Provider Location: Office/Clinic  Participants: Patient Location of Patient: Home Location of Provider: Telehealth Consent was obtain for visit to be over via telehealth. I verified that I am speaking with the correct person using two identifiers.  PCP:  Patient, No Pcp Per   Chief Complaint:  Vaginal discharge   History of Present Illness:    Lorraine Nguyen is a 35 y.o. female who has a televisit for c/o vaginal discharge for last 7 days, which is white in color. She also c/o lower abdominal discomfort, but denies any dysuria or hematuria. She denies any fever, chills, nausea or vomiting.  She also c/o chronic back pain, which is intermittent. Denies any recent injury. Denies any numbness, weakness or tingling in the LE. No imaging of the lumbar spine available to review. She was prescribed Zanaflex about few months ago, which helps with her back pain and she has to take it occasionally when she has flare of back pain.  The patient does not have symptoms concerning for COVID-19 infection (fever, chills, cough, or new shortness  of breath).   Past Medical, Surgical, Social History, Allergies, and Medications have been Reviewed.  Past Medical History:  Diagnosis Date  . Gestational diabetes    glyburide  . Medical history non-contributory    Past Surgical History:  Procedure Laterality Date  . NO PAST SURGERIES       No outpatient medications have been marked as taking for the 09/29/20 encounter (Appointment) with Anabel Halon, MD.   Current Facility-Administered Medications for the 09/29/20 encounter (Appointment) with Anabel Halon, MD  Medication  . medroxyPROGESTERone (DEPO-PROVERA) injection 150 mg  . medroxyPROGESTERone (DEPO-PROVERA) injection 150 mg     Allergies:   Vicodin [hydrocodone-acetaminophen]   ROS:   Please see the history of present illness.    Review of Systems  Constitutional: Negative for chills and fever.  Gastrointestinal: Negative for diarrhea, nausea and vomiting.  Genitourinary: Negative for dysuria and hematuria.       C/o whitish vaginal discharge  Musculoskeletal: Positive for back pain. Negative for falls and myalgias.     Labs/Other Tests and Data Reviewed:    Recent Labs: No results found for requested labs within last 8760 hours.   Recent Lipid Panel No results found for: CHOL, TRIG, HDL, CHOLHDL, LDLCALC, LDLDIRECT  Wt Readings from Last 3 Encounters:  01/27/20 248 lb (112.5 kg)  09/30/19 250 lb (113.4 kg)  08/31/18 246 lb 12.8 oz (111.9 kg)      ASSESSMENT & PLAN:    Acute vaginitis Prescribed Flagyl Fluconazole for possible fungal co-infection Advised to keep perineal area clean and dry, okay to use Vagisil  Chronic back pain Tylenol PRN Zanaflex prescribed No signs of neuropathy Advised to see PCP with persistent symptoms   Time:   Today, I have spent 15 minutes reviewing the chart, including problem list, medications, and with the patient with telehealth technology discussing the above problems.   Medication Adjustments/Labs and  Tests Ordered: Current medicines are reviewed at length with the patient today.  Concerns regarding medicines are outlined above.   Tests Ordered: No orders of the defined types were placed in this encounter.   Medication Changes: No orders of the defined types were placed in this encounter.    Note: This dictation was prepared with Dragon dictation along with smaller phrase technology. Similar sounding words can be transcribed inadequately or may not be corrected upon review. Any transcriptional errors that result from this process are unintentional.      Disposition:  Follow up  Signed, Anabel Halon, MD  09/29/2020 2:52 PM     [facility]

## 2020-11-11 ENCOUNTER — Encounter: Payer: Medicaid Other | Admitting: Physician Assistant

## 2020-11-11 NOTE — Progress Notes (Signed)
Have attempted to connect with patient on multiple occasions without any success. Received message that I was" the only participant" .placed patient back in the que.

## 2021-02-21 ENCOUNTER — Telehealth: Payer: Medicaid Other | Admitting: Physician Assistant

## 2021-02-21 DIAGNOSIS — N898 Other specified noninflammatory disorders of vagina: Secondary | ICD-10-CM | POA: Diagnosis not present

## 2021-02-21 MED ORDER — METRONIDAZOLE 500 MG PO TABS: 500.0000 mg | ORAL_TABLET | Freq: Two times a day (BID) | ORAL | 0 refills | Status: DC

## 2021-02-21 NOTE — Progress Notes (Signed)
Virtual Visit Consent   Lorraine Nguyen, you are scheduled for a virtual visit with a [facility] provider today.     Just as with appointments in the office, your consent must be obtained to participate.  Your consent will be active for this visit and any virtual visit you may have with one of our providers in the next 365 days.     If you have a MyChart account, a copy of this consent can be sent to you electronically.  All virtual visits are billed to your insurance company just like a traditional visit in the office.    As this is a virtual visit, video technology does not allow for your provider to perform a traditional examination.  This may limit your provider's ability to fully assess your condition.  If your provider identifies any concerns that need to be evaluated in person or the need to arrange testing (such as labs, EKG, etc.), we will make arrangements to do so.     Although advances in technology are sophisticated, we cannot ensure that it will always work on either your end or our end.  If the connection with a video visit is poor, the visit may have to be switched to a telephone visit.  With either a video or telephone visit, we are not always able to ensure that we have a secure connection.     I need to obtain your verbal consent now.   Are you willing to proceed with your visit today?    Lorraine Nguyen has provided verbal consent on 02/21/2021 for a virtual visit (video or telephone).   Piedad Climes, [place]   Date: 02/21/2021 2:11 PM   Virtual Visit via Video Note   I, Piedad Climes, connected with Lorraine Nguyen ([id number], April 29, 1986) on 02/21/21 at  2:00 PM EDT by a video-enabled telemedicine application and verified that I am speaking with the correct person using two identifiers.  Location: Patient: Virtual Visit Location Patient: Home Provider: Virtual Visit Location Provider: Home Office   I discussed the limitations of evaluation and  management by telemedicine and the availability of in person appointments. The patient expressed understanding and agreed to proceed.    History of Present Illness: Lorraine Nguyen is a 35 y.o. who identifies as a female who was assigned female at birth, and is being seen today for vaginal discharge that started this past week. Notes thin white-yellow discharge. Some itching and odor with this. Denies vaginal pain, abdominal pain, dysuria, urinary urgency/frequency or hematuria. LMP ended about 2 weeks ago. Is sexually active. Denies any known exposure to STD but states she did have intercourse with a new partner recently, using a condom but unsure if this was kep on the entire time. Denies any vaginal lesions. Did note symptoms started right after she switched some feminine hygiene products. Has history of BV but was asymptomatic when she was diagnosed with this before.    HPI: HPI  Problems:  Patient Active Problem List   Diagnosis Date Noted   Vaginal delivery 12/11/2016   Gestational diabetes 10/09/2016   Elevated AFP 09/24/2016   Group B streptococcal bacteriuria 05/31/2016   Supervision of high risk pregnancy, antepartum 05/26/2016    Allergies:  Allergies  Allergen Reactions   Vicodin [Hydrocodone-Acetaminophen] Itching   Medications:  Current Outpatient Medications:    metroNIDAZOLE (FLAGYL) 500 MG tablet, Take 1 tablet (500 mg total) by mouth 2 (two) times daily., Disp: 14 tablet, Rfl: 0  chlorhexidine (PERIDEX) 0.12 % solution, Use as directed 10 mLs in the mouth or throat 2 (two) times daily., Disp: 240 mL, Rfl: 0   hydrOXYzine (ATARAX/VISTARIL) 25 MG tablet, Take 1 tablet (25 mg total) by mouth every 6 (six) hours as needed., Disp: 16 tablet, Rfl: 0   medroxyPROGESTERone Acetate 150 MG/ML SUSY, INJECT 1 ML INTO MUSCLE EVERY 3 MONTHS, Disp: 1 mL, Rfl: 2   naproxen (NAPROSYN) 500 MG tablet, Take 1 tablet (500 mg total) by mouth 2 (two) times daily with a meal., Disp: 30  tablet, Rfl: 0   triamcinolone cream (KENALOG) 0.1 %, Apply 1 application topically 2 (two) times daily., Disp: 30 g, Rfl: 0  Current Facility-Administered Medications:    medroxyPROGESTERone (DEPO-PROVERA) injection 150 mg, 150 mg, Intramuscular, Q90 days, Adam Phenix, MD, 150 mg at 10/15/19 1143   medroxyPROGESTERone (DEPO-PROVERA) injection 150 mg, 150 mg, Intramuscular, Q90 days, Johnny Bridge, MD, 150 mg at 05/23/20 1516  Observations/Objective: Patient is well-developed, well-nourished in no acute distress.  Resting comfortably at home.  Head is normocephalic, atraumatic.  No labored breathing. Speech is clear and coherent with logical content.  Patient is alert and oriented at baseline.   Assessment and Plan: 1. Vaginal discharge - metroNIDAZOLE (FLAGYL) 500 MG tablet; Take 1 tablet (500 mg total) by mouth 2 (two) times daily.  Dispense: 14 tablet; Refill: 0 Giving change in products recently, history of BV and thin odorous discharge with itch, suspect likely bv as cause of symptoms. Giving recent intercourse with new partner, even with condom use (since she is unsure of consistency of this) she does need full STD pl to make sure no other secondary causes present. Will give Rx for Flagyl to take as directed for BV until she can be seen in person for STI check. She is aware to avoid any sexual activity until she gets full assessment.  Follow Up Instructions: I discussed the assessment and treatment plan with the patient. The patient was provided an opportunity to ask questions and all were answered. The patient agreed with the plan and demonstrated an understanding of the instructions.  A copy of instructions were sent to the patient via MyChart.  The patient was advised to call back or seek an in-person evaluation if the symptoms worsen or if the condition fails to improve as anticipated.  Time:  I spent 12 minutes with the patient via telehealth technology discussing the  above problems/concerns.    Piedad Climes, PA-C

## 2021-02-21 NOTE — Patient Instructions (Signed)
  Lorraine Nguyen, thank you for joining Piedad Climes, PA-C for today's virtual visit.  While this provider is not your primary care provider (PCP), if your PCP is located in our provider database this encounter information will be shared with them immediately following your visit.  Consent: (Patient) Lorraine Nguyen provided verbal consent for this virtual visit at the beginning of the encounter.  Current Medications:  Current Outpatient Medications:    metroNIDAZOLE (FLAGYL) 500 MG tablet, Take 1 tablet (500 mg total) by mouth 2 (two) times daily., Disp: 14 tablet, Rfl: 0   chlorhexidine (PERIDEX) 0.12 % solution, Use as directed 10 mLs in the mouth or throat 2 (two) times daily., Disp: 240 mL, Rfl: 0   hydrOXYzine (ATARAX/VISTARIL) 25 MG tablet, Take 1 tablet (25 mg total) by mouth every 6 (six) hours as needed., Disp: 16 tablet, Rfl: 0   medroxyPROGESTERone Acetate 150 MG/ML SUSY, INJECT 1 ML INTO MUSCLE EVERY 3 MONTHS, Disp: 1 mL, Rfl: 2   naproxen (NAPROSYN) 500 MG tablet, Take 1 tablet (500 mg total) by mouth 2 (two) times daily with a meal., Disp: 30 tablet, Rfl: 0   triamcinolone cream (KENALOG) 0.1 %, Apply 1 application topically 2 (two) times daily., Disp: 30 g, Rfl: 0  Current Facility-Administered Medications:    medroxyPROGESTERone (DEPO-PROVERA) injection 150 mg, 150 mg, Intramuscular, Q90 days, Adam Phenix, MD, 150 mg at 10/15/19 1143   medroxyPROGESTERone (DEPO-PROVERA) injection 150 mg, 150 mg, Intramuscular, Q90 days, Johnny Bridge, MD, 150 mg at 05/23/20 1516   Medications ordered in this encounter:  Meds ordered this encounter  Medications   metroNIDAZOLE (FLAGYL) 500 MG tablet    Sig: Take 1 tablet (500 mg total) by mouth 2 (two) times daily.    Dispense:  14 tablet    Refill:  0    Order Specific Question:   Supervising Provider    Answer:   Hyacinth Meeker, BRIAN [[id number]]     *If you need refills on other medications prior to your next appointment,  please contact your pharmacy*  Follow-Up: Call back or seek an in-person evaluation if the symptoms worsen or if the condition fails to improve as anticipated.  Other Instructions Take the medication as directed for suspected BV.  Keep hydrated. Avoid any sexual activity until you can be seen in person to get an STI panel. This is very important to have done to make sure there is nothing else concerning present.  Please do not delay care   If you have been instructed to have an in-person evaluation today at a local Urgent Care facility, please use the link below. It will take you to a list of all of our available [facility], including address, phone number and hours of operation. Please do not delay care.  [facility]  If you or a family member do not have a primary care provider, use the link below to schedule a visit and establish care. When you choose a [facility] primary care physician or advanced practice provider, you gain a long-term partner in health. Find a Primary Care Provider  Learn more about [facility]'s in-office and virtual care options: [facility] - Get Care Now

## 2021-03-24 ENCOUNTER — Encounter: Payer: Self-pay | Admitting: Physician Assistant

## 2021-03-24 ENCOUNTER — Telehealth: Payer: Medicaid Other | Admitting: Physician Assistant

## 2021-03-24 DIAGNOSIS — B373 Candidiasis of vulva and vagina: Secondary | ICD-10-CM

## 2021-03-24 DIAGNOSIS — B3731 Acute candidiasis of vulva and vagina: Secondary | ICD-10-CM

## 2021-03-24 MED ORDER — FLUCONAZOLE 150 MG PO TABS: 150.0000 mg | ORAL_TABLET | Freq: Once | ORAL | 0 refills | Status: AC

## 2021-03-24 NOTE — Progress Notes (Addendum)
Virtual Visit via Video Note  I connected with Lorraine Nguyen on 03/24/21 at 12:15 PM EDT by a video enabled telemedicine application and verified that I am speaking with the correct person using two identifiers.  Location: Patient: pt's home  Provider: provider's office    I discussed the limitations of evaluation and management by telemedicine and the availability of in person appointments. The patient expressed understanding and agreed to proceed.    I discussed the assessment and treatment plan with the patient. The patient was provided an opportunity to ask questions and all were answered. The patient agreed with the plan and demonstrated an understanding of the instructions.   The patient was advised to call back or seek an in-person evaluation if the symptoms worsen or if the condition fails to improve as anticipated.  I provided 15 minutes of non-face-to-face time during this encounter.   Demetrio Lapping, PA-C   Acute Office Visit  Subjective:    Patient ID: Lorraine Nguyen, female    DOB: 05/22/1986, 35 y.o.   MRN: [id number]  No chief complaint on file.   35 yo F in NAD connects via video for yeast infections. Has has white thick vaginal discharge and vaginal itching x 2 days. Has completed antibiotic for BV, last dose was 2 days ago. Denies any fever chills, dysuria, urgency, frequency, abd pain, n, v, back pain, genital rash. Denies any risk for STD . Denies any current pregnancy.   Urinary Tract Infection  Associated symptoms include a discharge. Pertinent negatives include no chills, flank pain, frequency, hematuria, hesitancy, nausea, possible pregnancy, sweats, urgency or vomiting.  Patient is in today for yeast infection   Past Medical History:  Diagnosis Date   Gestational diabetes    glyburide   Medical history non-contributory     Past Surgical History:  Procedure Laterality Date   NO PAST SURGERIES      Family History  Problem Relation Age of  Onset   Diabetes Maternal Grandmother    Hypertension Maternal Grandmother    Diabetes Paternal Grandmother    Hypertension Paternal Grandfather    Healthy Mother    Healthy Father     Social History   Socioeconomic History   Marital status: Single    Spouse name: Not on file   Number of children: Not on file   Years of education: Not on file   Highest education level: Not on file  Occupational History   Not on file  Tobacco Use   Smoking status: Every Day    Packs/day: 0.25    Types: Cigarettes   Smokeless tobacco: Current  Substance and Sexual Activity   Alcohol use: No    Comment: socially    Drug use: No   Sexual activity: Yes    Partners: Male    Birth control/protection: Injection  Other Topics Concern   Not on file  Social History Narrative   Not on file   Social Determinants of Health   Financial Resource Strain: Not on file  Food Insecurity: Not on file  Transportation Needs: Not on file  Physical Activity: Not on file  Stress: Not on file  Social Connections: Not on file  Intimate Partner Violence: Not on file    Outpatient Medications Prior to Visit  Medication Sig Dispense Refill   chlorhexidine (PERIDEX) 0.12 % solution Use as directed 10 mLs in the mouth or throat 2 (two) times daily. 240 mL 0   hydrOXYzine (ATARAX/VISTARIL) 25 MG tablet Take  1 tablet (25 mg total) by mouth every 6 (six) hours as needed. 16 tablet 0   medroxyPROGESTERone Acetate 150 MG/ML SUSY INJECT 1 ML INTO MUSCLE EVERY 3 MONTHS 1 mL 2   metroNIDAZOLE (FLAGYL) 500 MG tablet Take 1 tablet (500 mg total) by mouth 2 (two) times daily. 14 tablet 0   naproxen (NAPROSYN) 500 MG tablet Take 1 tablet (500 mg total) by mouth 2 (two) times daily with a meal. 30 tablet 0   triamcinolone cream (KENALOG) 0.1 % Apply 1 application topically 2 (two) times daily. 30 g 0   Facility-Administered Medications Prior to Visit  Medication Dose Route Frequency Provider Last Rate Last Admin    medroxyPROGESTERone (DEPO-PROVERA) injection 150 mg  150 mg Intramuscular Q90 days Adam Phenix, MD   150 mg at 10/15/19 1143   medroxyPROGESTERone (DEPO-PROVERA) injection 150 mg  150 mg Intramuscular Q90 days Johnny Bridge, MD   150 mg at 05/23/20 1516    Allergies  Allergen Reactions   Vicodin [Hydrocodone-Acetaminophen] Itching    Review of Systems  Constitutional:  Negative for chills.  Gastrointestinal:  Negative for nausea and vomiting.  Genitourinary:  Negative for flank pain, frequency, hematuria, hesitancy and urgency.  Musculoskeletal:  Negative for back pain.      Objective:    Physical Exam Constitutional:      General: She is not in acute distress.    Appearance: Normal appearance. She is not ill-appearing, toxic-appearing or diaphoretic.  HENT:     Head: Normocephalic and atraumatic.  Eyes:     General: No scleral icterus. Pulmonary:     Effort: Pulmonary effort is normal.  Skin:    Coloration: Skin is not pale.  Neurological:     Mental Status: She is alert and oriented to person, place, and time.  Psychiatric:        Mood and Affect: Mood normal.        Behavior: Behavior normal.        Thought Content: Thought content normal.        Judgment: Judgment normal.    There were no vitals taken for this visit. Wt Readings from Last 3 Encounters:  01/27/20 248 lb (112.5 kg)  09/30/19 250 lb (113.4 kg)  08/31/18 246 lb 12.8 oz (111.9 kg)    Health Maintenance Due  Topic Date Due   COVID-19 Vaccine (1) Never done   Pneumococcal Vaccine 7-37 Years old (1 - PCV) Never done   Hepatitis C Screening  Never done   TETANUS/TDAP  Never done    There are no preventive care reminders to display for this patient.   No results found for: TSH Lab Results  Component Value Date   WBC 13.3 (H) 12/11/2016   HGB 12.5 12/11/2016   HCT 38.3 12/11/2016   MCV 84.4 12/11/2016   PLT 238 12/11/2016   Lab Results  Component Value Date   NA 137 12/15/2007   K  3.7 12/15/2007   CO2 26 12/15/2007   GLUCOSE 95 12/15/2007   BUN 3 (L) 12/15/2007   CREATININE 0.56 12/15/2007   BILITOT 0.2 (L) 12/15/2007   ALKPHOS 134 (H) 12/15/2007   AST 13 12/15/2007   ALT 13 12/15/2007   PROT 6.5 12/15/2007   ALBUMIN 2.6 (L) 12/15/2007   CALCIUM 8.8 12/15/2007   No results found for: CHOL No results found for: HDL No results found for: LDLCALC No results found for: TRIG No results found for: CHOLHDL No results found for: OBSJ6G  Assessment & Plan:   Problem List Items Addressed This Visit   None Visit Diagnoses     Vaginal yeast infection    -  Primary   Relevant Medications   fluconazole (DIFLUCAN) 150 MG tablet        Meds ordered this encounter  Medications   fluconazole (DIFLUCAN) 150 MG tablet    Sig: Take 1 tablet (150 mg total) by mouth once for 1 dose.    Dispense:  1 tablet    Refill:  0    Order Specific Question:   Supervising Provider    Answer:   Eber Hong [[id number]]   Advised to have face to face visit for further evaluation if current symptoms persist.  Demetrio Lapping, PA-C

## 2021-03-26 ENCOUNTER — Encounter: Payer: Self-pay | Admitting: Physician Assistant

## 2021-03-26 ENCOUNTER — Telehealth: Payer: Medicaid Other | Admitting: Physician Assistant

## 2021-03-26 DIAGNOSIS — B379 Candidiasis, unspecified: Secondary | ICD-10-CM | POA: Diagnosis not present

## 2021-03-26 DIAGNOSIS — T3695XA Adverse effect of unspecified systemic antibiotic, initial encounter: Secondary | ICD-10-CM

## 2021-03-26 MED ORDER — FLUCONAZOLE 150 MG PO TABS: 150.0000 mg | ORAL_TABLET | Freq: Once | ORAL | 0 refills | Status: AC

## 2021-03-26 MED ORDER — TERCONAZOLE 0.4 % VA CREA: 1.0000 | TOPICAL_CREAM | Freq: Every day | VAGINAL | 0 refills | Status: DC

## 2021-03-26 NOTE — Patient Instructions (Signed)
Lorraine Nguyen, thank you for joining Margaretann Loveless, PA-C for today's virtual visit.  While this provider is not your primary care provider (PCP), if your PCP is located in our provider database this encounter information will be shared with them immediately following your visit.  Consent: (Patient) Lorraine Nguyen provided verbal consent for this virtual visit at the beginning of the encounter.  Current Medications:  Current Outpatient Medications:    fluconazole (DIFLUCAN) 150 MG tablet, Take 1 tablet (150 mg total) by mouth once for 1 dose. May repeat in 48-72 hours if needed, Disp: 2 tablet, Rfl: 0   terconazole (TERAZOL 7) 0.4 % vaginal cream, Place 1 applicator vaginally at bedtime., Disp: 45 g, Rfl: 0   chlorhexidine (PERIDEX) 0.12 % solution, Use as directed 10 mLs in the mouth or throat 2 (two) times daily., Disp: 240 mL, Rfl: 0   hydrOXYzine (ATARAX/VISTARIL) 25 MG tablet, Take 1 tablet (25 mg total) by mouth every 6 (six) hours as needed., Disp: 16 tablet, Rfl: 0   medroxyPROGESTERone Acetate 150 MG/ML SUSY, INJECT 1 ML INTO MUSCLE EVERY 3 MONTHS, Disp: 1 mL, Rfl: 2   metroNIDAZOLE (FLAGYL) 500 MG tablet, Take 1 tablet (500 mg total) by mouth 2 (two) times daily., Disp: 14 tablet, Rfl: 0   naproxen (NAPROSYN) 500 MG tablet, Take 1 tablet (500 mg total) by mouth 2 (two) times daily with a meal., Disp: 30 tablet, Rfl: 0   triamcinolone cream (KENALOG) 0.1 %, Apply 1 application topically 2 (two) times daily., Disp: 30 g, Rfl: 0  Current Facility-Administered Medications:    medroxyPROGESTERone (DEPO-PROVERA) injection 150 mg, 150 mg, Intramuscular, Q90 days, Adam Phenix, MD, 150 mg at 10/15/19 1143   medroxyPROGESTERone (DEPO-PROVERA) injection 150 mg, 150 mg, Intramuscular, Q90 days, Johnny Bridge, MD, 150 mg at 05/23/20 1516   Medications ordered in this encounter:  Meds ordered this encounter  Medications   fluconazole (DIFLUCAN) 150 MG tablet    Sig:  Take 1 tablet (150 mg total) by mouth once for 1 dose. May repeat in 48-72 hours if needed    Dispense:  2 tablet    Refill:  0    Order Specific Question:   Supervising Provider    Answer:   MILLER, BRIAN [[id number]]   terconazole (TERAZOL 7) 0.4 % vaginal cream    Sig: Place 1 applicator vaginally at bedtime.    Dispense:  45 g    Refill:  0    Order Specific Question:   Supervising Provider    Answer:   Hyacinth Meeker, BRIAN [[id number]]     *If you need refills on other medications prior to your next appointment, please contact your pharmacy*  Follow-Up: Call back or seek an in-person evaluation if the symptoms worsen or if the condition fails to improve as anticipated.  Other Instructions See below   If you have been instructed to have an in-person evaluation today at a local Urgent Care facility, please use the link below. It will take you to a list of all of our available [facility], including address, phone number and hours of operation. Please do not delay care.  [facility]  If you or a family member do not have a primary care provider, use the link below to schedule a visit and establish care. When you choose a [facility] primary care physician or advanced practice provider, you gain a long-term partner in health. Find a Primary Care Provider  Learn  more about [facility]'s in-office and virtual care options: [facility] - Get Care Now  Vaginal Yeast Infection, Adult  Vaginal yeast infection is a condition that causes vaginal discharge as well as soreness, swelling, and redness (inflammation) of the vagina. This is a common condition. Some women get this infectionfrequently. What are the causes? This condition is caused by a change in the normal balance of the yeast (candida) and bacteria that live in the vagina. This change causes an overgrowth ofyeast, which causes the inflammation. What increases the risk? The condition is more likely to develop in women  who: Take antibiotic medicines. Have diabetes. Take birth control pills. Are pregnant. Douche often. Have a weak body defense system (immune system). Have been taking steroid medicines for a long time. Frequently wear tight clothing. What are the signs or symptoms? Symptoms of this condition include: White, thick, creamy vaginal discharge. Swelling, itching, redness, and irritation of the vagina. The lips of the vagina (vulva) may be affected as well. Pain or a burning feeling while urinating. Pain during sex. How is this diagnosed? This condition is diagnosed based on: Your medical history. A physical exam. A pelvic exam. Your health care provider will examine a sample of your vaginal discharge under a microscope. Your health care provider may send this sample for testing to confirm the diagnosis. How is this treated? This condition is treated with medicine. Medicines may be over-the-counter or prescription. You may be told to use one or more of the following: Medicine that is taken by mouth (orally). Medicine that is applied as a cream (topically). Medicine that is inserted directly into the vagina (suppository). Follow these instructions at home:  Lifestyle Do not have sex until your health care provider approves. Tell your sex partner that you have a yeast infection. That person should go to his or her health care provider and ask if they should also be treated. Do not wear tight clothes, such as pantyhose or tight pants. Wear breathable cotton underwear. General instructions Take or apply over-the-counter and prescription medicines only as told by your health care provider. Eat more yogurt. This may help to keep your yeast infection from returning. Do not use tampons until your health care provider approves. Try taking a sitz bath to help with discomfort. This is a warm water bath that is taken while you are sitting down. The water should only come up to your hips and should  cover your buttocks. Do this 3-4 times per day or as told by your health care provider. Do not douche. If you have diabetes, keep your blood sugar levels under control. Keep all follow-up visits as told by your health care provider. This is important. Contact a health care provider if: You have a fever. Your symptoms go away and then return. Your symptoms do not get better with treatment. Your symptoms get worse. You have new symptoms. You develop blisters in or around your vagina. You have blood coming from your vagina and it is not your menstrual period. You develop pain in your abdomen. Summary Vaginal yeast infection is a condition that causes discharge as well as soreness, swelling, and redness (inflammation) of the vagina. This condition is treated with medicine. Medicines may be over-the-counter or prescription. Take or apply over-the-counter and prescription medicines only as told by your health care provider. Do not douche. Do not have sex or use tampons until your health care provider approves. Contact a health care provider if your symptoms do not get better  with treatment or your symptoms go away and then return. This information is not intended to replace advice given to you by your health care provider. Make sure you discuss any questions you have with your healthcare provider. Document Revised: 08/16/2020 Document Reviewed: 01/12/2018 Elsevier Patient Education  2022 [employer].

## 2021-03-26 NOTE — Progress Notes (Signed)
Ms. lynora, Lorraine Nguyen are scheduled for a virtual visit with your provider today.    Just as we do with appointments in the office, we must obtain your consent to participate.  Your consent will be active for this visit and any virtual visit you may have with one of our providers in the next 365 days.    If you have a MyChart account, I can also send a copy of this consent to you electronically.  All virtual visits are billed to your insurance company just like a traditional visit in the office.  As this is a virtual visit, video technology does not allow for your provider to perform a traditional examination.  This may limit your provider's ability to fully assess your condition.  If your provider identifies any concerns that need to be evaluated in person or the need to arrange testing such as labs, EKG, etc, we will make arrangements to do so.    Although advances in technology are sophisticated, we cannot ensure that it will always work on either your end or our end.  If the connection with a video visit is poor, we may have to switch to a telephone visit.  With either a video or telephone visit, we are not always able to ensure that we have a secure connection.   I need to obtain your verbal consent now.   Are you willing to proceed with your visit today?   ATLEY SCARBORO has provided verbal consent on 03/26/2021 for a virtual visit (video or telephone).   Margaretann Loveless, PA-C 03/26/2021  5:17 PM  Virtual Visit Consent   [name], you are scheduled for a virtual visit with a [facility] provider today.     Just as with appointments in the office, your consent must be obtained to participate.  Your consent will be active for this visit and any virtual visit you may have with one of our providers in the next 365 days.     If you have a MyChart account, a copy of this consent can be sent to you electronically.  All virtual visits are billed to your insurance company just like a  traditional visit in the office.    As this is a virtual visit, video technology does not allow for your provider to perform a traditional examination.  This may limit your provider's ability to fully assess your condition.  If your provider identifies any concerns that need to be evaluated in person or the need to arrange testing (such as labs, EKG, etc.), we will make arrangements to do so.     Although advances in technology are sophisticated, we cannot ensure that it will always work on either your end or our end.  If the connection with a video visit is poor, the visit may have to be switched to a telephone visit.  With either a video or telephone visit, we are not always able to ensure that we have a secure connection.     I need to obtain your verbal consent now.   Are you willing to proceed with your visit today?    MACARIA BIAS has provided verbal consent on 03/26/2021 for a virtual visit (video or telephone).   Margaretann Loveless, PA-C   Date: 03/26/2021 5:17 PM   Virtual Visit via Video Note   I, Margaretann Loveless, connected with  Lorraine Nguyen  ([id number], Mar 21, 1986) on 03/26/21 at  5:00 PM EDT by a video-enabled telemedicine application  and verified that I am speaking with the correct person using two identifiers.  Location: Patient: Virtual Visit Location Patient: Other: work; isolated Provider: Engineer, mining Provider: Home Office   I discussed the limitations of evaluation and management by telemedicine and the availability of in person appointments. The patient expressed understanding and agreed to proceed.    History of Present Illness: ZYAIRAH WACHA is a 35 y.o. who identifies as a female who was assigned female at birth, and is being seen today for yeast infection.  HPI: Vaginal Discharge The patient's primary symptoms include genital itching and vaginal discharge. The patient's pertinent negatives include no genital odor. This is a new  problem. The current episode started in the past 7 days. The problem occurs constantly. The problem has been unchanged. She is not pregnant. The vaginal discharge was white and thick. The symptoms are aggravated by activity. She has tried antifungals for the symptoms. The treatment provided no relief.   Patient was recently treated for BV with metronidazole. Symptoms started after completing treatment. She has been given one dose of diflucan without symptom relief.   Problems:  Patient Active Problem List   Diagnosis Date Noted   Vaginal delivery 12/11/2016   Gestational diabetes 10/09/2016   Elevated AFP 09/24/2016   Group B streptococcal bacteriuria 05/31/2016   Supervision of high risk pregnancy, antepartum 05/26/2016    Allergies:  Allergies  Allergen Reactions   Vicodin [Hydrocodone-Acetaminophen] Itching   Medications:  Current Outpatient Medications:    fluconazole (DIFLUCAN) 150 MG tablet, Take 1 tablet (150 mg total) by mouth once for 1 dose. May repeat in 48-72 hours if needed, Disp: 2 tablet, Rfl: 0   terconazole (TERAZOL 7) 0.4 % vaginal cream, Place 1 applicator vaginally at bedtime., Disp: 45 g, Rfl: 0   chlorhexidine (PERIDEX) 0.12 % solution, Use as directed 10 mLs in the mouth or throat 2 (two) times daily., Disp: 240 mL, Rfl: 0   hydrOXYzine (ATARAX/VISTARIL) 25 MG tablet, Take 1 tablet (25 mg total) by mouth every 6 (six) hours as needed., Disp: 16 tablet, Rfl: 0   medroxyPROGESTERone Acetate 150 MG/ML SUSY, INJECT 1 ML INTO MUSCLE EVERY 3 MONTHS, Disp: 1 mL, Rfl: 2   metroNIDAZOLE (FLAGYL) 500 MG tablet, Take 1 tablet (500 mg total) by mouth 2 (two) times daily., Disp: 14 tablet, Rfl: 0   naproxen (NAPROSYN) 500 MG tablet, Take 1 tablet (500 mg total) by mouth 2 (two) times daily with a meal., Disp: 30 tablet, Rfl: 0   triamcinolone cream (KENALOG) 0.1 %, Apply 1 application topically 2 (two) times daily., Disp: 30 g, Rfl: 0  Current Facility-Administered Medications:     medroxyPROGESTERone (DEPO-PROVERA) injection 150 mg, 150 mg, Intramuscular, Q90 days, Adam Phenix, MD, 150 mg at 10/15/19 1143   medroxyPROGESTERone (DEPO-PROVERA) injection 150 mg, 150 mg, Intramuscular, Q90 days, Johnny Bridge, MD, 150 mg at 05/23/20 1516  Observations/Objective: Patient is well-developed, well-nourished in no acute distress.  Resting comfortably at work Head is normocephalic, atraumatic.  No labored breathing. Speech is clear and coherent with logical content.  Patient is alert and oriented at baseline.    Assessment and Plan: 1. Antibiotic-induced yeast infection - fluconazole (DIFLUCAN) 150 MG tablet; Take 1 tablet (150 mg total) by mouth once for 1 dose. May repeat in 48-72 hours if needed  Dispense: 2 tablet; Refill: 0 - terconazole (TERAZOL 7) 0.4 % vaginal cream; Place 1 applicator vaginally at bedtime.  Dispense: 45 g; Refill: 0 -  Failed single dose of diflucan - Will give another round and an extra tab to repeat in 72 hours if needed - Terazol cream also given for use on external tissues - Seek in person evaluation for further testing if symptoms continue  Follow Up Instructions: I discussed the assessment and treatment plan with the patient. The patient was provided an opportunity to ask questions and all were answered. The patient agreed with the plan and demonstrated an understanding of the instructions.  A copy of instructions were sent to the patient via MyChart.  The patient was advised to call back or seek an in-person evaluation if the symptoms worsen or if the condition fails to improve as anticipated.  Time:  I spent 8 minutes with the patient via telehealth technology discussing the above problems/concerns.    Margaretann Loveless, PA-C

## 2021-05-31 ENCOUNTER — Encounter: Payer: Self-pay | Admitting: Obstetrics and Gynecology

## 2021-05-31 ENCOUNTER — Ambulatory Visit (INDEPENDENT_AMBULATORY_CARE_PROVIDER_SITE_OTHER): Payer: Medicaid Other | Admitting: Obstetrics and Gynecology

## 2021-05-31 ENCOUNTER — Other Ambulatory Visit: Payer: Self-pay

## 2021-05-31 ENCOUNTER — Other Ambulatory Visit (HOSPITAL_COMMUNITY)
Admission: RE | Admit: 2021-05-31 | Discharge: 2021-05-31 | Disposition: A | Discharge status: Home / Self Care | Payer: Medicaid Other | Source: Ambulatory Visit | Attending: Obstetrics and Gynecology | Admitting: Obstetrics and Gynecology

## 2021-05-31 ENCOUNTER — Ambulatory Visit (INDEPENDENT_AMBULATORY_CARE_PROVIDER_SITE_OTHER): Payer: Medicaid Other | Admitting: Licensed Clinical Social Worker

## 2021-05-31 VITALS — BP 123/80 | HR 99 | Temp 98.5°F | Ht 74.0 in | Wt 250.8 lb

## 2021-05-31 DIAGNOSIS — Z113 Encounter for screening for infections with a predominantly sexual mode of transmission: Secondary | ICD-10-CM | POA: Diagnosis not present

## 2021-05-31 DIAGNOSIS — Z01419 Encounter for gynecological examination (general) (routine) without abnormal findings: Secondary | ICD-10-CM | POA: Diagnosis not present

## 2021-05-31 DIAGNOSIS — F439 Reaction to severe stress, unspecified: Secondary | ICD-10-CM | POA: Diagnosis not present

## 2021-05-31 DIAGNOSIS — Z124 Encounter for screening for malignant neoplasm of cervix: Secondary | ICD-10-CM | POA: Insufficient documentation

## 2021-05-31 DIAGNOSIS — Z5941 Food insecurity: Secondary | ICD-10-CM | POA: Diagnosis not present

## 2021-06-01 LAB — CERVICOVAGINAL ANCILLARY ONLY
Bacterial Vaginitis (gardnerella): POSITIVE — AB
Candida Glabrata: NEGATIVE
Candida Vaginitis: NEGATIVE
Chlamydia: NEGATIVE
Comment: NEGATIVE
Comment: NEGATIVE
Comment: NEGATIVE
Comment: NEGATIVE
Comment: NEGATIVE
Comment: NORMAL
Neisseria Gonorrhea: NEGATIVE
Trichomonas: NEGATIVE

## 2021-06-02 ENCOUNTER — Encounter: Payer: Self-pay | Admitting: Obstetrics and Gynecology

## 2021-06-02 ENCOUNTER — Other Ambulatory Visit: Payer: Self-pay | Admitting: Obstetrics and Gynecology

## 2021-06-02 DIAGNOSIS — N898 Other specified noninflammatory disorders of vagina: Secondary | ICD-10-CM

## 2021-06-02 DIAGNOSIS — B9689 Other specified bacterial agents as the cause of diseases classified elsewhere: Secondary | ICD-10-CM

## 2021-06-02 MED ORDER — METRONIDAZOLE 500 MG PO TABS: 500.0000 mg | ORAL_TABLET | Freq: Two times a day (BID) | ORAL | 0 refills | Status: DC

## 2021-06-02 NOTE — Progress Notes (Signed)
   WELL-WOMAN PHYSICAL & PAP Patient name: Lorraine Nguyen MRN [id number]  Date of birth: 05/08/1986 Chief Complaint:   Gynecologic Exam  History of Present Illness:   Lorraine Nguyen is a 35 y.o. G4P3003 African American female being seen today for a routine well-woman exam.  Current complaints: pap. She does not desire BC stating she is "not sexually active right now." Last SI was 2 months ago.  PCP: none      does not desire labs Patient's last menstrual period was 05/20/2021 (exact date). The current method of family planning is abstinence.  Last pap 01/2020. Results were:  NILM with (+) HRHPV Last mammogram: n/a. Results were:  n/a . Family h/o breast cancer: No Last colonoscopy: n/a. Results were:  n/a . Family h/o colorectal cancer: No Review of Systems:   Pertinent items are noted in HPI Denies any headaches, blurred vision, fatigue, shortness of breath, chest pain, abdominal pain, abnormal vaginal discharge/itching/odor/irritation, problems with periods, bowel movements, urination, or intercourse unless otherwise stated above. Pertinent History Reviewed:  Reviewed past medical,surgical, social and family history.  Reviewed problem list, medications and allergies. Physical Assessment:   Vitals:   05/31/21 1418 05/31/21 1436  BP: (!) 124/95 123/80  Pulse: (!) 120 99  Temp: 98.5 F (36.9 C)   TempSrc: Oral   Weight: 250 lb 12.8 oz (113.8 kg)   Height: 6\' 2"  (1.88 m)   Body mass index is 32.2 kg/m.        Physical Examination:   General appearance - well appearing, and in no distress  Mental status - alert, oriented to person, place, and time  Psych:  She has a normal mood and affect  Skin - warm and dry, normal color, no suspicious lesions noted  Chest - effort normal, all lung fields clear to auscultation bilaterally  Heart - normal rate and regular rhythm  Neck:  midline trachea, no thyromegaly or nodules  Breasts - breasts appear normal, no suspicious  masses, no skin or nipple changes or  axillary nodes  Abdomen - soft, nontender, nondistended, no masses or organomegaly  Pelvic - VULVA: normal appearing vulva with no masses, tenderness or lesions  VAGINA: normal appearing vagina with normal color and discharge, no lesions  CERVIX: normal appearing cervix without discharge or lesions, no CMT  Thin prep pap is done with HR HPV cotesting  UTERUS: uterus is felt to be normal size, shape, consistency and nontender   ADNEXA: No adnexal masses or tenderness noted.  Rectal - deferred  Extremities:  No swelling or varicosities noted  No results found for this or any previous visit (from the past 24 hour(s)).  Assessment & Plan:  1) Well-Woman Exam with Pap  2) Encounter for well woman exam with routine gynecological exam  - Cervicovaginal ancillary only,  - Cytology - PAP( [place])  3) Pap smear for cervical cancer screening  - Cervicovaginal ancillary only,  - Cytology - PAP( [place])  4) Screening examination for STD (sexually transmitted disease)  - Cervicovaginal ancillary only   Labs/procedures today: pap and STI testing  Mammogram at age 52 or sooner if problems Colonoscopy at age 48 or sooner if problems  No orders of the defined types were placed in this encounter.   Meds: No orders of the defined types were placed in this encounter.   Follow-up: Return in about 1 year (around 05/31/2022) for Annual Exam.  06/02/2022 MSN, CNM 05/31/2021 2:00 PM

## 2021-06-04 NOTE — BH Specialist Note (Signed)
Integrated Behavioral Health Initial In-Person Visit  MRN: [id number] Name: Lorraine Nguyen  Number of Integrated Behavioral Health Clinician visits:: 1/6 Session Start time: 1:06pm  Session End time: 1:30pm Total time: 24 minutes in person at [facility]   Types of Service: General Behavioral Integrated Care (BHI)  Interpretor:No. Interpretor Name and Language: none   Warm Hand Off Completed.        Subjective: Lorraine Nguyen is a 35 y.o. female accompanied by n/a Patient was referred by Lorraine Cheshire RN for food insecurity. Patient reports the following symptoms/concerns: stress  Duration of problem: approx one month; Severity of problem: mild  Objective: Mood: good  and Affect: Appropriate Risk of harm to self or others: No plan to harm self or others  Life Context: Family and Social: Lives with children in [place]  School/Work: n/a Self-Care: n/a Life Changes: n/a  Patient and/or Family's Strengths/Protective Factors: Concrete supports in place (healthy food, safe environments, etc.)  Goals Addressed: Patient will: Reduce symptoms of: stress Increase knowledge and/or ability of: healthy habits  Demonstrate ability to: Increase healthy adjustment to current life circumstances  Progress towards Goals: Ongoing  Interventions: Interventions utilized: Link to [employer]  Standardized Assessments completed: PHQ 9  Patient and/or Family Response: Lorraine Nguyen reports stress due to food insecurity. 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 LCSW Lorraine Nguyen referred pt to [employer].     Assessment: Patient currently experiencing situational stress and food insecurity.   Patient may benefit from [employer].  Plan: Follow up with behavioral health clinician on : as needed  Behavioral recommendations: n/a Referral(s): Community Resources:  Food "From scale of 1-10, how likely are you to  follow plan?":   Lorraine Saxon, LCSW

## 2021-06-06 LAB — CYTOLOGY - PAP
Comment: NEGATIVE
Comment: NEGATIVE
HPV 16: NEGATIVE
HPV 18 / 45: NEGATIVE
High risk HPV: POSITIVE — AB

## 2021-06-16 ENCOUNTER — Ambulatory Visit (INDEPENDENT_AMBULATORY_CARE_PROVIDER_SITE_OTHER): Payer: Medicaid Other

## 2021-06-16 ENCOUNTER — Ambulatory Visit (HOSPITAL_COMMUNITY): Payer: Medicaid Other

## 2021-06-16 ENCOUNTER — Other Ambulatory Visit: Payer: Self-pay

## 2021-06-16 ENCOUNTER — Ambulatory Visit (HOSPITAL_COMMUNITY)
Admission: EM | Admit: 2021-06-16 | Discharge: 2021-06-16 | Disposition: A | Discharge status: Home / Self Care | Payer: Medicaid Other | Attending: Physician Assistant | Admitting: Physician Assistant

## 2021-06-16 ENCOUNTER — Encounter (HOSPITAL_COMMUNITY): Payer: Self-pay | Admitting: Emergency Medicine

## 2021-06-16 DIAGNOSIS — R0789 Other chest pain: Secondary | ICD-10-CM | POA: Diagnosis not present

## 2021-06-16 DIAGNOSIS — S80911A Unspecified superficial injury of right knee, initial encounter: Secondary | ICD-10-CM | POA: Diagnosis not present

## 2021-06-16 DIAGNOSIS — W19XXXA Unspecified fall, initial encounter: Secondary | ICD-10-CM

## 2021-06-16 DIAGNOSIS — M25512 Pain in left shoulder: Secondary | ICD-10-CM

## 2021-06-16 DIAGNOSIS — M25571 Pain in right ankle and joints of right foot: Secondary | ICD-10-CM

## 2021-06-16 DIAGNOSIS — M7989 Other specified soft tissue disorders: Secondary | ICD-10-CM | POA: Diagnosis not present

## 2021-06-16 MED ORDER — TIZANIDINE HCL 4 MG PO CAPS: 4.0000 mg | ORAL_CAPSULE | Freq: Two times a day (BID) | ORAL | 0 refills | Status: DC | PRN

## 2021-06-16 MED ORDER — KETOROLAC TROMETHAMINE 60 MG/2ML IM SOLN: 60.0000 mg | Freq: Once | INTRAMUSCULAR | Status: DC

## 2021-06-16 MED ORDER — IBUPROFEN 800 MG PO TABS
800.0000 mg | ORAL_TABLET | Freq: Once | ORAL | Status: AC
  Administered 2021-06-16: 800 mg via ORAL

## 2021-06-16 MED ORDER — KETOROLAC TROMETHAMINE 60 MG/2ML IM SOLN
INTRAMUSCULAR | Status: AC
  Filled 2021-06-16: qty 2

## 2021-06-16 MED ORDER — DICLOFENAC SODIUM 75 MG PO TBEC: 75.0000 mg | DELAYED_RELEASE_TABLET | Freq: Two times a day (BID) | ORAL | 0 refills | Status: DC

## 2021-06-16 MED ORDER — IBUPROFEN 800 MG PO TABS
ORAL_TABLET | ORAL | Status: AC
  Filled 2021-06-16: qty 1

## 2021-06-16 NOTE — Discharge Instructions (Addendum)
Your x-ray was normal.  Please use cam boot when up and walking until symptoms improve.  Please use diclofenac twice daily for pain and inflammation.  Do not take additional NSAIDs including aspirin, ibuprofen/Advil, naproxen/Aleve due to risk of GI bleeding.  Use Zanaflex up to twice a day as needed.  This will make you drowsy do not drive or drink alcohol while taking it.  Please follow-up with orthopedics first thing next week if your symptoms do not significantly improved.  Make sure that you keep your leg elevated and use ice.  If you have any worsening symptoms please return for reevaluation.

## 2021-06-16 NOTE — ED Notes (Signed)
Informed PA that Pr refused IM injection for pain.

## 2021-06-16 NOTE — ED Provider Notes (Signed)
[facility]    CSN: [id number] Arrival date & time: 06/16/21  1139      History   Chief Complaint Chief Complaint  Patient presents with   Ankle Pain   Shoulder Pain    HPI Lorraine Nguyen is a 35 y.o. female.   Patient presents today with a 1 day history of ankle and shoulder pain following injury.  Reports that she was running in the yard when she stepped into a hole with her right foot which caused her to fall forward onto her left shoulder/anterior chest wall.  She has had ongoing pain since that time.  She reports pain is rated "1000" on a 0-10 pain scale, localized to anterior left chest wall and shoulder as well as dorsal right ankle without radiation, described as throbbing in the ankle and sharp along the chest wall, worse with movement or palpation, no alleviating factors identified.  She has tried Tylenol without improvement of symptoms.  She denies previous injury or surgery to shoulder or ankle.  She is unable to ambulate due to pain.  She is right-handed.  She is confident that she did not hit her head during fall; denies any loss of consciousness, nausea, vomiting, headache, dizziness, amnesia surrounding event.   Past Medical History:  Diagnosis Date   Gestational diabetes    glyburide   Medical history non-contributory     Patient Active Problem List   Diagnosis Date Noted   Vaginal delivery 12/11/2016   Gestational diabetes 10/09/2016   Elevated AFP 09/24/2016   Group B streptococcal bacteriuria 05/31/2016   Supervision of high risk pregnancy, antepartum 05/26/2016    Past Surgical History:  Procedure Laterality Date   NO PAST SURGERIES      OB History     Gravida  3   Para  3   Term  3   Preterm      AB      Living  3      SAB      IAB      Ectopic      Multiple  0   Live Births  3            Home Medications    Prior to Admission medications   Medication Sig Start Date End Date Taking? Authorizing  Provider  diclofenac (VOLTAREN) 75 MG EC tablet Take 1 tablet (75 mg total) by mouth 2 (two) times daily. 06/16/21  Yes Pankaj Haack K, PA-C  tiZANidine (ZANAFLEX) 4 MG capsule Take 1 capsule (4 mg total) by mouth 2 (two) times daily as needed for muscle spasms. 06/16/21  Yes Genie Mirabal K, PA-C  Multiple Vitamin (MULTIVITAMIN) tablet Take 1 tablet by mouth daily.    [provider]    Family History Family History  Problem Relation Age of Onset   Diabetes Maternal Grandmother    Hypertension Maternal Grandmother    Diabetes Paternal Grandmother    Hypertension Paternal Grandfather    Healthy Mother    Healthy Father     Social History Social History   Tobacco Use   Smoking status: Every Day    Packs/day: 0.25    Types: Cigarettes   Smokeless tobacco: Never  Vaping Use   Vaping Use: Never used  Substance Use Topics   Alcohol use: No    Comment: socially    Drug use: No     Allergies   Vicodin [hydrocodone-acetaminophen]   Review of Systems Review of Systems  Constitutional:  Positive for activity change. Negative for appetite change, fatigue and fever.  Eyes:  Negative for photophobia and visual disturbance.  Cardiovascular:  Negative for chest pain.  Gastrointestinal:  Negative for abdominal pain, diarrhea, nausea and vomiting.  Musculoskeletal:  Positive for arthralgias, gait problem, joint swelling and myalgias.  Neurological:  Negative for dizziness, weakness, light-headedness and headaches.    Physical Exam Triage Vital Signs ED Triage Vitals  Enc Vitals Group     BP 06/16/21 1301 126/86     Pulse Rate 06/16/21 1301 (!) 103     Resp 06/16/21 1301 18     Temp 06/16/21 1301 (!) 97.4 F (36.3 C)     Temp src --      SpO2 06/16/21 1301 100 %     Weight --      Height --      Head Circumference --      Peak Flow --      Pain Score 06/16/21 1300 10     Pain Loc --      Pain Edu? --      Excl. in GC? --    No data found.  Updated Vital  Signs BP 126/86   Pulse (!) 103   Temp (!) 97.4 F (36.3 C)   Resp 18   LMP 05/20/2021 (Exact Date)   SpO2 100%   Visual Acuity Right Eye Distance:   Left Eye Distance:   Bilateral Distance:    Right Eye Near:   Left Eye Near:    Bilateral Near:     Physical Exam Vitals reviewed.  Constitutional:      General: She is awake. She is not in acute distress.    Appearance: Normal appearance. She is well-developed. She is not ill-appearing.     Comments: Appears stated age no acute distress sitting in wheelchair with leg propped up on another chair.  HENT:     Head: Normocephalic and atraumatic.  Cardiovascular:     Rate and Rhythm: Normal rate and regular rhythm.     Heart sounds: Normal heart sounds, S1 normal and S2 normal. No murmur heard. Pulmonary:     Effort: Pulmonary effort is normal.     Breath sounds: Normal breath sounds. No wheezing, rhonchi or rales.     Comments: Clear to auscultation bilaterally Chest:     Chest wall: Tenderness present. No deformity or swelling.     Comments: Tenderness to palpation over left anterior chest wall Abdominal:     Palpations: Abdomen is soft.     Tenderness: There is no abdominal tenderness.  Musculoskeletal:     Left shoulder: Tenderness present. No swelling or bony tenderness. Decreased range of motion. Normal strength.     Right ankle: Swelling present. Tenderness present over the lateral malleolus and medial malleolus. Decreased range of motion.     Comments: Left shoulder: Decreased range of motion with flexion and abduction secondary to pain.  Tenderness palpation over [facility] joint as well as anterior chest wall.  No deformity noted.  Normal pincer and grip strength.  Strength 5/5 bilateral upper extremities.  Right ankle: Significant tenderness palpation at medial and lateral malleolus.  Decreased range of motion with inversion and eversion.  Psychiatric:        Behavior: Behavior is cooperative.     UC Treatments / Results   Labs (all labs ordered are listed, but only abnormal results are displayed) Labs Reviewed - No data to display  EKG   Radiology DG Ankle  Complete Right  Result Date: 06/16/2021 CLINICAL DATA:  Status post fall with right ankle pain. EXAM: RIGHT ANKLE - COMPLETE 3+ VIEW COMPARISON:  None. FINDINGS: There is no evidence of fracture, dislocation, or joint effusion. Calcification at the Achilles tendon insertion to the calcaneus is noted. Soft tissue swelling of lateral ankle is noted. IMPRESSION: No acute fracture or dislocation. Soft tissue swelling of lateral ankle is noted. Electronically Signed   By: Sherian Rein M.D.   On: 06/16/2021 14:37    Procedures Procedures (including critical care time)  Medications Ordered in UC Medications  ibuprofen (ADVIL) tablet 800 mg (800 mg Oral Given 06/16/21 1350)    Initial Impression / Assessment and Plan / UC Course  I have reviewed the triage vital signs and the nursing notes.  Pertinent labs & imaging results that were available during my care of the patient were reviewed by me and considered in my medical decision making (see chart for details).      Patient was initially offered Toradol but at the time this was being given by nursing staff she declined this and so was given 800 mg of ibuprofen in clinic to help with pain.  Ordered x-rays of right ankle as well as left shoulder and clavicle.  Patient declined left shoulder and clavicle exams with radiology tech.  X-ray of right ankle showed soft tissue swelling without acute osseous abnormality.  Patient was prescribed Voltaren 75 mg to be taken twice daily for 10 days with instruction to take additional NSAIDs due to risk of GI bleeding.  She was given tizanidine to help with muscle pain related to recent injury with instruction of drive drink alcohol while taking this.  She was placed in a boot we discussed that if symptoms or not improving quickly with conservative treatment measures she  should follow-up with orthopedics.  She was given contact information for local provider.  Discussed that she should keep foot elevated and use ice for additional symptom relief.  Discussed alarm symptoms that warrant emergent evaluation.  Strict return precautions given to which she expressed understanding.  Final Clinical Impressions(s) / UC Diagnoses   Final diagnoses:  Fall, initial encounter  Chest wall pain  Acute pain of left shoulder  Acute right ankle pain     Discharge Instructions      Your x-ray was normal.  Please use cam boot when up and walking until symptoms improve.  Please use diclofenac twice daily for pain and inflammation.  Do not take additional NSAIDs including aspirin, ibuprofen/Advil, naproxen/Aleve due to risk of GI bleeding.  Use Zanaflex up to twice a day as needed.  This will make you drowsy do not drive or drink alcohol while taking it.  Please follow-up with orthopedics first thing next week if your symptoms do not significantly improved.  Make sure that you keep your leg elevated and use ice.  If you have any worsening symptoms please return for reevaluation.     ED Prescriptions     Medication Sig Dispense Auth. Provider   diclofenac (VOLTAREN) 75 MG EC tablet Take 1 tablet (75 mg total) by mouth 2 (two) times daily. 20 tablet Feather Berrie K, PA-C   tiZANidine (ZANAFLEX) 4 MG capsule Take 1 capsule (4 mg total) by mouth 2 (two) times daily as needed for muscle spasms. 20 capsule Parminder Cupples, Noberto Retort, PA-C      PDMP not reviewed this encounter.   Jeani Hawking, PA-C 06/16/21 1502

## 2021-06-16 NOTE — ED Triage Notes (Signed)
Pt is present today with right ankle pain from  fall. Pt states that she was running and tripped over a tree stomped and twisted her ankle. Pt states that she also hurt her left shoulder from the fall.

## 2021-06-22 DIAGNOSIS — M25571 Pain in right ankle and joints of right foot: Secondary | ICD-10-CM | POA: Insufficient documentation

## 2021-07-28 ENCOUNTER — Telehealth: Payer: Medicaid Other | Admitting: Nurse Practitioner

## 2021-07-28 DIAGNOSIS — R6889 Other general symptoms and signs: Secondary | ICD-10-CM

## 2021-07-28 MED ORDER — OSELTAMIVIR PHOSPHATE 75 MG PO CAPS: 75.0000 mg | ORAL_CAPSULE | Freq: Two times a day (BID) | ORAL | 0 refills | Status: AC

## 2021-07-28 NOTE — Patient Instructions (Addendum)
  Lorraine Nguyen, thank you for joining Claiborne Rigg, NP for today's virtual visit.  While this provider is not your primary care provider (PCP), if your PCP is located in our provider database this encounter information will be shared with them immediately following your visit.  Consent: (Patient) Lorraine Nguyen provided verbal consent for this virtual visit at the beginning of the encounter.  Current Medications:  Current Outpatient Medications:    oseltamivir (TAMIFLU) 75 MG capsule, Take 1 capsule (75 mg total) by mouth 2 (two) times daily for 5 days., Disp: 10 capsule, Rfl: 0   diclofenac (VOLTAREN) 75 MG EC tablet, Take 1 tablet (75 mg total) by mouth 2 (two) times daily., Disp: 20 tablet, Rfl: 0   Multiple Vitamin (MULTIVITAMIN) tablet, Take 1 tablet by mouth daily., Disp: , Rfl:    tiZANidine (ZANAFLEX) 4 MG capsule, Take 1 capsule (4 mg total) by mouth 2 (two) times daily as needed for muscle spasms., Disp: 20 capsule, Rfl: 0  Current Facility-Administered Medications:    medroxyPROGESTERone (DEPO-PROVERA) injection 150 mg, 150 mg, Intramuscular, Q90 days, Johnny Bridge, MD, 150 mg at 05/23/20 1516   Medications ordered in this encounter:  Meds ordered this encounter  Medications   oseltamivir (TAMIFLU) 75 MG capsule    Sig: Take 1 capsule (75 mg total) by mouth 2 (two) times daily for 5 days.    Dispense:  10 capsule    Refill:  0    Order Specific Question:   Supervising Provider    Answer:   Hyacinth Meeker, BRIAN [[id number]]     *If you need refills on other medications prior to your next appointment, please contact your pharmacy*  Follow-Up: Call back or seek an in-person evaluation if the symptoms worsen or if the condition fails to improve as anticipated.  Other Instructions INSTRUCTIONS: use a humidifier for nasal congestion Drink plenty of fluids, rest and wash hands frequently to avoid the spread of infection Alternate tylenol and Motrin for relief of fever     If you have been instructed to have an in-person evaluation today at a local Urgent Care facility, please use the link below. It will take you to a list of all of our available [facility], including address, phone number and hours of operation. Please do not delay care.  [facility]  If you or a family member do not have a primary care provider, use the link below to schedule a visit and establish care. When you choose a [facility] primary care physician or advanced practice provider, you gain a long-term partner in health. Find a Primary Care Provider  Learn more about [facility]'s in-office and virtual care options: [facility] - Get Care Now

## 2021-07-28 NOTE — Progress Notes (Signed)
Virtual Visit Consent   [name], you are scheduled for a virtual visit with a [facility] provider today.     Just as with appointments in the office, your consent must be obtained to participate.  Your consent will be active for this visit and any virtual visit you may have with one of our providers in the next 365 days.     If you have a MyChart account, a copy of this consent can be sent to you electronically.  All virtual visits are billed to your insurance company just like a traditional visit in the office.    As this is a virtual visit, video technology does not allow for your provider to perform a traditional examination.  This may limit your provider's ability to fully assess your condition.  If your provider identifies any concerns that need to be evaluated in person or the need to arrange testing (such as labs, EKG, etc.), we will make arrangements to do so.     Although advances in technology are sophisticated, we cannot ensure that it will always work on either your end or our end.  If the connection with a video visit is poor, the visit may have to be switched to a telephone visit.  With either a video or telephone visit, we are not always able to ensure that we have a secure connection.     I need to obtain your verbal consent now.   Are you willing to proceed with your visit today?    JONAYA FRESHOUR has provided verbal consent on 07/28/2021 for a virtual visit (video or telephone).   Claiborne Rigg, NP   Date: 07/28/2021 2:38 PM   Virtual Visit via Video Note   I, Claiborne Rigg, connected with  Lorraine Nguyen  ([id number], 1986/02/11) on 07/28/21 at  2:30 PM EST by a video-enabled telemedicine application and verified that I am speaking with the correct person using two identifiers.  Location: Patient: Virtual Visit Location Patient: Mobile Provider: Virtual Visit Location Provider: Home Office   I discussed the limitations of evaluation and  management by telemedicine and the availability of in person appointments. The patient expressed understanding and agreed to proceed.    History of Present Illness: Lorraine Nguyen is a 35 y.o. who identifies as a female who was assigned female at birth, and is being seen today for flu like symptoms.  HPI:  States daughter has been home from school with an upper respiratory illness. Currently Ms. Kosa is experiencing Body aches, headache, sinus pressure, chills, and sore throat. Denies fever or cough. Taking dayquil and nyquil with no relief of symptoms.   Problems:  Patient Active Problem List   Diagnosis Date Noted   Vaginal delivery 12/11/2016   Gestational diabetes 10/09/2016   Elevated AFP 09/24/2016   Group B streptococcal bacteriuria 05/31/2016   Supervision of high risk pregnancy, antepartum 05/26/2016    Allergies:  Allergies  Allergen Reactions   Vicodin [Hydrocodone-Acetaminophen] Itching   Medications:  Current Outpatient Medications:    oseltamivir (TAMIFLU) 75 MG capsule, Take 1 capsule (75 mg total) by mouth 2 (two) times daily for 5 days., Disp: 10 capsule, Rfl: 0   diclofenac (VOLTAREN) 75 MG EC tablet, Take 1 tablet (75 mg total) by mouth 2 (two) times daily., Disp: 20 tablet, Rfl: 0   Multiple Vitamin (MULTIVITAMIN) tablet, Take 1 tablet by mouth daily., Disp: , Rfl:    tiZANidine (ZANAFLEX) 4 MG capsule, Take 1  capsule (4 mg total) by mouth 2 (two) times daily as needed for muscle spasms., Disp: 20 capsule, Rfl: 0  Current Facility-Administered Medications:    medroxyPROGESTERone (DEPO-PROVERA) injection 150 mg, 150 mg, Intramuscular, Q90 days, Johnny Bridge, MD, 150 mg at 05/23/20 1516  Observations/Objective: Patient is well-developed, well-nourished in no acute distress.  Resting comfortably in her car  Head is normocephalic, atraumatic.  No labored breathing.  Speech is clear and coherent with logical content.  Patient is alert and oriented  at baseline.    Assessment and Plan: 1. Flu-like symptoms - oseltamivir (TAMIFLU) 75 MG capsule; Take 1 capsule (75 mg total) by mouth 2 (two) times daily for 5 days.  Dispense: 10 capsule; Refill: 0 INSTRUCTIONS: use a humidifier for nasal congestion Drink plenty of fluids, rest and wash hands frequently to avoid the spread of infection Alternate tylenol and Motrin for relief of fever   Follow Up Instructions: I discussed the assessment and treatment plan with the patient. The patient was provided an opportunity to ask questions and all were answered. The patient agreed with the plan and demonstrated an understanding of the instructions.  A copy of instructions were sent to the patient via MyChart unless otherwise noted below.     The patient was advised to call back or seek an in-person evaluation if the symptoms worsen or if the condition fails to improve as anticipated.  Time:  I spent 10 minutes with the patient via telehealth technology discussing the above problems/concerns.    Claiborne Rigg, NP

## 2021-08-08 ENCOUNTER — Encounter: Payer: Self-pay | Admitting: Obstetrics and Gynecology

## 2021-08-27 ENCOUNTER — Telehealth: Payer: Medicaid Other | Admitting: Nurse Practitioner

## 2021-08-27 DIAGNOSIS — M62838 Other muscle spasm: Secondary | ICD-10-CM | POA: Diagnosis not present

## 2021-08-27 MED ORDER — CYCLOBENZAPRINE HCL 10 MG PO TABS
10.0000 mg | ORAL_TABLET | Freq: Every evening | ORAL | 0 refills | Status: AC | PRN
Start: 2021-08-27 — End: 2021-09-06

## 2021-08-27 MED ORDER — IBUPROFEN 600 MG PO TABS: 600.0000 mg | ORAL_TABLET | Freq: Three times a day (TID) | ORAL | 0 refills | Status: DC | PRN
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
## 2021-08-29 ENCOUNTER — Encounter: Payer: Medicaid Other | Admitting: Obstetrics and Gynecology

## 2021-09-27 ENCOUNTER — Encounter: Payer: Medicaid Other | Admitting: Obstetrics

## 2021-11-01 ENCOUNTER — Ambulatory Visit (INDEPENDENT_AMBULATORY_CARE_PROVIDER_SITE_OTHER): Payer: Medicaid Other | Admitting: Obstetrics

## 2021-11-01 ENCOUNTER — Encounter: Payer: Self-pay | Admitting: Obstetrics

## 2021-11-01 ENCOUNTER — Other Ambulatory Visit (HOSPITAL_COMMUNITY)
Admission: RE | Admit: 2021-11-01 | Discharge: 2021-11-01 | Disposition: A | Discharge status: Home / Self Care | Payer: Medicaid Other | Source: Ambulatory Visit | Attending: Obstetrics and Gynecology | Admitting: Obstetrics and Gynecology

## 2021-11-01 ENCOUNTER — Other Ambulatory Visit: Payer: Self-pay

## 2021-11-01 VITALS — BP 141/100 | HR 82 | Wt 248.8 lb

## 2021-11-01 DIAGNOSIS — R52 Pain, unspecified: Secondary | ICD-10-CM

## 2021-11-01 DIAGNOSIS — R87612 Low grade squamous intraepithelial lesion on cytologic smear of cervix (LGSIL): Secondary | ICD-10-CM | POA: Diagnosis not present

## 2021-11-01 DIAGNOSIS — R87810 Cervical high risk human papillomavirus (HPV) DNA test positive: Secondary | ICD-10-CM

## 2021-11-01 DIAGNOSIS — Z0000 Encounter for general adult medical examination without abnormal findings: Secondary | ICD-10-CM

## 2021-11-01 MED ORDER — IBUPROFEN 800 MG PO TABS: 800.0000 mg | ORAL_TABLET | Freq: Three times a day (TID) | ORAL | 5 refills | Status: DC | PRN

## 2021-11-01 NOTE — Addendum Note (Signed)
Addended by: Lewie Loron D on: 11/01/2021 10:19 AM   Modules accepted: Orders

## 2021-11-01 NOTE — Progress Notes (Signed)
Colposcopy Procedure Note  Indications: Pap smear 5 months ago showed: low-grade squamous intraepithelial neoplasia (LGSIL - encompassing HPV,mild dysplasia,CIN I). The prior pap showed no abnormalities.  Prior cervical/vaginal disease: HPV related changes. Prior cervical treatment: no treatment.  Procedure Details  The risks and benefits of the procedure and Written informed consent obtained.  A time-out was performed confirming the patient, procedure and allergy status  Speculum placed in vagina and excellent visualization of cervix achieved, cervix swabbed x 3 with acetic acid solution.  Findings: Cervix: no visible lesions, no mosaicism, no punctation, and no abnormal vasculature; SCJ visualized 360 degrees without lesions, endocervical curettage performed, cervical biopsies taken at 6 and 12 o'clock, specimen labelled and sent to pathology, and hemostasis achieved with silver nitrate.   Vaginal inspection: normal without visible lesions. Vulvar colposcopy: vulvar colposcopy not performed.   Physical Exam   Specimens: ECC and Cervical Biopsies  Complications: none.  Plan: Specimens labelled and sent to Pathology. Will base further treatment on Pathology findings. Treatment options discussed with patient. Post biopsy instructions given to patient. Return to discuss Pathology results in 2 weeks.   Shelly Bombard, MD 11/01/2021 9:36 AM

## 2021-11-01 NOTE — Addendum Note (Signed)
Addended by: Coral Ceo A on: 11/01/2021 10:22 AM   Modules accepted: Orders

## 2021-11-02 LAB — SURGICAL PATHOLOGY

## 2021-11-05 ENCOUNTER — Telehealth: Payer: Self-pay

## 2021-11-05 NOTE — Telephone Encounter (Signed)
S/w pt and advised of results and to repeat pap smear in 1 year, pt agreed.

## 2021-11-06 ENCOUNTER — Telehealth: Payer: Medicaid Other | Admitting: Physician Assistant

## 2021-11-06 ENCOUNTER — Telehealth: Payer: Medicaid Other | Admitting: Family Medicine

## 2021-11-06 DIAGNOSIS — J039 Acute tonsillitis, unspecified: Secondary | ICD-10-CM

## 2021-11-06 DIAGNOSIS — Z91199 Patient's noncompliance with other medical treatment and regimen due to unspecified reason: Secondary | ICD-10-CM

## 2021-11-06 MED ORDER — AMOXICILLIN 500 MG PO CAPS: 500.0000 mg | ORAL_CAPSULE | Freq: Two times a day (BID) | ORAL | 0 refills | Status: AC

## 2021-11-06 MED ORDER — PREDNISONE 10 MG PO TABS: ORAL_TABLET | ORAL | 0 refills | Status: AC

## 2021-11-06 NOTE — Patient Instructions (Signed)
°  Lorraine Nguyen, thank you for joining Piedad Climes, PA-C for today's virtual visit.  While this provider is not your primary care provider (PCP), if your PCP is located in our provider database this encounter information will be shared with them immediately following your visit.  Consent: (Patient) Lorraine Nguyen provided verbal consent for this virtual visit at the beginning of the encounter.  Current Medications:  Current Outpatient Medications:    diclofenac (VOLTAREN) 75 MG EC tablet, Take 1 tablet (75 mg total) by mouth 2 (two) times daily. (Patient not taking: Reported on 11/01/2021), Disp: 20 tablet, Rfl: 0   ibuprofen (ADVIL) 600 MG tablet, Take 1 tablet (600 mg total) by mouth every 8 (eight) hours as needed. Take with food, Disp: 30 tablet, Rfl: 0   ibuprofen (ADVIL) 800 MG tablet, Take 1 tablet (800 mg total) by mouth every 8 (eight) hours as needed., Disp: 30 tablet, Rfl: 5   Multiple Vitamin (MULTIVITAMIN) tablet, Take 1 tablet by mouth daily., Disp: , Rfl:   Current Facility-Administered Medications:    medroxyPROGESTERone (DEPO-PROVERA) injection 150 mg, 150 mg, Intramuscular, Q90 days, Johnny Bridge, MD, 150 mg at 05/23/20 1516   Medications ordered in this encounter:  No orders of the defined types were placed in this encounter.    *If you need refills on other medications prior to your next appointment, please contact your pharmacy*  Follow-Up: Call back or seek an in-person evaluation if the symptoms worsen or if the condition fails to improve as anticipated.  Other Instructions Please keep well-hydrated and get plenty of rest. Take antibiotic and steroid as directed with food. Tylenol OTC for breakthrough pain. Use salt-water gargles and chloraseptic spray. If anything worsens despite treatment, you need an in-person evaluation ASAP. If you note any true issue swallowing or any SOB you need an ER evaluation.      If you have been  instructed to have an in-person evaluation today at a local Urgent Care facility, please use the link below. It will take you to a list of all of our available [facility], including address, phone number and hours of operation. Please do not delay care.  [facility]  If you or a family member do not have a primary care provider, use the link below to schedule a visit and establish care. When you choose a [facility] primary care physician or advanced practice provider, you gain a long-term partner in health. Find a Primary Care Provider  Learn more about [facility]'s in-office and virtual care options: [facility] - Get Care Now

## 2021-11-06 NOTE — Progress Notes (Signed)
The patient no-showed for appointment despite this provider sending direct link, reaching out via phone with no response and waiting for at least 10 minutes from appointment time for patient to join. They will be marked as a NS for this appointment/time.   Essie Gehret M Latausha Flamm, NP    

## 2021-11-06 NOTE — Progress Notes (Signed)
Virtual Visit Consent   Lorraine Nguyen, you are scheduled for a virtual visit with a [facility] provider today.     Just as with appointments in the office, your consent must be obtained to participate.  Your consent will be active for this visit and any virtual visit you may have with one of our providers in the next 365 days.     If you have a MyChart account, a copy of this consent can be sent to you electronically.  All virtual visits are billed to your insurance company just like a traditional visit in the office.    As this is a virtual visit, video technology does not allow for your provider to perform a traditional examination.  This may limit your provider's ability to fully assess your condition.  If your provider identifies any concerns that need to be evaluated in person or the need to arrange testing (such as labs, EKG, etc.), we will make arrangements to do so.     Although advances in technology are sophisticated, we cannot ensure that it will always work on either your end or our end.  If the connection with a video visit is poor, the visit may have to be switched to a telephone visit.  With either a video or telephone visit, we are not always able to ensure that we have a secure connection.     I need to obtain your verbal consent now.   Are you willing to proceed with your visit today?    Lorraine Nguyen has provided verbal consent on 11/06/2021 for a virtual visit (video or telephone).   Piedad Climes, [place]   Date: 11/06/2021 3:07 PM   Virtual Visit via Video Note   I, Piedad Climes, connected with  Lorraine Nguyen  ([id number], 11/23/1985, age 36) on 11/06/21 at  3:00 PM EST by a video-enabled telemedicine application and verified that I am speaking with the correct person using two identifiers.  Location: Patient: Virtual Visit Location Patient: Home Provider: Virtual Visit Location Provider: Home Office   I discussed the limitations of evaluation and  management by telemedicine and the availability of in person appointments. The patient expressed understanding and agreed to proceed.    History of Present Illness: Lorraine Nguyen is a 36 y.o. who identifies as a female who was assigned female at birth, and is being seen today for severe sore throat starting yesterday, worsening this morning. Notes some hoarseness as well. Notes chills but unsure of fever. Notes some neck node tenderness. More pronounced on the R side. But pain is bilateral. Makes it hard to swallow due to pain. Denies any SOB. Denies head or chest congestion, cough or other URI symptom.  Denies recent travel or sick contact.   HPI: HPI  Problems:  Patient Active Problem List   Diagnosis Date Noted   Vaginal delivery 12/11/2016   Gestational diabetes 10/09/2016   Elevated AFP 09/24/2016   Group B streptococcal bacteriuria 05/31/2016   Supervision of high risk pregnancy, antepartum 05/26/2016    Allergies:  Allergies  Allergen Reactions   Vicodin [Hydrocodone-Acetaminophen] Itching   Medications:  Current Outpatient Medications:    amoxicillin (AMOXIL) 500 MG capsule, Take 1 capsule (500 mg total) by mouth 2 (two) times daily for 10 days., Disp: 20 capsule, Rfl: 0   predniSONE (DELTASONE) 10 MG tablet, Take 4 tablets (40 mg total) by mouth daily with breakfast for 2 days, THEN 3 tablets (30 mg total) daily with breakfast  for 2 days, THEN 2 tablets (20 mg total) daily with breakfast for 2 days, THEN 1 tablet (10 mg total) daily with breakfast for 2 days., Disp: 20 tablet, Rfl: 0   ibuprofen (ADVIL) 600 MG tablet, Take 1 tablet (600 mg total) by mouth every 8 (eight) hours as needed. Take with food, Disp: 30 tablet, Rfl: 0   ibuprofen (ADVIL) 800 MG tablet, Take 1 tablet (800 mg total) by mouth every 8 (eight) hours as needed., Disp: 30 tablet, Rfl: 5   Multiple Vitamin (MULTIVITAMIN) tablet, Take 1 tablet by mouth daily., Disp: , Rfl:   Current Facility-Administered  Medications:    medroxyPROGESTERone (DEPO-PROVERA) injection 150 mg, 150 mg, Intramuscular, Q90 days, Johnny Bridge, MD, 150 mg at 05/23/20 1516  Observations/Objective: Patient is well-developed, well-nourished in no acute distress.  Resting comfortably at home.  Head is normocephalic, atraumatic.  No labored breathing. Speech is clear and coherent with logical content.  Patient is alert and oriented at baseline.  Posterior oropharynx visualized via use of camera and good lighting. Uvula is midline. Posterior oropharyngeal erythema noted.  Bilateral tonsillar swelling noted about 2+ with white exudate noted.   Assessment and Plan: 1. Exudative tonsillitis - amoxicillin (AMOXIL) 500 MG capsule; Take 1 capsule (500 mg total) by mouth 2 (two) times daily for 10 days.  Dispense: 20 capsule; Refill: 0 - predniSONE (DELTASONE) 10 MG tablet; Take 4 tablets (40 mg total) by mouth daily with breakfast for 2 days, THEN 3 tablets (30 mg total) daily with breakfast for 2 days, THEN 2 tablets (20 mg total) daily with breakfast for 2 days, THEN 1 tablet (10 mg total) daily with breakfast for 2 days.  Dispense: 20 tablet; Refill: 0  Rx Amoxicillin.  Increase fluids.  Rest.  Saline nasal spray.  Probiotic.  Mucinex as directed.  Humidifier in bedroom. Prednisone per orders for tonsillar swelling and discomfort. OTC medications reviewed. Strict ER precautions discussed.  Call or return to clinic if symptoms are not improving.   Follow Up Instructions: I discussed the assessment and treatment plan with the patient. The patient was provided an opportunity to ask questions and all were answered. The patient agreed with the plan and demonstrated an understanding of the instructions.  A copy of instructions were sent to the patient via MyChart unless otherwise noted below.   The patient was advised to call back or seek an in-person evaluation if the symptoms worsen or if the condition fails to improve as  anticipated.  Time:  I spent 10 minutes with the patient via telehealth technology discussing the above problems/concerns.    Piedad Climes, PA-C

## 2021-11-15 ENCOUNTER — Encounter: Payer: Medicaid Other | Admitting: Internal Medicine

## 2021-12-06 DIAGNOSIS — M25571 Pain in right ankle and joints of right foot: Secondary | ICD-10-CM | POA: Diagnosis not present

## 2021-12-15 DIAGNOSIS — M25571 Pain in right ankle and joints of right foot: Secondary | ICD-10-CM | POA: Diagnosis not present

## 2021-12-20 ENCOUNTER — Telehealth: Payer: Medicaid Other | Admitting: Family Medicine

## 2021-12-20 DIAGNOSIS — N760 Acute vaginitis: Secondary | ICD-10-CM | POA: Diagnosis not present

## 2021-12-20 DIAGNOSIS — B9689 Other specified bacterial agents as the cause of diseases classified elsewhere: Secondary | ICD-10-CM | POA: Diagnosis not present

## 2021-12-20 DIAGNOSIS — M25571 Pain in right ankle and joints of right foot: Secondary | ICD-10-CM | POA: Diagnosis not present

## 2021-12-20 MED ORDER — CLINDAMYCIN PHOSPHATE 2 % VA CREA: 1.0000 | TOPICAL_CREAM | Freq: Every day | VAGINAL | Status: AC

## 2021-12-20 NOTE — Progress Notes (Signed)
?Virtual Visit Consent  ? ?Lorraine Nguyen, you are scheduled for a virtual visit with a [facility] provider today.   ?  ?Just as with appointments in the office, your consent must be obtained to participate.  Your consent will be active for this visit and any virtual visit you may have with one of our providers in the next 365 days.   ?  ?If you have a MyChart account, a copy of this consent can be sent to you electronically.  All virtual visits are billed to your insurance company just like a traditional visit in the office.   ? ?As this is a virtual visit, video technology does not allow for your provider to perform a traditional examination.  This may limit your provider's ability to fully assess your condition.  If your provider identifies any concerns that need to be evaluated in person or the need to arrange testing (such as labs, EKG, etc.), we will make arrangements to do so.   ?  ?Although advances in technology are sophisticated, we cannot ensure that it will always work on either your end or our end.  If the connection with a video visit is poor, the visit may have to be switched to a telephone visit.  With either a video or telephone visit, we are not always able to ensure that we have a secure connection.    ? ?I need to obtain your verbal consent now.   Are you willing to proceed with your visit today?  ?  ?Lorraine Nguyen has provided verbal consent on 12/20/2021 for a virtual visit (video or telephone). ?  ?Lorraine Nims, FNP  ? ?Date: 12/20/2021 6:53 PM ? ? ?Virtual Visit via Video Note  ? ?IDellia Nguyen, connected with  Lorraine Nguyen  ([id number], [birth year]) on 12/20/21 at  6:30 PM EDT by a video-enabled telemedicine application and verified that I am speaking with the correct person using two identifiers. ? ?Location: ?Patient: Virtual Visit Location Patient: Home ?Provider: Virtual Visit Location Provider: Home Office ?  ?I discussed the limitations of evaluation and management by  telemedicine and the availability of in person appointments. The patient expressed understanding and agreed to proceed.   ? ?History of Present Illness: ?Lorraine Nguyen is a 36 y.o. who identifies as a female who was assigned female at birth, and is being seen today for foul clear vaginal discharge with history of BV. She denies thick white discharge and itching. She says she has taken flagyl po in the past with GI disturbance and would like a cream. She denies abd pain and fever.  ? ?HPI: HPI  ?Problems:  ?Patient Active Problem List  ? Diagnosis Date Noted  ? Vaginal delivery 12/11/2016  ? Gestational diabetes 10/09/2016  ? Elevated AFP 09/24/2016  ? Group B streptococcal bacteriuria 05/31/2016  ? Supervision of high risk pregnancy, antepartum 05/26/2016  ?  ?Allergies:  ?Allergies  ?Allergen Reactions  ? Vicodin [Hydrocodone-Acetaminophen] Itching  ? ?Medications:  ?Current Outpatient Medications:  ?  ibuprofen (ADVIL) 600 MG tablet, Take 1 tablet (600 mg total) by mouth every 8 (eight) hours as needed. Take with food, Disp: 30 tablet, Rfl: 0 ?  ibuprofen (ADVIL) 800 MG tablet, Take 1 tablet (800 mg total) by mouth every 8 (eight) hours as needed., Disp: 30 tablet, Rfl: 5 ?  Multiple Vitamin (MULTIVITAMIN) tablet, Take 1 tablet by mouth daily., Disp: , Rfl:  ? ?Current Facility-Administered Medications:  ?  medroxyPROGESTERone (DEPO-PROVERA) injection 150  mg, 150 mg, Intramuscular, Q90 days, Cephas Darby, MD, 150 mg at 05/23/20 1516 ? ?Observations/Objective: ?Patient is well-developed, well-nourished in no acute distress.  ?Resting comfortably  at home.  ?Head is normocephalic, atraumatic.  ?No labored breathing.  ?Speech is clear and coherent with logical content.  ?Patient is alert and oriented at baseline.  ? ?Assessment and Plan: ?1. Bacterial vaginitis ? ?Medication use and side effects discussed, follow up with GYN if sx persist or worsen.  ? ?Follow Up Instructions: ?I discussed the assessment  and treatment plan with the patient. The patient was provided an opportunity to ask questions and all were answered. The patient agreed with the plan and demonstrated an understanding of the instructions.  A copy of instructions were sent to the patient via MyChart unless otherwise noted below.  ? ? ? ?The patient was advised to call back or seek an in-person evaluation if the symptoms worsen or if the condition fails to improve as anticipated. ? ?Time:  ?I spent 7 minutes with the patient via telehealth technology discussing the above problems/concerns.   ? ?Lorraine Nims, FNP  ?

## 2021-12-20 NOTE — Patient Instructions (Signed)

## 2021-12-24 MED ORDER — CLINDAMYCIN PHOSPHATE 2 % VA CREA: 1.0000 | TOPICAL_CREAM | Freq: Every day | VAGINAL | 0 refills | Status: DC

## 2021-12-24 NOTE — Addendum Note (Signed)
Addended by: Margaretann Loveless on: 12/24/2021 01:23 PM ? ? Modules accepted: Orders ? ?

## 2021-12-25 ENCOUNTER — Other Ambulatory Visit: Payer: Self-pay | Admitting: Family

## 2021-12-25 MED ORDER — CLINDESSE 2 % VA CREA: 1.0000 | TOPICAL_CREAM | Freq: Once | VAGINAL | 0 refills | Status: AC

## 2021-12-25 NOTE — Addendum Note (Signed)
Addended by: Evelina Dun A on: 12/25/2021 05:17 PM ? ? Modules accepted: Orders ? ?

## 2022-01-09 ENCOUNTER — Encounter: Payer: Self-pay | Admitting: Internal Medicine

## 2022-01-09 ENCOUNTER — Encounter: Payer: Medicaid Other | Admitting: Internal Medicine

## 2022-01-09 DIAGNOSIS — M25571 Pain in right ankle and joints of right foot: Secondary | ICD-10-CM | POA: Diagnosis not present

## 2022-01-15 ENCOUNTER — Other Ambulatory Visit: Payer: Self-pay

## 2022-01-15 ENCOUNTER — Encounter: Payer: Self-pay | Admitting: Internal Medicine

## 2022-01-15 ENCOUNTER — Ambulatory Visit (INDEPENDENT_AMBULATORY_CARE_PROVIDER_SITE_OTHER): Payer: Medicaid Other | Admitting: Internal Medicine

## 2022-01-15 VITALS — BP 137/82 | HR 93 | Temp 98.4°F | Resp 28 | Ht 74.0 in | Wt 246.5 lb

## 2022-01-15 DIAGNOSIS — F32A Depression, unspecified: Secondary | ICD-10-CM | POA: Diagnosis not present

## 2022-01-15 DIAGNOSIS — F419 Anxiety disorder, unspecified: Secondary | ICD-10-CM

## 2022-01-15 DIAGNOSIS — M545 Low back pain, unspecified: Secondary | ICD-10-CM | POA: Diagnosis present

## 2022-01-15 DIAGNOSIS — M25571 Pain in right ankle and joints of right foot: Secondary | ICD-10-CM | POA: Diagnosis not present

## 2022-01-15 DIAGNOSIS — O24415 Gestational diabetes mellitus in pregnancy, controlled by oral hypoglycemic drugs: Secondary | ICD-10-CM

## 2022-01-15 DIAGNOSIS — G8929 Other chronic pain: Secondary | ICD-10-CM

## 2022-01-15 MED ORDER — METHOCARBAMOL 500 MG PO TABS: 500.0000 mg | ORAL_TABLET | Freq: Four times a day (QID) | ORAL | 0 refills | Status: AC | PRN
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
## 2022-01-15 NOTE — Patient Instructions (Addendum)
Ms Marcey Persad, ? ?It was a pleasure seeing you in clinic. Today we discussed:  ? ?Chronic back pain: ?At this time, I have placed a physical therapy referral for this and also sent in a short prescription of muscle relaxer - robaxin - every 6 hours as needed.  ?Please let us know if no improvement.  ? ?Anxiety: ?I have placed a referral to the therapist in our office. Please schedule a visit at your earliest convenience ? ?Gestational diabetes: ?I am checking some lab work today and will call you with any abnormal results. ? ?If you have any questions or concerns, please call our clinic at [phone] between 9am-5pm and after hours call [phone] and ask for the internal medicine resident on call. If you feel you are having a medical emergency please call 911.  ? ?Thank you, we look forward to helping you remain healthy! ? ?

## 2022-01-19 DIAGNOSIS — F419 Anxiety disorder, unspecified: Secondary | ICD-10-CM | POA: Insufficient documentation

## 2022-01-19 DIAGNOSIS — G8929 Other chronic pain: Secondary | ICD-10-CM | POA: Insufficient documentation
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
## 2022-01-19 NOTE — Assessment & Plan Note (Addendum)
Patient with history of gestational diabetes. Denies polyuria or polydipsia. No recent A1c  ?Discussed obtaining basic lab work including HbA1c, BMP, lipid profile and CBC at this visit - patient reported that she does not wish to get labs today and will return to get these.  ? ?Plan: ?Future lab order placed for A1c, lipid profile, CBC and BMP ?

## 2022-01-19 NOTE — Assessment & Plan Note (Signed)
Patient reports history of anxiety and depression for which she was briefly established with a therapist. She reports ongoing symptoms of anxiety and would like to establish with therapist. ? ?Plan: ?Referral to Dr Monna Fam ?

## 2022-01-21 NOTE — Progress Notes (Signed)
Internal Medicine Clinic Attending ° °Case discussed with Dr. Aslam  At the time of the visit.  We reviewed the resident’s history and exam and pertinent patient test results.  I agree with the assessment, diagnosis, and plan of care documented in the resident’s note.  °

## 2022-01-22 DIAGNOSIS — M25571 Pain in right ankle and joints of right foot: Secondary | ICD-10-CM | POA: Diagnosis not present

## 2022-01-25 ENCOUNTER — Encounter: Payer: Self-pay | Admitting: Internal Medicine

## 2022-02-21 ENCOUNTER — Institutional Professional Consult (permissible substitution): Payer: Medicaid Other | Admitting: Behavioral Health

## 2022-02-21 ENCOUNTER — Telehealth: Payer: Self-pay | Admitting: Behavioral Health

## 2022-02-21 NOTE — Telephone Encounter (Signed)
Unable to reach Pt today for [facility] telehealth session. Cannot lv msg as mailbox is full.  Dr. Monna Fam

## 2022-04-15 ENCOUNTER — Ambulatory Visit
Admission: EM | Admit: 2022-04-15 | Discharge: 2022-04-15 | Disposition: A | Discharge status: Home / Self Care | Payer: Medicaid Other | Attending: Internal Medicine | Admitting: Internal Medicine

## 2022-04-15 ENCOUNTER — Encounter: Payer: Self-pay | Admitting: Emergency Medicine

## 2022-04-15 DIAGNOSIS — Z113 Encounter for screening for infections with a predominantly sexual mode of transmission: Secondary | ICD-10-CM | POA: Diagnosis not present

## 2022-04-15 DIAGNOSIS — R1030 Lower abdominal pain, unspecified: Secondary | ICD-10-CM | POA: Insufficient documentation

## 2022-04-15 DIAGNOSIS — N898 Other specified noninflammatory disorders of vagina: Secondary | ICD-10-CM | POA: Insufficient documentation

## 2022-04-15 LAB — POCT URINE PREGNANCY: Preg Test, Ur: NEGATIVE

## 2022-04-15 LAB — POCT URINALYSIS DIP (MANUAL ENTRY)
Bilirubin, UA: NEGATIVE
Glucose, UA: NEGATIVE mg/dL
Ketones, POC UA: NEGATIVE mg/dL
Leukocytes, UA: NEGATIVE
Nitrite, UA: NEGATIVE
Protein Ur, POC: 30 mg/dL — AB
Spec Grav, UA: >=1.03 — AB (ref 1.010–1.025)
Urobilinogen, UA: 1 E.U./dL
pH, UA: 6 (ref 5.0–8.0)

## 2022-04-15 NOTE — Discharge Instructions (Signed)
Urine pregnancy was negative.  Your urine test did not show any signs of infection.  Your vaginal swab is pending.  We will call if it is abnormal.  Please follow-up if symptoms persist or worsen.

## 2022-04-15 NOTE — ED Triage Notes (Signed)
Pt is p[present today with lower abdominal pain. Pt states her sx started x2 weeks ago.

## 2022-04-15 NOTE — ED Provider Notes (Signed)
[facility]    CSN: [id number] Arrival date & time: 04/15/22  1046      History   Chief Complaint Chief Complaint  Patient presents with   Abdominal Pain    HPI TERRIN IMPARATO is a 36 y.o. female.   Patient presents with lower abdominal cramping that has been present for about 2 weeks.  Also reports some vaginal discharge that started about 3 days ago.  Abdominal cramping is very mild in nature.  Denies any associated urinary burning, urinary frequency, abnormal vaginal bleeding, back pain, fever, pelvic pain, vaginal irritation.  Denies any confirmed exposure to STD.  Last menstrual cycle was a few weeks prior but patient is not sure of the exact date.  Denies nausea, vomiting, diarrhea.  Patient having normal bowel movements.   Abdominal Pain   Past Medical History:  Diagnosis Date   Elevated AFP 09/24/2016   Gestational diabetes    glyburide   Medical history non-contributory     Patient Active Problem List   Diagnosis Date Noted   Chronic low back pain without sciatica 01/19/2022   Anxiety and depression 01/19/2022   Gestational diabetes 10/09/2016    Past Surgical History:  Procedure Laterality Date   NO PAST SURGERIES      OB History     Gravida  3   Para  3   Term  3   Preterm      AB      Living  3      SAB      IAB      Ectopic      Multiple  0   Live Births  3            Home Medications    Prior to Admission medications   Medication Sig Start Date End Date Taking? Authorizing Provider  clindamycin (CLEOCIN) 2 % vaginal cream Place 1 Applicatorful vaginally at bedtime. 12/24/21   Margaretann Loveless, PA-C  ibuprofen (ADVIL) 600 MG tablet Take 1 tablet (600 mg total) by mouth every 8 (eight) hours as needed. Take with food 08/27/21   Viviano Simas, FNP  ibuprofen (ADVIL) 800 MG tablet Take 1 tablet (800 mg total) by mouth every 8 (eight) hours as needed. 11/01/21   Brock Bad, MD  Multiple Vitamin  (MULTIVITAMIN) tablet Take 1 tablet by mouth daily.    [provider]    Family History Family History  Problem Relation Age of Onset   Diabetes Maternal Grandmother    Hypertension Maternal Grandmother    Diabetes Paternal Grandmother    Hypertension Paternal Grandfather    Healthy Mother    Healthy Father     Social History Social History   Tobacco Use   Smoking status: Every Day    Packs/day: 0.25    Types: Cigarettes   Smokeless tobacco: Never  Vaping Use   Vaping Use: Never used  Substance Use Topics   Alcohol use: Yes    Comment: socially    Drug use: No     Allergies   Vicodin [hydrocodone-acetaminophen]   Review of Systems Review of Systems Per HPI  Physical Exam Triage Vital Signs ED Triage Vitals  Enc Vitals Group     BP 04/15/22 1117 (!) 140/88     Pulse Rate 04/15/22 1117 89     Resp 04/15/22 1117 17     Temp 04/15/22 1117 98.8 F (37.1 C)     Temp src --  SpO2 04/15/22 1117 98 %     Weight --      Height --      Head Circumference --      Peak Flow --      Pain Score 04/15/22 1116 2     Pain Loc --      Pain Edu? --      Excl. in GC? --    No data found.  Updated Vital Signs BP (!) 140/88   Pulse 89   Temp 98.8 F (37.1 C)   Resp 17   LMP 03/18/2022   SpO2 98%   Visual Acuity Right Eye Distance:   Left Eye Distance:   Bilateral Distance:    Right Eye Near:   Left Eye Near:    Bilateral Near:     Physical Exam Constitutional:      General: She is not in acute distress.    Appearance: Normal appearance. She is not toxic-appearing or diaphoretic.  HENT:     Head: Normocephalic and atraumatic.  Eyes:     Extraocular Movements: Extraocular movements intact.     Conjunctiva/sclera: Conjunctivae normal.  Cardiovascular:     Rate and Rhythm: Normal rate and regular rhythm.     Pulses: Normal pulses.     Heart sounds: Normal heart sounds.  Pulmonary:     Effort: Pulmonary effort is normal. No respiratory  distress.     Breath sounds: Normal breath sounds.  Abdominal:     General: Bowel sounds are normal. There is no distension.     Palpations: Abdomen is soft.     Tenderness: There is no abdominal tenderness.  Genitourinary:    Comments: Deferred with shared decision-making.  Self swab performed. Neurological:     General: No focal deficit present.     Mental Status: She is alert and oriented to person, place, and time. Mental status is at baseline.  Psychiatric:        Mood and Affect: Mood normal.        Behavior: Behavior normal.        Thought Content: Thought content normal.        Judgment: Judgment normal.      UC Treatments / Results  Labs (all labs ordered are listed, but only abnormal results are displayed) Labs Reviewed  POCT URINALYSIS DIP (MANUAL ENTRY) - Abnormal; Notable for the following components:      Result Value   Color, UA straw (*)    Spec Grav, UA >=1.030 (*)    Blood, UA trace-intact (*)    Protein Ur, POC =30 (*)    All other components within normal limits  POCT URINE PREGNANCY  CERVICOVAGINAL ANCILLARY ONLY    EKG   Radiology No results found.  Procedures Procedures (including critical care time)  Medications Ordered in UC Medications - No data to display  Initial Impression / Assessment and Plan / UC Course  I have reviewed the triage vital signs and the nursing notes.  Pertinent labs & imaging results that were available during my care of the patient were reviewed by me and considered in my medical decision making (see chart for details).     Urine pregnancy was negative.  Urinalysis unremarkable.  Suspect abnormalities on urinalysis are due to low p.o. intake.  Cervicovaginal swab pending.  Will await results for any further treatment.  Low concern for PID so pelvic exam was deferred.  Suggested blood work with CMP and CBC but patient declined. Risks associated  with not doing blood work were discussed with patient.  Patient voiced  understanding.  No concern for acute abdomen given physical exam so do not think emergent evaluation is necessary.  Patient was given strict return and ER precautions.  Patient verbalized understanding and was agreeable with plan. Final Clinical Impressions(s) / UC Diagnoses   Final diagnoses:  Vaginal discharge  Lower abdominal pain  Screening examination for venereal disease     Discharge Instructions      Urine pregnancy was negative.  Your urine test did not show any signs of infection.  Your vaginal swab is pending.  We will call if it is abnormal.  Please follow-up if symptoms persist or worsen.    ED Prescriptions   None    PDMP not reviewed this encounter.   Gustavus Bryant, [place] 04/15/22 [phone]

## 2022-04-16 LAB — CERVICOVAGINAL ANCILLARY ONLY
Bacterial Vaginitis (gardnerella): POSITIVE — AB
Candida Glabrata: NEGATIVE
Candida Vaginitis: NEGATIVE
Chlamydia: NEGATIVE
Comment: NEGATIVE
Comment: NEGATIVE
Comment: NEGATIVE
Comment: NEGATIVE
Comment: NEGATIVE
Comment: NORMAL
Neisseria Gonorrhea: NEGATIVE
Trichomonas: NEGATIVE

## 2022-04-17 ENCOUNTER — Telehealth (HOSPITAL_COMMUNITY): Payer: Self-pay | Admitting: Emergency Medicine

## 2022-04-17 MED ORDER — METRONIDAZOLE 500 MG PO TABS: 500.0000 mg | ORAL_TABLET | Freq: Two times a day (BID) | ORAL | 0 refills | Status: DC

## 2022-07-06 ENCOUNTER — Telehealth: Payer: Medicaid Other | Admitting: Family

## 2022-07-06 ENCOUNTER — Telehealth: Payer: Medicaid Other | Admitting: Family Medicine

## 2022-07-06 ENCOUNTER — Telehealth: Payer: Medicaid Other

## 2022-07-06 DIAGNOSIS — B9689 Other specified bacterial agents as the cause of diseases classified elsewhere: Secondary | ICD-10-CM

## 2022-07-06 DIAGNOSIS — N760 Acute vaginitis: Secondary | ICD-10-CM | POA: Diagnosis not present

## 2022-07-06 MED ORDER — CLINDAMYCIN PHOSPHATE 2 % VA CREA: 1.0000 | TOPICAL_CREAM | Freq: Every day | VAGINAL | 0 refills | Status: DC

## 2022-07-06 MED ORDER — METRONIDAZOLE 500 MG PO TABS: 500.0000 mg | ORAL_TABLET | Freq: Two times a day (BID) | ORAL | 0 refills | Status: DC

## 2022-07-06 NOTE — Progress Notes (Signed)
PT had video visit earlier, but states her medication was not at pharmacy. Will resend prescription.   Evelina Dun, FNP

## 2022-07-06 NOTE — Addendum Note (Signed)
Addended by: Evelina Dun A on: 07/06/2022 02:48 PM   Modules accepted: Orders

## 2022-07-06 NOTE — Patient Instructions (Signed)
cBacterial Vaginosis  Bacterial vaginosis is an infection that occurs when the normal balance of bacteria in the vagina changes. This change is caused by an overgrowth of certain bacteria in the vagina. Bacterial vaginosis is the most common vaginal infection among females aged 34 to 68 years. This condition increases the risk of sexually transmitted infections (STIs). Treatment can help reduce this risk. Treatment is very important for pregnant women because this condition can cause babies to be born early (prematurely) or at a low birth weight. What are the causes? This condition is caused by an increase in harmful bacteria that are normally present in small amounts in the vagina. However, the exact reason this condition develops is not known. You cannot get bacterial vaginosis from toilet seats, bedding, swimming pools, or contact with objects around you. What increases the risk? The following factors may make you more likely to develop this condition: Having a new sexual partner or multiple sexual partners, or having unprotected sex. Douching. Having an intrauterine device (IUD). Smoking. Abusing drugs and alcohol. This may lead to riskier sexual behavior. Taking certain antibiotic medicines. Being pregnant. What are the signs or symptoms? Some women with this condition have no symptoms. Symptoms may include: Pearline Cables or white vaginal discharge. The discharge can be watery or foamy. A fish-like odor with discharge, especially after sex or during menstruation. Itching in and around the vagina. Burning or pain with urination. How is this diagnosed? This condition is diagnosed based on: Your medical history. A physical exam of the vagina. Checking a sample of vaginal fluid for harmful bacteria or abnormal cells. How is this treated? This condition is treated with antibiotic medicines. These may be given as a pill, a vaginal cream, or a medicine that is put into the vagina (suppository). If  the condition comes back after treatment, a second round of antibiotics may be needed. Follow these instructions at home: Medicines Take or apply over-the-counter and prescription medicines only as told by your health care provider. Take or apply your antibiotic medicine as told by your health care provider. Do not stop using the antibiotic even if you start to feel better. General instructions If you have a female sexual partner, tell her that you have a vaginal infection. She should follow up with her health care provider. If you have a female sexual partner, he does not need treatment. Avoid sexual activity until you finish treatment. Drink enough fluid to keep your urine pale yellow. Keep the area around your vagina and rectum clean. Wash the area daily with warm water. Wipe yourself from front to back after using the toilet. If you are breastfeeding, talk to your health care provider about continuing breastfeeding during treatment. Keep all follow-up visits. This is important. How is this prevented? Self-care Do not douche. Wash the outside of your vagina with warm water only. Wear cotton or cotton-lined underwear. Avoid wearing tight pants and pantyhose, especially during the summer. Safe sex Use protection when having sex. This includes: Using condoms. Using dental dams. This is a thin layer of a material made of latex or polyurethane that protects the mouth during oral sex. Limit the number of sexual partners. To help prevent bacterial vaginosis, it is best to have sex with just one partner (monogamous relationship). Make sure you and your sexual partner are tested for STIs. Drugs and alcohol Do not use any products that contain nicotine or tobacco. These products include cigarettes, chewing tobacco, and vaping devices, such as e-cigarettes. If you need help  quitting, ask your health care provider. Do not use drugs. Do not drink alcohol if: Your health care provider tells you not  to do this. You are pregnant, may be pregnant, or are planning to become pregnant. If you drink alcohol: Limit how much you have to 0-1 drink a day. Be aware of how much alcohol is in your drink. In the [place] one drink equals one 12 oz bottle of beer (355 mL), one 5 oz glass of wine (148 mL), or one 1 oz glass of hard liquor (44 mL). Where to find more information Centers for Disease Control and Prevention: http://www.wolf.info/ American Sexual Health Association (ASHA): www.ashastd.org [place] Department of Health and Financial controller, Office on Women's Health: [place] Contact a health care provider if: Your symptoms do not improve, even after treatment. You have more discharge or pain when urinating. You have a fever or chills. You have pain in your abdomen or pelvis. You have pain during sex. You have vaginal bleeding between menstrual periods. Summary Bacterial vaginosis is a vaginal infection that occurs when the normal balance of bacteria in the vagina changes. It results from an overgrowth of certain bacteria. This condition increases the risk of sexually transmitted infections (STIs). Getting treated can help reduce this risk. Treatment is very important for pregnant women because this condition can cause babies to be born early (prematurely) or at low birth weight. This condition is treated with antibiotic medicines. These may be given as a pill, a vaginal cream, or a medicine that is put into the vagina (suppository). This information is not intended to replace advice given to you by your health care provider. Make sure you discuss any questions you have with your health care provider. Document Revised: 02/24/2020 Document Reviewed: 02/24/2020 Elsevier Patient Education  [place].

## 2022-07-06 NOTE — Progress Notes (Signed)
Virtual Visit Consent   Lorraine Nguyen, you are scheduled for a virtual visit with a [facility] provider today. Just as with appointments in the office, your consent must be obtained to participate. Your consent will be active for this visit and any virtual visit you may have with one of our providers in the next 365 days. If you have a MyChart account, a copy of this consent can be sent to you electronically.  As this is a virtual visit, video technology does not allow for your provider to perform a traditional examination. This may limit your provider's ability to fully assess your condition. If your provider identifies any concerns that need to be evaluated in person or the need to arrange testing (such as labs, EKG, etc.), we will make arrangements to do so. Although advances in technology are sophisticated, we cannot ensure that it will always work on either your end or our end. If the connection with a video visit is poor, the visit may have to be switched to a telephone visit. With either a video or telephone visit, we are not always able to ensure that we have a secure connection.  By engaging in this virtual visit, you consent to the provision of healthcare and authorize for your insurance to be billed (if applicable) for the services provided during this visit. Depending on your insurance coverage, you may receive a charge related to this service.  I need to obtain your verbal consent now. Are you willing to proceed with your visit today? [name] has provided verbal consent on 07/06/2022 for a virtual visit (video or telephone). Dellia Nims, FNP  Date: 07/06/2022 10:33 AM  Virtual Visit via Video Note   I, Dellia Nims, connected with  Lorraine Nguyen  ([id number], [birth year]) on 07/06/22 at 10:30 AM EDT by a video-enabled telemedicine application and verified that I am speaking with the correct person using two identifiers.  Location: Patient: Visit location: pt is at  work Provider: Virtual Visit Location Provider: Home Office   I discussed the limitations of evaluation and management by telemedicine and the availability of in person appointments. The patient expressed understanding and agreed to proceed.    History of Present Illness: Lorraine Nguyen is a 36 y.o. who identifies as a female who was assigned female at birth, and is being seen today for fishy odor to clear to yellow discharge vaginally with no abd pain or fever. DC is "sticky" per patient. She requests cream. Denies STD and pregnancy.   HPI: HPI  Problems:  Patient Active Problem List   Diagnosis Date Noted   Chronic low back pain without sciatica 01/19/2022   Anxiety and depression 01/19/2022   Gestational diabetes 10/09/2016    Allergies:  Allergies  Allergen Reactions   Vicodin [Hydrocodone-Acetaminophen] Itching   Medications:  Current Outpatient Medications:    clindamycin (CLEOCIN) 2 % vaginal cream, Place 1 Applicatorful vaginally at bedtime., Disp: 40 g, Rfl: 0   ibuprofen (ADVIL) 600 MG tablet, Take 1 tablet (600 mg total) by mouth every 8 (eight) hours as needed. Take with food, Disp: 30 tablet, Rfl: 0   ibuprofen (ADVIL) 800 MG tablet, Take 1 tablet (800 mg total) by mouth every 8 (eight) hours as needed., Disp: 30 tablet, Rfl: 5   metroNIDAZOLE (FLAGYL) 500 MG tablet, Take 1 tablet (500 mg total) by mouth 2 (two) times daily., Disp: 14 tablet, Rfl: 0   Multiple Vitamin (MULTIVITAMIN) tablet, Take 1 tablet by mouth daily.,  Disp: , Rfl:   Current Facility-Administered Medications:    medroxyPROGESTERone (DEPO-PROVERA) injection 150 mg, 150 mg, Intramuscular, Q90 days, Johnny Bridge, MD, 150 mg at 05/23/20 1516  Observations/Objective: Patient is well-developed, well-nourished in no acute distress.  Standing at work at [employer].   Head is normocephalic, atraumatic.  No labored breathing.  Speech is clear and coherent with logical content.  Patient is alert  and oriented at baseline.    Assessment and Plan: 1. Bacterial vaginosis  Med use and side effects discussed, follow up with Gyn or urgent care if sx worsen.   Follow Up Instructions: I discussed the assessment and treatment plan with the patient. The patient was provided an opportunity to ask questions and all were answered. The patient agreed with the plan and demonstrated an understanding of the instructions.  A copy of instructions were sent to the patient via MyChart unless otherwise noted below.     The patient was advised to call back or seek an in-person evaluation if the symptoms worsen or if the condition fails to improve as anticipated.  Time:  I spent 8 minutes with the patient via telehealth technology discussing the above problems/concerns.    Georgana Curio, FNP

## 2022-07-29 ENCOUNTER — Telehealth: Payer: Medicaid Other

## 2022-07-29 ENCOUNTER — Telehealth: Payer: Medicaid Other | Admitting: Physician Assistant

## 2022-07-29 DIAGNOSIS — T3695XA Adverse effect of unspecified systemic antibiotic, initial encounter: Secondary | ICD-10-CM | POA: Diagnosis not present

## 2022-07-29 DIAGNOSIS — B379 Candidiasis, unspecified: Secondary | ICD-10-CM

## 2022-07-29 MED ORDER — FLUCONAZOLE 150 MG PO TABS: 150.0000 mg | ORAL_TABLET | ORAL | 0 refills | Status: DC | PRN

## 2022-07-29 NOTE — Progress Notes (Signed)

## 2022-08-25 ENCOUNTER — Other Ambulatory Visit: Payer: Self-pay

## 2022-08-25 ENCOUNTER — Emergency Department (HOSPITAL_COMMUNITY): Payer: Medicaid Other

## 2022-08-25 ENCOUNTER — Emergency Department (HOSPITAL_COMMUNITY)
Admission: EM | Admit: 2022-08-25 | Discharge: 2022-08-25 | Disposition: A | Discharge status: Home / Self Care | Payer: Medicaid Other | Attending: Emergency Medicine | Admitting: Emergency Medicine

## 2022-08-25 DIAGNOSIS — Y92410 Unspecified street and highway as the place of occurrence of the external cause: Secondary | ICD-10-CM | POA: Insufficient documentation

## 2022-08-25 DIAGNOSIS — M542 Cervicalgia: Secondary | ICD-10-CM | POA: Diagnosis present

## 2022-08-25 DIAGNOSIS — S161XXA Strain of muscle, fascia and tendon at neck level, initial encounter: Secondary | ICD-10-CM

## 2022-08-25 DIAGNOSIS — S199XXA Unspecified injury of neck, initial encounter: Secondary | ICD-10-CM | POA: Diagnosis not present

## 2022-08-25 DIAGNOSIS — R519 Headache, unspecified: Secondary | ICD-10-CM | POA: Insufficient documentation

## 2022-08-25 DIAGNOSIS — S0990XA Unspecified injury of head, initial encounter: Secondary | ICD-10-CM | POA: Diagnosis not present

## 2022-08-25 MED ORDER — OXYCODONE-ACETAMINOPHEN 5-325 MG PO TABS
1.0000 | ORAL_TABLET | Freq: Once | ORAL | Status: AC
  Administered 2022-08-25: 1 via ORAL
  Filled 2022-08-25: qty 1

## 2022-08-25 MED ORDER — METHOCARBAMOL 500 MG PO TABS: 500.0000 mg | ORAL_TABLET | Freq: Two times a day (BID) | ORAL | 0 refills | Status: DC

## 2022-08-25 MED ORDER — IBUPROFEN 600 MG PO TABS: 600.0000 mg | ORAL_TABLET | Freq: Four times a day (QID) | ORAL | 0 refills | Status: DC | PRN
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
## 2022-09-15 ENCOUNTER — Telehealth: Payer: Medicaid Other | Admitting: Family Medicine

## 2022-09-15 DIAGNOSIS — K047 Periapical abscess without sinus: Secondary | ICD-10-CM | POA: Diagnosis not present

## 2022-09-15 MED ORDER — AMOXICILLIN 500 MG PO CAPS: 500.0000 mg | ORAL_CAPSULE | Freq: Three times a day (TID) | ORAL | 0 refills | Status: AC

## 2022-09-15 NOTE — Patient Instructions (Signed)
Dental Pain ?Dental pain is often a sign that something is wrong with your teeth or gums. It is also something that can occur following dental treatment. If you have dental pain, it is important to contact your dental care provider, especially if the cause of the pain has not been determined. Dental pain may be of varying intensity and can be caused by many things, including: ?Tooth decay (cavities or caries). Cavities are caused by bacteria that produce acids that irritate the nerve of your tooth, making it sensitive to air and hot or cold temperatures. This eventually causes discomfort or pain. ?Abscess or infection. Once the bacteria reach the inner part of the tooth (pulp), a bacterial infection (dental abscess) can occur. Pus typically collects at the end of the root of a tooth. ?Injury. ?A crack in the tooth. ?Gum recession exposing the root, and possibly the nerves, of a tooth. ?Gum (periodontal)disease. ?Abnormal grinding or clenching. ?Poor or improper home care. ?An unknown reason (idiopathic). ?Your pain may be mild or severe. It may occur when you are: ?Chewing. ?Exposed to hot or cold temperatures. ?Eating or drinking sugary foods or beverages, such as soda or candy. ?Your pain may be constant, or it may come and go without cause. ?Follow these instructions at home: ?The following actions may help to lessen any discomfort that you are feeling before or after getting dental care. ?Medicines ?Take over-the-counter and prescription medicines only as told by your dental care provider. ?If you were prescribed an antibiotic medicine, take it as told by your dental care provider. Do not stop taking the antibiotic even if you start to feel better. ?Eating and drinking ?Avoid foods or drinks that cause you pain, such as: ?Very hot or very cold foods or drinks. ?Sweet or sugary foods or drinks. ?Managing pain and swelling ? ?Ice can sometimes be used to reduce pain and swelling, especially if the pain is  following dental treatment. ?If directed, put ice on the painful area of your face. To do this: ?Put ice in a plastic bag. ?Place a towel between your skin and the bag. ?Leave the ice on for 20 minutes, 2-3 times a day. ?Remove the ice if your skin turns bright red. This is very important. If you cannot feel pain, heat, or cold, you have a greater risk of damage to the area. ?Brushing your teeth ?To keep your mouth and gums healthy, brush your teeth twice a day using a fluoride toothpaste. ?Use a toothpaste made for sensitive teeth as directed by your dental care provider, especially if the root is exposed. ?Always brush your teeth with a soft-bristled toothbrush. This will help prevent irritation to your gums. ?General instructions ?Floss at least once a day. ?Do not apply heat to the outside of the face. ?Gargle with a mixture of salt and water 3-4 times a day or as needed. To make salt water, completely dissolve ?-1 tsp (3-6 g) of salt in 1 cup (237 mL) of warm water. ?Keep all follow-up visits. This is important. ?Contact a dental care provider if: ?You have any unexplained dental pain. ?Your pain is not controlled with medicines. ?Your symptoms get worse. ?You have new symptoms. ?Get help right away if: ?You are unable to open your mouth. ?You are having trouble breathing or swallowing. ?You have a fever. ?You notice that your face, neck, or jaw is swollen. ?These symptoms may represent a serious problem that is an emergency. Do not wait to see if the symptoms will go   away. Get medical help right away. Call your local emergency services (911 in the [place] Do not drive yourself to the hospital. ?Summary ?Dental pain may be caused by many things, including tooth decay and infection. ?Your pain may be mild or severe. ?Take over-the-counter and prescription medicines only as told by your dental care provider. ?Watch your dental pain for any changes. Let your dental care provider know if your symptoms get  worse. ?This information is not intended to replace advice given to you by your health care provider. Make sure you discuss any questions you have with your health care provider. ?Document Revised: 05/31/2020 Document Reviewed: 05/31/2020 ?Elsevier Patient Education ? 2023 [employer] ? ?

## 2022-09-15 NOTE — Progress Notes (Signed)
Virtual Visit Consent   REES MATURA, you are scheduled for a virtual visit with a [facility] provider today. Just as with appointments in the office, your consent must be obtained to participate. Your consent will be active for this visit and any virtual visit you may have with one of our providers in the next 365 days. If you have a MyChart account, a copy of this consent can be sent to you electronically.  As this is a virtual visit, video technology does not allow for your provider to perform a traditional examination. This may limit your provider's ability to fully assess your condition. If your provider identifies any concerns that need to be evaluated in person or the need to arrange testing (such as labs, EKG, etc.), we will make arrangements to do so. Although advances in technology are sophisticated, we cannot ensure that it will always work on either your end or our end. If the connection with a video visit is poor, the visit may have to be switched to a telephone visit. With either a video or telephone visit, we are not always able to ensure that we have a secure connection.  By engaging in this virtual visit, you consent to the provision of healthcare and authorize for your insurance to be billed (if applicable) for the services provided during this visit. Depending on your insurance coverage, you may receive a charge related to this service.  I need to obtain your verbal consent now. Are you willing to proceed with your visit today? Lorraine Nguyen has provided verbal consent on 09/15/2022 for a virtual visit (video or telephone). Dellia Nims, FNP  Date: 09/15/2022 11:54 AM  Virtual Visit via Video Note   I, Dellia Nims, connected with  Lorraine Nguyen  ([id number], 23-Oct-1985) on 09/15/22 at 12:00 PM EST by a video-enabled telemedicine application and verified that I am speaking with the correct person using two identifiers.  Location: Patient: Virtual Visit Location  Patient: Home Provider: Virtual Visit Location Provider: Home Office   I discussed the limitations of evaluation and management by telemedicine and the availability of in person appointments. The patient expressed understanding and agreed to proceed.    History of Present Illness: Lorraine Nguyen is a 37 y.o. who identifies as a female who was assigned female at birth, and is being seen today for rt upper dental pain. She has an apptmt with the dentist but not until end of month. No fever. Marland Kitchen  HPI: HPI  Problems:  Patient Active Problem List   Diagnosis Date Noted   Chronic low back pain without sciatica 01/19/2022   Anxiety and depression 01/19/2022   Gestational diabetes 10/09/2016    Allergies:  Allergies  Allergen Reactions   Vicodin [Hydrocodone-Acetaminophen] Itching   Medications:  Current Outpatient Medications:    amoxicillin (AMOXIL) 500 MG capsule, Take 1 capsule (500 mg total) by mouth 3 (three) times daily for 10 days., Disp: 30 capsule, Rfl: 0   fluconazole (DIFLUCAN) 150 MG tablet, Take 1 tablet (150 mg total) by mouth every 3 (three) days as needed., Disp: 2 tablet, Rfl: 0   ibuprofen (ADVIL) 600 MG tablet, Take 1 tablet (600 mg total) by mouth every 6 (six) hours as needed., Disp: 30 tablet, Rfl: 0   methocarbamol (ROBAXIN) 500 MG tablet, Take 1 tablet (500 mg total) by mouth 2 (two) times daily., Disp: 20 tablet, Rfl: 0   Multiple Vitamin (MULTIVITAMIN) tablet, Take 1 tablet by mouth daily., Disp: , Rfl:  Current Facility-Administered Medications:    medroxyPROGESTERone (DEPO-PROVERA) injection 150 mg, 150 mg, Intramuscular, Q90 days, Johnny Bridge, MD, 150 mg at 05/23/20 1516  Observations/Objective: Patient is well-developed, well-nourished in no acute distress.  Resting comfortably  at home.  Head is normocephalic, atraumatic.  No labored breathing.  Speech is clear and coherent with logical content.  Patient is alert and oriented at baseline.     Assessment and Plan: 1. Dental abscess  Heat, ibuprofen as directed, urgent care if sx worsen.   Follow Up Instructions: I discussed the assessment and treatment plan with the patient. The patient was provided an opportunity to ask questions and all were answered. The patient agreed with the plan and demonstrated an understanding of the instructions.  A copy of instructions were sent to the patient via MyChart unless otherwise noted below.     The patient was advised to call back or seek an in-person evaluation if the symptoms worsen or if the condition fails to improve as anticipated.  Time:  I spent 8 minutes with the patient via telehealth technology discussing the above problems/concerns.    Georgana Curio, FNP

## 2022-09-16 ENCOUNTER — Ambulatory Visit (HOSPITAL_COMMUNITY): Payer: Medicaid Other

## 2022-09-30 ENCOUNTER — Telehealth: Payer: Medicaid Other | Admitting: Nurse Practitioner

## 2022-09-30 ENCOUNTER — Ambulatory Visit (HOSPITAL_COMMUNITY): Payer: Self-pay

## 2022-09-30 DIAGNOSIS — J069 Acute upper respiratory infection, unspecified: Secondary | ICD-10-CM

## 2022-09-30 NOTE — Progress Notes (Signed)
Virtual Visit Consent   Lorraine Nguyen, you are scheduled for a virtual visit with a [facility] provider today. Just as with appointments in the office, your consent must be obtained to participate. Your consent will be active for this visit and any virtual visit you may have with one of our providers in the next 365 days. If you have a MyChart account, a copy of this consent can be sent to you electronically.  As this is a virtual visit, video technology does not allow for your provider to perform a traditional examination. This may limit your provider's ability to fully assess your condition. If your provider identifies any concerns that need to be evaluated in person or the need to arrange testing (such as labs, EKG, etc.), we will make arrangements to do so. Although advances in technology are sophisticated, we cannot ensure that it will always work on either your end or our end. If the connection with a video visit is poor, the visit may have to be switched to a telephone visit. With either a video or telephone visit, we are not always able to ensure that we have a secure connection.  By engaging in this virtual visit, you consent to the provision of healthcare and authorize for your insurance to be billed (if applicable) for the services provided during this visit. Depending on your insurance coverage, you may receive a charge related to this service.  I need to obtain your verbal consent now. Are you willing to proceed with your visit today? Lorraine Nguyen has provided verbal consent on 09/30/2022 for a virtual visit (video or telephone). Apolonio Schneiders, FNP  Date: 09/30/2022 8:21 AM  Virtual Visit via Video Note   I, Apolonio Schneiders, connected with  Lorraine Nguyen  ([id number], Jun 21, 1986) on 09/30/22 at  8:30 AM EST by a video-enabled telemedicine application and verified that I am speaking with the correct person using two identifiers.  Location: Patient: Virtual Visit Location  Patient: Home Provider: Virtual Visit Location Provider: Home Office   I discussed the limitations of evaluation and management by telemedicine and the availability of in person appointments. The patient expressed understanding and agreed to proceed.    History of Present Illness: Lorraine Nguyen is a 37 y.o. who identifies as a female who was assigned female at birth, and is being seen today for cough and congestion with pressure to the top of her nose with slight headache.  Symptom onset was 1-2 days ago.   She was able to take a home COVID test that was negative  Denies fever   Denies a history of asthma   Denies contact with anyone known to have the flu   She has been managing symptoms with Nyquil.   Her worst symptom today is her sinus pressure and her cough  Her cough is productive   She was on Amoxicillin for 10 days from 09/15/22- 09/25/22 for dental infection    Problems:  Patient Active Problem List   Diagnosis Date Noted   Chronic low back pain without sciatica 01/19/2022   Anxiety and depression 01/19/2022   Gestational diabetes 10/09/2016    Allergies:  Allergies  Allergen Reactions   Vicodin [Hydrocodone-Acetaminophen] Itching   Medications:  Current Outpatient Medications:    fluconazole (DIFLUCAN) 150 MG tablet, Take 1 tablet (150 mg total) by mouth every 3 (three) days as needed., Disp: 2 tablet, Rfl: 0   ibuprofen (ADVIL) 600 MG tablet, Take 1 tablet (600 mg total) by  mouth every 6 (six) hours as needed., Disp: 30 tablet, Rfl: 0   methocarbamol (ROBAXIN) 500 MG tablet, Take 1 tablet (500 mg total) by mouth 2 (two) times daily., Disp: 20 tablet, Rfl: 0   Multiple Vitamin (MULTIVITAMIN) tablet, Take 1 tablet by mouth daily., Disp: , Rfl:   Current Facility-Administered Medications:    medroxyPROGESTERone (DEPO-PROVERA) injection 150 mg, 150 mg, Intramuscular, Q90 days, Cephas Darby, MD, 150 mg at 05/23/20 1516  Observations/Objective: Patient is  well-developed, well-nourished in no acute distress.  Resting comfortably  at home.  Head is normocephalic, atraumatic.  No labored breathing.  Speech is clear and coherent with logical content.  Patient is alert and oriented at baseline.    Assessment and Plan: 1. Viral upper respiratory tract infection Discussed over the counter medications for symptom management Mucinex or Dayquil/Nyquil  Push fluids  Rest  May return to work if she remains negative for COVID and without a fever  Follow up for new or worsening symptoms as discussed       Follow Up Instructions: I discussed the assessment and treatment plan with the patient. The patient was provided an opportunity to ask questions and all were answered. The patient agreed with the plan and demonstrated an understanding of the instructions.  A copy of instructions were sent to the patient via MyChart unless otherwise noted below.    The patient was advised to call back or seek an in-person evaluation if the symptoms worsen or if the condition fails to improve as anticipated.  Time:  I spent 10 minutes with the patient via telehealth technology discussing the above problems/concerns.    Apolonio Schneiders, FNP

## 2022-10-16 ENCOUNTER — Ambulatory Visit (HOSPITAL_COMMUNITY)
Admission: RE | Admit: 2022-10-16 | Discharge: 2022-10-16 | Disposition: A | Discharge status: Home / Self Care | Payer: Medicaid Other | Source: Ambulatory Visit | Attending: Family Medicine | Admitting: Family Medicine

## 2022-10-16 VITALS — BP 143/90 | HR 84 | Temp 98.3°F | Resp 19

## 2022-10-16 DIAGNOSIS — M5441 Lumbago with sciatica, right side: Secondary | ICD-10-CM | POA: Diagnosis not present

## 2022-10-16 LAB — POCT URINALYSIS DIPSTICK, ED / UC
Bilirubin Urine: NEGATIVE
Glucose, UA: NEGATIVE mg/dL
Ketones, ur: NEGATIVE mg/dL
Nitrite: NEGATIVE
Protein, ur: NEGATIVE mg/dL
Specific Gravity, Urine: 1.025 (ref 1.005–1.030)
Urobilinogen, UA: 1 mg/dL (ref 0.0–1.0)
pH: 6.5 (ref 5.0–8.0)

## 2022-10-16 LAB — POC URINE PREG, ED: Preg Test, Ur: NEGATIVE

## 2022-10-16 MED ORDER — PREDNISONE 20 MG PO TABS: 40.0000 mg | ORAL_TABLET | Freq: Every day | ORAL | 0 refills | Status: AC

## 2022-10-16 MED ORDER — KETOROLAC TROMETHAMINE 30 MG/ML IJ SOLN
30.0000 mg | Freq: Once | INTRAMUSCULAR | Status: AC
  Administered 2022-10-16: 30 mg via INTRAMUSCULAR

## 2022-10-16 MED ORDER — KETOROLAC TROMETHAMINE 30 MG/ML IJ SOLN
INTRAMUSCULAR | Status: AC
  Filled 2022-10-16: qty 1

## 2022-10-16 MED ORDER — ACETAMINOPHEN 325 MG PO TABS
975.0000 mg | ORAL_TABLET | Freq: Once | ORAL | Status: AC
  Administered 2022-10-16: 975 mg via ORAL

## 2022-10-16 MED ORDER — METHOCARBAMOL 500 MG PO TABS: 500.0000 mg | ORAL_TABLET | Freq: Two times a day (BID) | ORAL | 0 refills | Status: DC

## 2022-10-16 MED ORDER — ACETAMINOPHEN 325 MG PO TABS
ORAL_TABLET | ORAL | Status: AC
  Filled 2022-10-16: qty 3

## 2022-10-16 NOTE — ED Triage Notes (Signed)
Pt presents to uc with co of back pain for 1 day. Lower back 8/10 that comes and goes. Pt has been taking rx pain medication naproxen that has not been helping. No dysuria.

## 2022-10-16 NOTE — Discharge Instructions (Addendum)
Your pain is likely due to sciatic nerve pain.   - Take prednisone 40mg  daily for the next 5 days every morning for inflammation. Do not take any other NSAID containing medicine when taking prednisone.   - I gave you a shot called ketorolac in the clinic to help with inflammation as well. - You may also take the prescribed muscle relaxer as directed as needed for muscle aches/spasm.  Do not take this medication and drive or drink alcohol as it can make you sleepy.  Mainly use this medicine at nighttime as needed. - Apply heat 20 minutes on then 20 minutes off and perform gentle range of motion exercises to the area of greatest pain to prevent muscle stiffness and provide further pain relief.   Schedule an appointment with the orthopedic provider listed on your paperwork for ongoing evaluation and follow-up.   Red flag symptoms to watch out for are numbness/tingling to the legs, weakness, loss of bowel/bladder control, and/or worsening pain that does not respond well to medicines. Follow-up with your primary care provider or return to urgent care if your symptoms do not improve in the next 3 to 4 days with medications and interventions recommended today. If your symptoms are severe (red flag), please go to the emergency room.  I hope you feel better!

## 2022-10-16 NOTE — ED Provider Notes (Signed)
[facility]    CSN: [id number] Arrival date & time: 10/16/22  1252      History   Chief Complaint Chief Complaint  Patient presents with   Back Pain    Serious back pain can barely move - Entered by patient    HPI Lorraine Nguyen is a 37 y.o. female.   Patient presents to urgent care for evaluation of right-sided low back pain that started yesterday with radicular discomfort and pain to the right lower extremity.  Patient has a history of chronic low back pain without sciatica.  Last episode of sciatic nerve pain was in December 2023.  She denies recent steroid/antibiotic use.  Denies urinary symptoms, loss of urine/bowel continence, saddle anesthesia symptoms, constipation, diarrhea, abdominal pain, and vaginal symptoms.  No recent falls, injuries, or trauma to the low back.  No history of surgical procedures to the spine or previous injuries to the spine.  She is not experiencing any midline low back discomfort and states it starts to the right side of the lumbar spine then radiates down the right leg to the right posterior thigh.  Denies numbness and tingling to the bilateral lower extremities.  She took naproxen yesterday but states this has not helped with pain at all.  Sitting and movement makes pain worse.  She is most comfortable standing up.   Back Pain   Past Medical History:  Diagnosis Date   Elevated AFP 09/24/2016   Gestational diabetes    glyburide   Medical history non-contributory     Patient Active Problem List   Diagnosis Date Noted   Chronic low back pain without sciatica 01/19/2022   Anxiety and depression 01/19/2022   Pain in joint of right ankle 06/22/2021   Gestational diabetes 10/09/2016    Past Surgical History:  Procedure Laterality Date   NO PAST SURGERIES      OB History     Gravida  3   Para  3   Term  3   Preterm      AB      Living  3      SAB      IAB      Ectopic      Multiple  0   Live Births  3             Home Medications    Prior to Admission medications   Medication Sig Start Date End Date Taking? Authorizing Provider  methocarbamol (ROBAXIN) 500 MG tablet Take 1 tablet (500 mg total) by mouth 2 (two) times daily. 10/16/22  Yes Talbot Grumbling, FNP  predniSONE (DELTASONE) 20 MG tablet Take 2 tablets (40 mg total) by mouth daily for 5 days. 10/16/22 10/21/22 Yes Talbot Grumbling, FNP  fluconazole (DIFLUCAN) 150 MG tablet Take 1 tablet (150 mg total) by mouth every 3 (three) days as needed. 07/29/22   Mar Daring, PA-C  ibuprofen (ADVIL) 600 MG tablet Take 1 tablet (600 mg total) by mouth every 6 (six) hours as needed. 08/25/22   Lacretia Leigh, MD  Multiple Vitamin (MULTIVITAMIN) tablet Take 1 tablet by mouth daily.    [provider]    Family History Family History  Problem Relation Age of Onset   Diabetes Maternal Grandmother    Hypertension Maternal Grandmother    Diabetes Paternal Grandmother    Hypertension Paternal Grandfather    Healthy Mother    Healthy Father     Social History Social History  Tobacco Use   Smoking status: Every Day    Packs/day: 0.25    Types: Cigarettes   Smokeless tobacco: Never  Vaping Use   Vaping Use: Never used  Substance Use Topics   Alcohol use: Yes    Comment: socially    Drug use: No     Allergies   Vicodin [hydrocodone-acetaminophen]   Review of Systems Review of Systems  Musculoskeletal:  Positive for back pain.  Per HPI   Physical Exam Triage Vital Signs ED Triage Vitals  Enc Vitals Group     BP 10/16/22 1329 (!) 143/90     Pulse Rate 10/16/22 1327 84     Resp 10/16/22 1327 19     Temp 10/16/22 1327 98.3 F (36.8 C)     Temp Source 10/16/22 1327 Oral     SpO2 10/16/22 1327 98 %     Weight --      Height --      Head Circumference --      Peak Flow --      Pain Score 10/16/22 1326 8     Pain Loc --      Pain Edu? --      Excl. in [place]? --    No data found.  Updated Vital  Signs BP (!) 143/90   Pulse 84   Temp 98.3 F (36.8 C) (Oral)   Resp 19   LMP 09/28/2022   SpO2 98%   Visual Acuity Right Eye Distance:   Left Eye Distance:   Bilateral Distance:    Right Eye Near:   Left Eye Near:    Bilateral Near:     Physical Exam Vitals and nursing note reviewed.  Constitutional:      Appearance: She is not ill-appearing or toxic-appearing.  HENT:     Head: Normocephalic and atraumatic.     Right Ear: Hearing and external ear normal.     Left Ear: Hearing and external ear normal.     Nose: Nose normal.     Mouth/Throat:     Lips: Pink.     Mouth: Mucous membranes are moist.     Pharynx: No posterior oropharyngeal erythema.  Eyes:     General: Lids are normal. Vision grossly intact. Gaze aligned appropriately.     Extraocular Movements: Extraocular movements intact.     Conjunctiva/sclera: Conjunctivae normal.  Cardiovascular:     Rate and Rhythm: Normal rate and regular rhythm.     Heart sounds: Normal heart sounds, S1 normal and S2 normal.  Pulmonary:     Effort: Pulmonary effort is normal. No respiratory distress.     Breath sounds: Normal breath sounds and air entry.  Abdominal:     General: Abdomen is flat. Bowel sounds are normal.     Palpations: Abdomen is soft.     Tenderness: There is no abdominal tenderness.  Musculoskeletal:     Cervical back: Normal and neck supple.     Thoracic back: Normal.     Lumbar back: Tenderness present. No swelling, edema, deformity, signs of trauma, lacerations, spasms or bony tenderness. Decreased range of motion. No scoliosis.     Comments: Significant decreased range of motion of the lumbar spine.  TTP to the right sided paraspinals of the lumbar spine.  No deformity.  Palpation to the right sciatic notch does not elicit radiculopathy down the right leg.  Nontender to the midline C, T, and L-spine.  No tenderness elicited to palpation of the left paraspinals.  Lymphadenopathy:     Cervical: No cervical  adenopathy.  Skin:    General: Skin is warm and dry.     Capillary Refill: Capillary refill takes less than 2 seconds.     Findings: No rash.  Neurological:     General: No focal deficit present.     Mental Status: She is alert and oriented to person, place, and time. Mental status is at baseline.     Cranial Nerves: Cranial nerves 2-12 are intact. No dysarthria or facial asymmetry.     Sensory: Sensation is intact.     Motor: Motor function is intact.     Coordination: Coordination is intact.     Gait: Gait is intact.     Comments: Nonfocal neuroexam.  Patient moves all 4 extremities with normal coordination voluntarily.  Strength and sensation intact bilateral lower extremities.  Psychiatric:        Mood and Affect: Mood normal.        Speech: Speech normal.        Behavior: Behavior normal.        Thought Content: Thought content normal.        Judgment: Judgment normal.      UC Treatments / Results  Labs (all labs ordered are listed, but only abnormal results are displayed) Labs Reviewed  POCT URINALYSIS DIPSTICK, ED / UC - Abnormal; Notable for the following components:      Result Value   Hgb urine dipstick TRACE (*)    Leukocytes,Ua TRACE (*)    All other components within normal limits  POC URINE PREG, ED    EKG   Radiology No results found.  Procedures Procedures (including critical care time)  Medications Ordered in UC Medications  ketorolac (TORADOL) 30 MG/ML injection 30 mg (30 mg Intramuscular Given 10/16/22 1413)  acetaminophen (TYLENOL) tablet 975 mg (975 mg Oral Given 10/16/22 1412)    Initial Impression / Assessment and Plan / UC Course  I have reviewed the triage vital signs and the nursing notes.  Pertinent labs & imaging results that were available during my care of the patient were reviewed by me and considered in my medical decision making (see chart for details).   1. Acute right-sided low back pain with right-sided sciatica Presentation  consistent with acute sciatic nerve pain. Deferred imaging based on stable musculoskeletal exam findings and hemodynamically stable vital signs. Neurologic exam is stable and without focal findings. No red flag signs/symptoms found on exam indicating need for referral to ED for further workup.   Pain has not responded well to attempted use of NSAIDs, therefore we will provide steroid antiinflammatory to provide comfort and reduce inflammation. Ketorolac IM 30mg  and tylenol 975mg  given in clinic for acute pain. May start steroid tomorrow and take as directed with food. No NSAID while taking steroid due to increased risk of GI bleeding. Muscle relaxer provided for relief of muscle spasm involvement. Drowsiness precautions discussed regarding muscle relaxer use. Heat and gentle ROM exercises recommended. Walking referral to orthopedics provided should symptoms fail to improve in the next 3-4 days with use of above interventions. Discussed red flag signs/symptoms and strict ER/UC return precautions.  Discussed physical exam and available lab work findings in clinic with patient.  Counseled patient regarding appropriate use of medications and potential side effects for all medications recommended or prescribed today. Discussed red flag signs and symptoms of worsening condition,when to call the PCP office, return to urgent care, and when to seek higher level of  care in the emergency department. Patient verbalizes understanding and agreement with plan. All questions answered. Patient discharged in stable condition.    Final Clinical Impressions(s) / UC Diagnoses   Final diagnoses:  Acute right-sided low back pain with right-sided sciatica     Discharge Instructions      Your pain is likely due to sciatic nerve pain.   - Take prednisone 40mg  daily for the next 5 days every morning for inflammation. Do not take any other NSAID containing medicine when taking prednisone.   - I gave you a shot called  ketorolac in the clinic to help with inflammation as well. - You may also take the prescribed muscle relaxer as directed as needed for muscle aches/spasm.  Do not take this medication and drive or drink alcohol as it can make you sleepy.  Mainly use this medicine at nighttime as needed. - Apply heat 20 minutes on then 20 minutes off and perform gentle range of motion exercises to the area of greatest pain to prevent muscle stiffness and provide further pain relief.   Schedule an appointment with the orthopedic provider listed on your paperwork for ongoing evaluation and follow-up.   Red flag symptoms to watch out for are numbness/tingling to the legs, weakness, loss of bowel/bladder control, and/or worsening pain that does not respond well to medicines. Follow-up with your primary care provider or return to urgent care if your symptoms do not improve in the next 3 to 4 days with medications and interventions recommended today. If your symptoms are severe (red flag), please go to the emergency room.  I hope you feel better!       ED Prescriptions     Medication Sig Dispense Auth. Provider   predniSONE (DELTASONE) 20 MG tablet Take 2 tablets (40 mg total) by mouth daily for 5 days. 10 tablet , FNP   methocarbamol (ROBAXIN) 500 MG tablet Take 1 tablet (500 mg total) by mouth 2 (two) times daily. 20 tablet Carlisle Beers, FNP      PDMP not reviewed this encounter.   Carlisle Beers, Carlisle Beers 10/16/22 1425

## 2022-10-17 DIAGNOSIS — M545 Low back pain, unspecified: Secondary | ICD-10-CM | POA: Diagnosis not present

## 2022-11-08 ENCOUNTER — Telehealth: Payer: Medicaid Other | Admitting: Nurse Practitioner

## 2022-11-08 DIAGNOSIS — B3731 Acute candidiasis of vulva and vagina: Secondary | ICD-10-CM | POA: Diagnosis not present

## 2022-11-08 MED ORDER — FLUCONAZOLE 150 MG PO TABS: 150.0000 mg | ORAL_TABLET | Freq: Once | ORAL | 0 refills | Status: AC

## 2022-11-08 NOTE — Progress Notes (Signed)
Virtual Visit Consent   Lorraine Nguyen, you are scheduled for a virtual visit with a [facility] provider today. Just as with appointments in the office, your consent must be obtained to participate. Your consent will be active for this visit and any virtual visit you may have with one of our providers in the next 365 days. If you have a MyChart account, a copy of this consent can be sent to you electronically.  As this is a virtual visit, video technology does not allow for your provider to perform a traditional examination. This may limit your provider's ability to fully assess your condition. If your provider identifies any concerns that need to be evaluated in person or the need to arrange testing (such as labs, EKG, etc.), we will make arrangements to do so. Although advances in technology are sophisticated, we cannot ensure that it will always work on either your end or our end. If the connection with a video visit is poor, the visit may have to be switched to a telephone visit. With either a video or telephone visit, we are not always able to ensure that we have a secure connection.  By engaging in this virtual visit, you consent to the provision of healthcare and authorize for your insurance to be billed (if applicable) for the services provided during this visit. Depending on your insurance coverage, you may receive a charge related to this service.  I need to obtain your verbal consent now. Are you willing to proceed with your visit today? Lorraine Nguyen has provided verbal consent on 11/08/2022 for a virtual visit (video or telephone). Apolonio Schneiders, FNP  Date: 11/08/2022 2:59 PM  Virtual Visit via Video Note   I, Apolonio Schneiders, connected with  Lorraine Nguyen  (1959, [birth year]) on 11/08/22 at  3:00 PM EST by a video-enabled telemedicine application and verified that I am speaking with the correct person using two identifiers.  Location: Patient: Virtual Visit Location  Patient: Home Provider: Virtual Visit Location Provider: Home Office   I discussed the limitations of evaluation and management by telemedicine and the availability of in person appointments. The patient expressed understanding and agreed to proceed.    History of Present Illness: Lorraine Nguyen is a 37 y.o. who identifies as a female who was assigned female at birth, and is being seen today for yeast infection.  She was recently on amoxicillin for a dental infection and this caused her symptoms   She is having vaginal symptoms of itching and discharge     Problems:  Patient Active Problem List   Diagnosis Date Noted   Chronic low back pain without sciatica 01/19/2022   Anxiety and depression 01/19/2022   Pain in joint of right ankle 06/22/2021   Gestational diabetes 10/09/2016    Allergies:  Allergies  Allergen Reactions   Vicodin [Hydrocodone-Acetaminophen] Itching   Medications:  Current Outpatient Medications:    fluconazole (DIFLUCAN) 150 MG tablet, Take 1 tablet (150 mg total) by mouth every 3 (three) days as needed., Disp: 2 tablet, Rfl: 0   ibuprofen (ADVIL) 600 MG tablet, Take 1 tablet (600 mg total) by mouth every 6 (six) hours as needed., Disp: 30 tablet, Rfl: 0   methocarbamol (ROBAXIN) 500 MG tablet, Take 1 tablet (500 mg total) by mouth 2 (two) times daily., Disp: 20 tablet, Rfl: 0   Multiple Vitamin (MULTIVITAMIN) tablet, Take 1 tablet by mouth daily., Disp: , Rfl:   Current Facility-Administered Medications:    medroxyPROGESTERone (  DEPO-PROVERA) injection 150 mg, 150 mg, Intramuscular, Q90 days, Cephas Darby, MD, 150 mg at 05/23/20 1516  Observations/Objective: Patient is well-developed, well-nourished in no acute distress.  Resting comfortably  at home.  Head is normocephalic, atraumatic.  No labored breathing.  Speech is clear and coherent with logical content.  Patient is alert and oriented at baseline.    Assessment and Plan: 1. Vaginal  yeast infection  - fluconazole (DIFLUCAN) 150 MG tablet; Take 1 tablet (150 mg total) by mouth once for 1 dose. May repeat x1 after 72 hours if needed  Dispense: 2 tablet; Refill: 0     Follow Up Instructions: I discussed the assessment and treatment plan with the patient. The patient was provided an opportunity to ask questions and all were answered. The patient agreed with the plan and demonstrated an understanding of the instructions.  A copy of instructions were sent to the patient via MyChart unless otherwise noted below.    The patient was advised to call back or seek an in-person evaluation if the symptoms worsen or if the condition fails to improve as anticipated.  Time:  I spent 7 minutes with the patient via telehealth technology discussing the above problems/concerns.    Apolonio Schneiders, FNP
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
## 2022-11-11 ENCOUNTER — Ambulatory Visit: Payer: Medicaid Other | Attending: Physician Assistant

## 2022-11-11 ENCOUNTER — Other Ambulatory Visit: Payer: Self-pay

## 2022-11-11 DIAGNOSIS — M6281 Muscle weakness (generalized): Secondary | ICD-10-CM | POA: Insufficient documentation

## 2022-11-11 DIAGNOSIS — M5416 Radiculopathy, lumbar region: Secondary | ICD-10-CM | POA: Diagnosis not present

## 2022-11-11 DIAGNOSIS — M5459 Other low back pain: Secondary | ICD-10-CM | POA: Diagnosis not present

## 2022-11-21 DIAGNOSIS — M545 Low back pain, unspecified: Secondary | ICD-10-CM | POA: Diagnosis not present

## 2022-11-24 NOTE — Therapy (Signed)
OUTPATIENT PHYSICAL THERAPY TREATMENT NOTE   Patient Name: Lorraine Nguyen MRN: [id number] DOB:07/28/1986, 37 y.o., female Today's Date: 11/25/2022  PCP: Ariwodo, Shirley, MD  REFERRING PROVIDER: Josh Chadwell PA-C   END OF SESSION:   PT End of Session - 11/25/22 1609     Visit Number 2    Number of Visits 13    Date for PT Re-Evaluation 01/06/23    Authorization Type [employer]    PT Start Time 1615    PT Stop Time 1655    PT Time Calculation (min) 40 min    Activity Tolerance Patient tolerated treatment well;Patient limited by pain    Behavior During Therapy WFL for tasks assessed/performed             Past Medical History:  Diagnosis Date   Elevated AFP 09/24/2016   Gestational diabetes    glyburide   Medical history non-contributory    Past Surgical History:  Procedure Laterality Date   NO PAST SURGERIES     Patient Active Problem List   Diagnosis Date Noted   Chronic low back pain without sciatica 01/19/2022   Anxiety and depression 01/19/2022   Pain in joint of right ankle 06/22/2021   Gestational diabetes 10/09/2016    REFERRING DIAG: RIGHT SIDED LOW BACK PAIN, RADICULAPATHY   THERAPY DIAG:  Other low back pain  Radiculopathy, lumbar region  Muscle weakness (generalized)  Rationale for Evaluation and Treatment Rehabilitation  PERTINENT HISTORY: Patient presents to urgent care for evaluation of right-sided low back pain that started yesterday with radicular discomfort and pain to the right lower extremity. Patient has a history of chronic low back pain without sciatica. Last episode of sciatic nerve pain was in December 2023. She denies recent steroid/antibiotic use. Denies urinary symptoms, loss of urine/bowel continence, saddle anesthesia symptoms, constipation, diarrhea, abdominal pain, and vaginal symptoms. No recent falls, injuries, or trauma to the low back. No history of surgical procedures to the spine or previous injuries to the spine. She is  not experiencing any midline low back discomfort and states it starts to the right side of the lumbar spine then radiates down the right leg to the right posterior thigh. Denies numbness and tingling to the bilateral lower extremities. She took naproxen yesterday but states this has not helped with pain at all. Sitting and movement makes pain worse. She is most comfortable standing up.   PRECAUTIONS: None   SUBJECTIVE:                                                                                                                                                                                      SUBJECTIVE   STATEMENT:  Reporting an increase in low back pain.  Saw MD last week and will undergo MRI ASAP   PAIN:  Are you having pain? Yes: NPRS scale: 8/10 Pain location: low back Pain description: ache Aggravating factors: activity Relieving factors: heat   OBJECTIVE: (objective measures completed at initial evaluation unless otherwise dated)   DIAGNOSTIC FINDINGS:  none   PATIENT SURVEYS:  Modified Oswestry 25/50 50% perceived disability    SCREENING FOR RED FLAGS: negative     MUSCLE LENGTH: Hamstrings: Right 50 deg; Left 80 deg   POSTURE: increased lumbar lordosis   PALPATION: TTP L5-3 spinous processes and R paraspinals    LUMBAR ROM:    AROM eval  Flexion 50%  Extension 90%  Right lateral flexion 75%  Left lateral flexion 75%  Right rotation 50%  Left rotation 50%   (Blank rows = not tested)   LOWER EXTREMITY ROM:   WNL throughout   Active  Right eval Left eval  Hip flexion      Hip extension      Hip abduction      Hip adduction      Hip internal rotation      Hip external rotation      Knee flexion      Knee extension      Ankle dorsiflexion      Ankle plantarflexion      Ankle inversion      Ankle eversion       (Blank rows = not tested)   LOWER EXTREMITY MMT:     MMT Right eval Left eval  Hip flexion 4- 4-  Hip extension 4- 4-  Hip  abduction 4- 4-  Hip adduction      Hip internal rotation      Hip external rotation      Knee flexion 4- 4-  Knee extension 4- 4-  Ankle dorsiflexion      Ankle plantarflexion 4- 4-  Ankle inversion      Ankle eversion       (Blank rows = not tested)   LUMBAR SPECIAL TESTS:  Straight leg raise test: Negative, Slump test: Negative, Single leg stance test: Negative, FABER test: Negative, and Thomas test: PKB negative   FUNCTIONAL TESTS:   30 seconds chair stand test 3 reps arms crossed   GAIT: Distance walked: 75ft x2 Assistive device utilized: None Level of assistance: Complete Independence     TODAY'S TREATMENT:       [facility] Adult PT Treatment:                                                DATE: 11/25/22 Therapeutic Exercise: Nustep L2 6 min (to tolerance) Seated hamstring stretch 30s x2 Bil Supine QL stretch 30s x2 Bil Supine hip fallouts YTB 15x B, 15/15 unilaterally  PPT 3s hold 10x Supine alternating OH flexion 15/15 Open book 10/10                                                                                                                             DATE: 11/11/22 Eval      PATIENT EDUCATION:  Education details: Discussed eval findings, rehab rationale and POC and patient is in agreement  Person educated: Patient Education method: Explanation Education comprehension: verbalized understanding and needs further education   HOME EXERCISE PROGRAM: Access Code: [email or web address] URL: [email or web address] Date: 11/11/2022 Prepared by: Ahren Pettinger   Exercises - Curl Up with Arms Crossed  - 2 x daily - 5 x weekly - 2 sets - 15 reps - Hooklying Single Knee to Chest Stretch  - 2 x daily - 5 x weekly - 1 sets - 2 reps - 30s hold - Supine Quadratus Lumborum Stretch  - 2 x daily - 5 x weekly - 1 sets - 2 reps - 30s hold   ASSESSMENT:   CLINICAL IMPRESSION: Patient returns for first f/u session noting and increase in low back pain w/o aggravating factors.   Had a return visit with ortho on 3/14 and an MRI has been ordered for 3/29.  Today, focus was stretching tasks with core strengthening.  Able to complete all tasks with increased symptoms during tasks, returning to baseline at rest.  Symptoms appear soft tissue in nature, compounded by core and LE weakness.     EVAL: Patient is a 36 y.o. female who was seen today for physical therapy evaluation and treatment for chronic R sided low back pain.  Symptoms are chronic at this time.  No neural irritation was noted, R hamstring tightness identified, ROM restrictions noted in trunk flexion and rotation promarily.  Marked LE weakness noted by 30s chair stand test with diminished core and trunk strength observed as well.  Patient presents with chronic lumbar sprain/strain.   OBJECTIVE IMPAIRMENTS: decreased activity tolerance, decreased knowledge of condition, difficulty walking, decreased ROM, decreased strength, impaired perceived functional ability, increased muscle spasms, improper body mechanics, postural dysfunction, and pain.    ACTIVITY LIMITATIONS: carrying, lifting, bending, standing, and sleeping   PERSONAL FACTORS: Age, Past/current experiences, and Time since onset of injury/illness/exacerbation are also affecting patient's functional outcome.    REHAB POTENTIAL: Good   CLINICAL DECISION MAKING: Stable/uncomplicated   EVALUATION COMPLEXITY: Low     GOALS: Goals reviewed with patient? No   SHORT TERM GOALS: Target date: 12/02/2022     Patient to demonstrate independence in HEP Baseline: [email or web address] Goal status: INITIAL   2.  Increase forward flexion to 75% Baseline: 50% Goal status: INITIAL     LONG TERM GOALS: Target date: 12/23/2022   Decrease ODI score to 17/50 (34% perceived disability) Baseline: 25/50 Goal status: INITIAL   2.  Increase 30s chair stand test to 6 reps arms crossed Baseline: 3 reps arms crossed Goal status: INITIAL   3.  Decrease pain to 8/10 Baseline:  "20"/10 Goal status: INITIAL   4.  Increase all trunk ROM to 75% Baseline:  AROM eval  Flexion 50%  Extension 90%  Right lateral flexion 75%  Left lateral flexion 75%  Right rotation 50%  Left rotation 50%    Goal status: INITIAL   5.  Increase BLE strength to 4/5 Baseline:  MMT Right eval Left eval  Hip flexion 4- 4-  Hip extension 4- 4-  Hip abduction 4- 4-  Hip adduction      Hip internal rotation      Hip external rotation      Knee flexion 4- 4-  Knee extension 4- 4-  Ankle dorsiflexion      Ankle plantarflexion 4- 4-    Goal status: INITIAL         PLAN:   PT FREQUENCY: 2x/week   PT DURATION: 6 weeks   PLANNED INTERVENTIONS: Therapeutic exercises, Therapeutic activity, Neuromuscular re-education, Balance training, Gait training, Patient/Family education, Self Care, Joint mobilization, Dry Needling, Electrical stimulation, Spinal mobilization, Cryotherapy, Moist heat, Manual therapy, and Re-evaluation.   PLAN FOR NEXT SESSION: HEP review and update, trunk and core strengthening, ROM and flexibility exercises   Daniela Siebers M Kareemah Grounds, PT 11/25/2022, 5:00 PM     

## 2022-11-25 ENCOUNTER — Ambulatory Visit: Payer: Medicaid Other

## 2022-11-25 DIAGNOSIS — M5459 Other low back pain: Secondary | ICD-10-CM

## 2022-11-25 DIAGNOSIS — M6281 Muscle weakness (generalized): Secondary | ICD-10-CM

## 2022-11-25 DIAGNOSIS — M5416 Radiculopathy, lumbar region: Secondary | ICD-10-CM

## 2022-11-28 ENCOUNTER — Ambulatory Visit: Payer: Medicaid Other

## 2022-12-02 ENCOUNTER — Ambulatory Visit: Payer: Medicaid Other

## 2022-12-02 ENCOUNTER — Telehealth: Payer: Self-pay

## 2022-12-02 NOTE — Therapy (Deleted)
OUTPATIENT PHYSICAL THERAPY TREATMENT NOTE   Patient Name: Lorraine Nguyen MRN: [id number] DOB:Aug 21, 1986, 37 y.o., female Today's Date: 11/25/2022  PCP: Timothy Lasso, MD  REFERRING PROVIDER: Baldo Ash PA-C   END OF SESSION:   PT End of Session - 11/25/22 1609     Visit Number 2    Number of Visits 13    Date for PT Re-Evaluation 01/06/23    Authorization Type  MCD    PT Start Time 1615    PT Stop Time 1655    PT Time Calculation (min) 40 min    Activity Tolerance Patient tolerated treatment well;Patient limited by pain    Behavior During Therapy [facility] for tasks assessed/performed             Past Medical History:  Diagnosis Date   Elevated AFP 09/24/2016   Gestational diabetes    glyburide   Medical history non-contributory    Past Surgical History:  Procedure Laterality Date   NO PAST SURGERIES     Patient Active Problem List   Diagnosis Date Noted   Chronic low back pain without sciatica 01/19/2022   Anxiety and depression 01/19/2022   Pain in joint of right ankle 06/22/2021   Gestational diabetes 10/09/2016    REFERRING DIAG: RIGHT SIDED LOW BACK PAIN, RADICULAPATHY   THERAPY DIAG:  Other low back pain  Radiculopathy, lumbar region  Muscle weakness (generalized)  Rationale for Evaluation and Treatment Rehabilitation  PERTINENT HISTORY: Patient presents to urgent care for evaluation of right-sided low back pain that started yesterday with radicular discomfort and pain to the right lower extremity. Patient has a history of chronic low back pain without sciatica. Last episode of sciatic nerve pain was in December 2023. She denies recent steroid/antibiotic use. Denies urinary symptoms, loss of urine/bowel continence, saddle anesthesia symptoms, constipation, diarrhea, abdominal pain, and vaginal symptoms. No recent falls, injuries, or trauma to the low back. No history of surgical procedures to the spine or previous injuries to the spine. She is  not experiencing any midline low back discomfort and states it starts to the right side of the lumbar spine then radiates down the right leg to the right posterior thigh. Denies numbness and tingling to the bilateral lower extremities. She took naproxen yesterday but states this has not helped with pain at all. Sitting and movement makes pain worse. She is most comfortable standing up.   PRECAUTIONS: None   SUBJECTIVE:                                                                                                                                                                                      SUBJECTIVE  STATEMENT:  Reporting an increase in low back pain.  Saw MD last week and will undergo MRI ASAP   PAIN:  Are you having pain? Yes: NPRS scale: 8/10 Pain location: low back Pain description: ache Aggravating factors: activity Relieving factors: heat   OBJECTIVE: (objective measures completed at initial evaluation unless otherwise dated)   DIAGNOSTIC FINDINGS:  none   PATIENT SURVEYS:  Modified Oswestry 25/50 50% perceived disability    SCREENING FOR RED FLAGS: negative     MUSCLE LENGTH: Hamstrings: Right 50 deg; Left 80 deg   POSTURE: increased lumbar lordosis   PALPATION: TTP L5-3 spinous processes and R paraspinals    LUMBAR ROM:    AROM eval  Flexion 50%  Extension 90%  Right lateral flexion 75%  Left lateral flexion 75%  Right rotation 50%  Left rotation 50%   (Blank rows = not tested)   LOWER EXTREMITY ROM:   WNL throughout   Active  Right eval Left eval  Hip flexion      Hip extension      Hip abduction      Hip adduction      Hip internal rotation      Hip external rotation      Knee flexion      Knee extension      Ankle dorsiflexion      Ankle plantarflexion      Ankle inversion      Ankle eversion       (Blank rows = not tested)   LOWER EXTREMITY MMT:     MMT Right eval Left eval  Hip flexion 4- 4-  Hip extension 4- 4-  Hip  abduction 4- 4-  Hip adduction      Hip internal rotation      Hip external rotation      Knee flexion 4- 4-  Knee extension 4- 4-  Ankle dorsiflexion      Ankle plantarflexion 4- 4-  Ankle inversion      Ankle eversion       (Blank rows = not tested)   LUMBAR SPECIAL TESTS:  Straight leg raise test: Negative, Slump test: Negative, Single leg stance test: Negative, FABER test: Negative, and Thomas test: PKB negative   FUNCTIONAL TESTS:   30 seconds chair stand test 3 reps arms crossed   GAIT: Distance walked: 54ft x2 Assistive device utilized: None Level of assistance: Complete Independence     TODAY'S TREATMENT:       [facility] Adult PT Treatment:                                                DATE: 11/25/22 Therapeutic Exercise: Nustep L2 6 min (to tolerance) Seated hamstring stretch 30s x2 Bil Supine QL stretch 30s x2 Bil Supine hip fallouts YTB 15x B, 15/15 unilaterally  PPT 3s hold 10x Supine alternating OH flexion 15/15 Open book 10/10  DATE: 11/11/22 Eval      PATIENT EDUCATION:  Education details: Discussed eval findings, rehab rationale and POC and patient is in agreement  Person educated: Patient Education method: Explanation Education comprehension: verbalized understanding and needs further education   HOME EXERCISE PROGRAM: Access Code: [email or web address] URL: [email or web address] Date: 11/11/2022 Prepared by: Sharlynn Oliphant   Exercises - Curl Up with Arms Crossed  - 2 x daily - 5 x weekly - 2 sets - 15 reps - Hooklying Single Knee to Chest Stretch  - 2 x daily - 5 x weekly - 1 sets - 2 reps - 30s hold - Supine Quadratus Lumborum Stretch  - 2 x daily - 5 x weekly - 1 sets - 2 reps - 30s hold   ASSESSMENT:   CLINICAL IMPRESSION: Patient returns for first f/u session noting and increase in low back pain w/o aggravating factors.   Had a return visit with ortho on 3/14 and an MRI has been ordered for 3/29.  Today, focus was stretching tasks with core strengthening.  Able to complete all tasks with increased symptoms during tasks, returning to baseline at rest.  Symptoms appear soft tissue in nature, compounded by core and LE weakness.     EVAL: Patient is a 37 y.o. female who was seen today for physical therapy evaluation and treatment for chronic R sided low back pain.  Symptoms are chronic at this time.  No neural irritation was noted, R hamstring tightness identified, ROM restrictions noted in trunk flexion and rotation promarily.  Marked LE weakness noted by 30s chair stand test with diminished core and trunk strength observed as well.  Patient presents with chronic lumbar sprain/strain.   OBJECTIVE IMPAIRMENTS: decreased activity tolerance, decreased knowledge of condition, difficulty walking, decreased ROM, decreased strength, impaired perceived functional ability, increased muscle spasms, improper body mechanics, postural dysfunction, and pain.    ACTIVITY LIMITATIONS: carrying, lifting, bending, standing, and sleeping   PERSONAL FACTORS: Age, Past/current experiences, and Time since onset of injury/illness/exacerbation are also affecting patient's functional outcome.    REHAB POTENTIAL: Good   CLINICAL DECISION MAKING: Stable/uncomplicated   EVALUATION COMPLEXITY: Low     GOALS: Goals reviewed with patient? No   SHORT TERM GOALS: Target date: 12/02/2022     Patient to demonstrate independence in HEP Baseline: [id number] Goal status: INITIAL   2.  Increase forward flexion to 75% Baseline: 50% Goal status: INITIAL     LONG TERM GOALS: Target date: 12/23/2022   Decrease ODI score to 17/50 (34% perceived disability) Baseline: 25/50 Goal status: INITIAL   2.  Increase 30s chair stand test to 6 reps arms crossed Baseline: 3 reps arms crossed Goal status: INITIAL   3.  Decrease pain to 8/10 Baseline:  "20"/10 Goal status: INITIAL   4.  Increase all trunk ROM to 75% Baseline:  AROM eval  Flexion 50%  Extension 90%  Right lateral flexion 75%  Left lateral flexion 75%  Right rotation 50%  Left rotation 50%    Goal status: INITIAL   5.  Increase BLE strength to 4/5 Baseline:  MMT Right eval Left eval  Hip flexion 4- 4-  Hip extension 4- 4-  Hip abduction 4- 4-  Hip adduction      Hip internal rotation      Hip external rotation      Knee flexion 4- 4-  Knee extension 4- 4-  Ankle dorsiflexion      Ankle plantarflexion 4- 4-    Goal status: INITIAL  PLAN:   PT FREQUENCY: 2x/week   PT DURATION: 6 weeks   PLANNED INTERVENTIONS: Therapeutic exercises, Therapeutic activity, Neuromuscular re-education, Balance training, Gait training, Patient/Family education, Self Care, Joint mobilization, Dry Needling, Electrical stimulation, Spinal mobilization, Cryotherapy, Moist heat, Manual therapy, and Re-evaluation.   PLAN FOR NEXT SESSION: HEP review and update, trunk and core strengthening, ROM and flexibility exercises   Lanice Shirts, PT 11/25/2022, 5:00 PM

## 2022-12-02 NOTE — Telephone Encounter (Signed)
TC due to missed visit.  Spoke directly to patient who had confused appointment times.  Reminded her of next scheduled visit.

## 2022-12-05 ENCOUNTER — Ambulatory Visit: Payer: Medicaid Other

## 2022-12-05 DIAGNOSIS — M5459 Other low back pain: Secondary | ICD-10-CM

## 2022-12-05 DIAGNOSIS — M6281 Muscle weakness (generalized): Secondary | ICD-10-CM

## 2022-12-05 DIAGNOSIS — M5416 Radiculopathy, lumbar region: Secondary | ICD-10-CM

## 2022-12-05 NOTE — Therapy (Addendum)
OUTPATIENT PHYSICAL THERAPY TREATMENT NOTE   Patient Name: Lorraine Nguyen MRN: [id number] DOB:12-Jul-1986, 37 y.o., female Today's Date: 12/05/2022  PCP: Dellis Filbert, MD  REFERRING PROVIDER: Estanislado Spire PA-C  PHYSICAL THERAPY DISCHARGE SUMMARY  Visits from Start of Care: 3  Current functional level related to goals / functional outcomes: UTA   Remaining deficits: UTA   Education / Equipment: HEP   Patient agrees to discharge. Patient goals were partially met. Patient is being discharged due to not returning since the last visit.  END OF SESSION:   PT End of Session - 12/05/22 1206     Visit Number 3    Number of Visits 13    Date for PT Re-Evaluation 01/06/23    Authorization Type [employer]    PT Start Time 1215    PT Stop Time 1256    PT Time Calculation (min) 41 min    Activity Tolerance Patient tolerated treatment well    Behavior During Therapy WFL for tasks assessed/performed              Past Medical History:  Diagnosis Date   Elevated AFP 09/24/2016   Gestational diabetes    glyburide   Medical history non-contributory    Past Surgical History:  Procedure Laterality Date   NO PAST SURGERIES     Patient Active Problem List   Diagnosis Date Noted   Chronic low back pain without sciatica 01/19/2022   Anxiety and depression 01/19/2022   Pain in joint of right ankle 06/22/2021   Gestational diabetes 10/09/2016    REFERRING DIAG: RIGHT SIDED LOW BACK PAIN, RADICULAPATHY   THERAPY DIAG:  Other low back pain  Radiculopathy, lumbar region  Muscle weakness (generalized)  Rationale for Evaluation and Treatment Rehabilitation  PERTINENT HISTORY: Patient presents to urgent care for evaluation of right-sided low back pain that started yesterday with radicular discomfort and pain to the right lower extremity. Patient has a history of chronic low back pain without sciatica. Last episode of sciatic nerve pain was in December 2023. She denies  recent steroid/antibiotic use. Denies urinary symptoms, loss of urine/bowel continence, saddle anesthesia symptoms, constipation, diarrhea, abdominal pain, and vaginal symptoms. No recent falls, injuries, or trauma to the low back. No history of surgical procedures to the spine or previous injuries to the spine. She is not experiencing any midline low back discomfort and states it starts to the right side of the lumbar spine then radiates down the right leg to the right posterior thigh. Denies numbness and tingling to the bilateral lower extremities. She took naproxen yesterday but states this has not helped with pain at all. Sitting and movement makes pain worse. She is most comfortable standing up.   PRECAUTIONS: None   SUBJECTIVE:  SUBJECTIVE STATEMENT:  Patient reports decreased pain today, states she has an MRI scheduled for tomorrow.    PAIN:  Are you having pain? Yes: NPRS scale: 3/10 Pain location: low back Pain description: ache Aggravating factors: activity Relieving factors: heat   OBJECTIVE: (objective measures completed at initial evaluation unless otherwise dated)   DIAGNOSTIC FINDINGS:  none   PATIENT SURVEYS:  Modified Oswestry 25/50 50% perceived disability    SCREENING FOR RED FLAGS: negative     MUSCLE LENGTH: Hamstrings: Right 50 deg; Left 80 deg   POSTURE: increased lumbar lordosis   PALPATION: TTP L5-3 spinous processes and R paraspinals    LUMBAR ROM:    AROM eval  Flexion 50%  Extension 90%  Right lateral flexion 75%  Left lateral flexion 75%  Right rotation 50%  Left rotation 50%   (Blank rows = not tested)   LOWER EXTREMITY ROM:   WNL throughout   Active  Right eval Left eval  Hip flexion      Hip extension      Hip abduction      Hip adduction      Hip  internal rotation      Hip external rotation      Knee flexion      Knee extension      Ankle dorsiflexion      Ankle plantarflexion      Ankle inversion      Ankle eversion       (Blank rows = not tested)   LOWER EXTREMITY MMT:     MMT Right eval Left eval  Hip flexion 4- 4-  Hip extension 4- 4-  Hip abduction 4- 4-  Hip adduction      Hip internal rotation      Hip external rotation      Knee flexion 4- 4-  Knee extension 4- 4-  Ankle dorsiflexion      Ankle plantarflexion 4- 4-  Ankle inversion      Ankle eversion       (Blank rows = not tested)   LUMBAR SPECIAL TESTS:  Straight leg raise test: Negative, Slump test: Negative, Single leg stance test: Negative, FABER test: Negative, and Thomas test: PKB negative   FUNCTIONAL TESTS:   30 seconds chair stand test 3 reps arms crossed   GAIT: Distance walked: 25ft x2 Assistive device utilized: None Level of assistance: Complete Independence     TODAY'S TREATMENT:       [facility] Adult PT Treatment:                                                DATE: 12/05/22 Therapeutic Exercise: Nustep L6 6 min Standing hip extension/abduction RTB at ankles 2x10 BIL  Seated hamstring stretch 30s x2 Bil SLR x10 BIL - cues for abdominal activation  LTR x10 BIL Supine QL stretch 30s x2 Bil PPT 3s hold 10x Bridge 2x10 Open book 10/10 Seated pball roll outs x10 each fwd/lat  [facility] Adult PT Treatment:                                                DATE: 11/25/22 Therapeutic Exercise: Nustep L2 6 min (to tolerance) Seated hamstring stretch 30s x2  Bil Supine QL stretch 30s x2 Bil Supine hip fallouts YTB 15x B, 15/15 unilaterally  PPT 3s hold 10x Supine alternating OH flexion 15/15 Open book 10/10                                                                                                                           DATE: 11/11/22 Eval      PATIENT EDUCATION:  Education details: Discussed eval findings, rehab rationale and POC and  patient is in agreement  Person educated: Patient Education method: Explanation Education comprehension: verbalized understanding and needs further education   HOME EXERCISE PROGRAM: Access Code: [email or web address] URL: [email or web address] Date: 11/11/2022 Prepared by: Gustavus Bryant   Exercises - Curl Up with Arms Crossed  - 2 x daily - 5 x weekly - 2 sets - 15 reps - Hooklying Single Knee to Chest Stretch  - 2 x daily - 5 x weekly - 1 sets - 2 reps - 30s hold - Supine Quadratus Lumborum Stretch  - 2 x daily - 5 x weekly - 1 sets - 2 reps - 30s hold   ASSESSMENT:   CLINICAL IMPRESSION:  Patient presents to PT reporting decreased overall pain and HEP compliance. She states she also has an MRI scheduled for tomorrow. Session today continued to focus on proximal hip and core strengthening with incorporation of standing exercises today to good effect. Patient was able to tolerate all prescribed exercises with no adverse effects. Patient continues to benefit from skilled PT services and should be progressed as able to improve functional independence.    EVAL: Patient is a 37 y.o. female who was seen today for physical therapy evaluation and treatment for chronic R sided low back pain.  Symptoms are chronic at this time.  No neural irritation was noted, R hamstring tightness identified, ROM restrictions noted in trunk flexion and rotation promarily.  Marked LE weakness noted by 30s chair stand test with diminished core and trunk strength observed as well.  Patient presents with chronic lumbar sprain/strain.   OBJECTIVE IMPAIRMENTS: decreased activity tolerance, decreased knowledge of condition, difficulty walking, decreased ROM, decreased strength, impaired perceived functional ability, increased muscle spasms, improper body mechanics, postural dysfunction, and pain.    ACTIVITY LIMITATIONS: carrying, lifting, bending, standing, and sleeping   PERSONAL FACTORS: Age, Past/current  experiences, and Time since onset of injury/illness/exacerbation are also affecting patient's functional outcome.    REHAB POTENTIAL: Good   CLINICAL DECISION MAKING: Stable/uncomplicated   EVALUATION COMPLEXITY: Low     GOALS: Goals reviewed with patient? No   SHORT TERM GOALS: Target date: 12/02/2022     Patient to demonstrate independence in HEP Baseline: [id number] Goal status: INITIAL   2.  Increase forward flexion to 75% Baseline: 50% Goal status: INITIAL     LONG TERM GOALS: Target date: 12/23/2022   Decrease ODI score to 17/50 (34% perceived disability) Baseline: 25/50 Goal status: INITIAL   2.  Increase 30s chair stand test to 6 reps  arms crossed Baseline: 3 reps arms crossed Goal status: INITIAL   3.  Decrease pain to 8/10 Baseline: "20"/10 Goal status: INITIAL   4.  Increase all trunk ROM to 75% Baseline:  AROM eval  Flexion 50%  Extension 90%  Right lateral flexion 75%  Left lateral flexion 75%  Right rotation 50%  Left rotation 50%    Goal status: INITIAL   5.  Increase BLE strength to 4/5 Baseline:  MMT Right eval Left eval  Hip flexion 4- 4-  Hip extension 4- 4-  Hip abduction 4- 4-  Hip adduction      Hip internal rotation      Hip external rotation      Knee flexion 4- 4-  Knee extension 4- 4-  Ankle dorsiflexion      Ankle plantarflexion 4- 4-    Goal status: INITIAL       PLAN:   PT FREQUENCY: 2x/week   PT DURATION: 6 weeks   PLANNED INTERVENTIONS: Therapeutic exercises, Therapeutic activity, Neuromuscular re-education, Balance training, Gait training, Patient/Family education, Self Care, Joint mobilization, Dry Needling, Electrical stimulation, Spinal mobilization, Cryotherapy, Moist heat, Manual therapy, and Re-evaluation.   PLAN FOR NEXT SESSION: HEP review and update, trunk and core strengthening, ROM and flexibility exercises   Berta Minor, PTA 12/05/2022, 12:53 PM

## 2022-12-06 DIAGNOSIS — M545 Low back pain, unspecified: Secondary | ICD-10-CM | POA: Diagnosis not present

## 2022-12-09 ENCOUNTER — Ambulatory Visit: Payer: Medicaid Other

## 2022-12-09 ENCOUNTER — Telehealth: Payer: Self-pay

## 2022-12-09 NOTE — Telephone Encounter (Signed)
PT called and left voicemail regarding missed visit on 12/09/2022.   Left reminder of attendance policy and of next appointment.   Patient can only schedule one visit at a time moving forward.  Ward Chatters   12/09/22 4:35 PM

## 2022-12-12 ENCOUNTER — Ambulatory Visit: Payer: Medicaid Other

## 2022-12-16 ENCOUNTER — Ambulatory Visit: Payer: Medicaid Other

## 2022-12-18 ENCOUNTER — Telehealth: Payer: Medicaid Other | Admitting: Nurse Practitioner

## 2022-12-18 DIAGNOSIS — K0889 Other specified disorders of teeth and supporting structures: Secondary | ICD-10-CM

## 2022-12-18 MED ORDER — FLUCONAZOLE 150 MG PO TABS: 150.0000 mg | ORAL_TABLET | Freq: Once | ORAL | 0 refills | Status: AC

## 2022-12-18 MED ORDER — NAPROXEN 500 MG PO TABS: 500.0000 mg | ORAL_TABLET | Freq: Two times a day (BID) | ORAL | 1 refills | Status: DC

## 2022-12-18 MED ORDER — CLINDAMYCIN HCL 300 MG PO CAPS: 300.0000 mg | ORAL_CAPSULE | Freq: Three times a day (TID) | ORAL | 0 refills | Status: DC

## 2022-12-18 NOTE — Progress Notes (Signed)
Virtual Visit Consent   Lorraine Nguyen, you are scheduled for a virtual visit with Mary-Margaret Daphine Deutscher, FNP, a [facility] provider, today.     Just as with appointments in the office, your consent must be obtained to participate.  Your consent will be active for this visit and any virtual visit you may have with one of our providers in the next 365 days.     If you have a MyChart account, a copy of this consent can be sent to you electronically.  All virtual visits are billed to your insurance company just like a traditional visit in the office.    As this is a virtual visit, video technology does not allow for your provider to perform a traditional examination.  This may limit your provider's ability to fully assess your condition.  If your provider identifies any concerns that need to be evaluated in person or the need to arrange testing (such as labs, EKG, etc.), we will make arrangements to do so.     Although advances in technology are sophisticated, we cannot ensure that it will always work on either your end or our end.  If the connection with a video visit is poor, the visit may have to be switched to a telephone visit.  With either a video or telephone visit, we are not always able to ensure that we have a secure connection.     I need to obtain your verbal consent now.   Are you willing to proceed with your visit today? YES   Lorraine Nguyen has provided verbal consent on 12/18/2022 for a virtual visit (video or telephone).   Mary-Margaret Daphine Deutscher, FNP   Date: 12/18/2022 1:09 PM   Virtual Visit via Video Note   I, Mary-Margaret Daphine Deutscher, connected with Lorraine Nguyen ([id number], Aug 14, 1986) on 12/18/22 at  1:15 PM EDT by a video-enabled telemedicine application and verified that I am speaking with the correct person using two identifiers.  Location: Patient: Virtual Visit Location Patient: Home Provider: Virtual Visit Location Provider: Mobile   I discussed the  limitations of evaluation and management by telemedicine and the availability of in person appointments. The patient expressed understanding and agreed to proceed.    History of Present Illness: Lorraine Nguyen is a 37 y.o. who identifies as a female who was assigned female at birth, and is being seen today for toothache.  HPI: Was suppose to see a dentist  a month ago and her medicaid was messed up so she had to postppone appt until %/5/24.  Dental Pain  This is a new problem. The current episode started in the past 7 days. The problem occurs every few minutes. The problem has been waxing and waning. The pain is at a severity of 8/10. The pain is moderate. Associated symptoms include facial pain, a fever and sinus pressure. Pertinent negatives include no oral bleeding. She has tried NSAIDs for the symptoms. The treatment provided mild relief.    Review of Systems  Constitutional:  Positive for fever.  HENT:  Positive for sinus pressure.     Problems:  Patient Active Problem List   Diagnosis Date Noted   Chronic low back pain without sciatica 01/19/2022   Anxiety and depression 01/19/2022   Pain in joint of right ankle 06/22/2021   Gestational diabetes 10/09/2016    Allergies:  Allergies  Allergen Reactions   Vicodin [Hydrocodone-Acetaminophen] Itching   Medications:  Current Outpatient Medications:    fluconazole (DIFLUCAN) 150 MG  tablet, Take 1 tablet (150 mg total) by mouth every 3 (three) days as needed., Disp: 2 tablet, Rfl: 0   ibuprofen (ADVIL) 600 MG tablet, Take 1 tablet (600 mg total) by mouth every 6 (six) hours as needed., Disp: 30 tablet, Rfl: 0   methocarbamol (ROBAXIN) 500 MG tablet, Take 1 tablet (500 mg total) by mouth 2 (two) times daily., Disp: 20 tablet, Rfl: 0   Multiple Vitamin (MULTIVITAMIN) tablet, Take 1 tablet by mouth daily., Disp: , Rfl:   Current Facility-Administered Medications:    medroxyPROGESTERone (DEPO-PROVERA) injection 150 mg, 150 mg,  Intramuscular, Q90 days, Johnny Bridge, MD, 150 mg at 05/23/20 1516  Observations/Objective: Patient is well-developed, well-nourished in no acute distress.  Resting comfortably at home.  Head is normocephalic, atraumatic.  No labored breathing. Speech is clear and coherent with logical content.  Patient is alert and oriented at baseline.  Right jaw pain- no edema- unabke to see oral cavity on video  Assessment and Plan:  Lorraine Nguyen in today with chief complaint of Dental Pain   1. Pain, dental Gargle with warm salt water Keep appt with dentist Meds ordered this encounter  Medications   naproxen (NAPROSYN) 500 MG tablet    Sig: Take 1 tablet (500 mg total) by mouth 2 (two) times daily with a meal.    Dispense:  60 tablet    Refill:  1    Order Specific Question:   Supervising Provider    Answer:   Merrilee Jansky [[id number]]   clindamycin (CLEOCIN) 300 MG capsule    Sig: Take 1 capsule (300 mg total) by mouth 3 (three) times daily.    Dispense:  30 capsule    Refill:  0    Order Specific Question:   Supervising Provider    Answer:   Merrilee Jansky [[id number]]   fluconazole (DIFLUCAN) 150 MG tablet    Sig: Take 1 tablet (150 mg total) by mouth once for 1 dose.    Dispense:  1 tablet    Refill:  0    Order Specific Question:   Supervising Provider    Answer:   Merrilee Jansky [id number]     Follow Up Instructions: I discussed the assessment and treatment plan with the patient. The patient was provided an opportunity to ask questions and all were answered. The patient agreed with the plan and demonstrated an understanding of the instructions.  A copy of instructions were sent to the patient via MyChart.  The patient was advised to call back or seek an in-person evaluation if the symptoms worsen or if the condition fails to improve as anticipated.  Time:  I spent 12 minutes with the patient via telehealth technology discussing the above problems/concerns.     Mary-Margaret Daphine Deutscher, FNP

## 2022-12-18 NOTE — Patient Instructions (Addendum)
Lorraine Nguyen, thank you for joining Bennie Pierini, FNP for today's virtual visit.  While this provider is not your primary care provider (PCP), if your PCP is located in our provider database this encounter information will be shared with them immediately following your visit.   A [facility] MyChart account gives you access to today's visit and all your visits, tests, and labs performed at [facility] " click here if you don't have a [facility] MyChart account or go to [email or web address]  Consent: (Patient) Lorraine Nguyen provided verbal consent for this virtual visit at the beginning of the encounter.  Current Medications:  Current Outpatient Medications:    clindamycin (CLEOCIN) 300 MG capsule, Take 1 capsule (300 mg total) by mouth 3 (three) times daily., Disp: 30 capsule, Rfl: 0   fluconazole (DIFLUCAN) 150 MG tablet, Take 1 tablet (150 mg total) by mouth once for 1 dose., Disp: 1 tablet, Rfl: 0   naproxen (NAPROSYN) 500 MG tablet, Take 1 tablet (500 mg total) by mouth 2 (two) times daily with a meal., Disp: 60 tablet, Rfl: 1   ibuprofen (ADVIL) 600 MG tablet, Take 1 tablet (600 mg total) by mouth every 6 (six) hours as needed., Disp: 30 tablet, Rfl: 0   methocarbamol (ROBAXIN) 500 MG tablet, Take 1 tablet (500 mg total) by mouth 2 (two) times daily., Disp: 20 tablet, Rfl: 0   Multiple Vitamin (MULTIVITAMIN) tablet, Take 1 tablet by mouth daily., Disp: , Rfl:   Current Facility-Administered Medications:    medroxyPROGESTERone (DEPO-PROVERA) injection 150 mg, 150 mg, Intramuscular, Q90 days, Johnny Bridge, MD, 150 mg at 05/23/20 1516   Medications ordered in this encounter:  Meds ordered this encounter  Medications   naproxen (NAPROSYN) 500 MG tablet    Sig: Take 1 tablet (500 mg total) by mouth 2 (two) times daily with a meal.    Dispense:  60 tablet    Refill:  1    Order Specific Question:   Supervising Provider    Answer:   Merrilee Jansky [[id number]]   clindamycin (CLEOCIN) 300 MG capsule    Sig: Take 1 capsule (300 mg total) by mouth 3 (three) times daily.    Dispense:  30 capsule    Refill:  0    Order Specific Question:   Supervising Provider    Answer:   Merrilee Jansky [[id number]]   fluconazole (DIFLUCAN) 150 MG tablet    Sig: Take 1 tablet (150 mg total) by mouth once for 1 dose.    Dispense:  1 tablet    Refill:  0    Order Specific Question:   Supervising Provider    Answer:   Merrilee Jansky [id number]     *If you need refills on other medications prior to your next appointment, please contact your pharmacy*  Follow-Up: Call back or seek an in-person evaluation if the symptoms worsen or if the condition fails to improve as anticipated.  [facility] [phone]  Other Instructions Gargle with warm salt water See dentist ASAP   If you have been instructed to have an in-person evaluation today at a local Urgent Care facility, please use the link below. It will take you to a list of all of our available [facility], including address, phone number and hours of operation. Please do not delay care.  [facility]  If you or a family member do not have a primary care provider, use the  link below to schedule a visit and establish care. When you choose a [facility] primary care physician or advanced practice provider, you gain a long-term partner in health. Find a Primary Care Provider  Learn more about [facility]'s in-office and virtual care options: [facility]

## 2023-01-18 ENCOUNTER — Emergency Department (HOSPITAL_COMMUNITY)
Admission: EM | Admit: 2023-01-18 | Discharge: 2023-01-18 | Disposition: A | Discharge status: Home / Self Care | Payer: Medicaid Other | Attending: Emergency Medicine | Admitting: Emergency Medicine

## 2023-01-18 ENCOUNTER — Other Ambulatory Visit: Payer: Self-pay

## 2023-01-18 DIAGNOSIS — S00501A Unspecified superficial injury of lip, initial encounter: Secondary | ICD-10-CM | POA: Diagnosis present

## 2023-01-18 DIAGNOSIS — S00531A Contusion of lip, initial encounter: Secondary | ICD-10-CM | POA: Insufficient documentation

## 2023-01-18 MED ORDER — IBUPROFEN 800 MG PO TABS
800.0000 mg | ORAL_TABLET | Freq: Once | ORAL | Status: AC
  Administered 2023-01-18: 800 mg via ORAL
  Filled 2023-01-18: qty 1

## 2023-01-18 MED ORDER — ACETAMINOPHEN 500 MG PO TABS
1000.0000 mg | ORAL_TABLET | Freq: Once | ORAL | Status: AC
  Administered 2023-01-18: 1000 mg via ORAL
  Filled 2023-01-18: qty 2
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
## 2023-02-12 DIAGNOSIS — M545 Low back pain, unspecified: Secondary | ICD-10-CM | POA: Diagnosis not present

## 2023-03-26 DIAGNOSIS — Z111 Encounter for screening for respiratory tuberculosis: Secondary | ICD-10-CM | POA: Diagnosis not present

## 2023-07-29 ENCOUNTER — Telehealth: Payer: Medicaid Other | Admitting: Physician Assistant

## 2023-07-29 DIAGNOSIS — K047 Periapical abscess without sinus: Secondary | ICD-10-CM | POA: Diagnosis not present

## 2023-07-29 DIAGNOSIS — K0889 Other specified disorders of teeth and supporting structures: Secondary | ICD-10-CM | POA: Diagnosis not present

## 2023-07-29 DIAGNOSIS — B379 Candidiasis, unspecified: Secondary | ICD-10-CM

## 2023-07-29 DIAGNOSIS — T3695XA Adverse effect of unspecified systemic antibiotic, initial encounter: Secondary | ICD-10-CM | POA: Diagnosis not present

## 2023-07-29 MED ORDER — AMOXICILLIN 500 MG PO CAPS: 500.0000 mg | ORAL_CAPSULE | Freq: Three times a day (TID) | ORAL | 0 refills | Status: AC

## 2023-07-29 MED ORDER — FLUCONAZOLE 150 MG PO TABS: ORAL_TABLET | ORAL | 0 refills | Status: DC

## 2023-07-29 MED ORDER — IBUPROFEN 600 MG PO TABS: 600.0000 mg | ORAL_TABLET | Freq: Three times a day (TID) | ORAL | 0 refills | Status: DC | PRN

## 2023-07-29 NOTE — Addendum Note (Signed)
Addended by: [name] on: 07/29/2023 06:42 PM   Modules accepted: Orders

## 2023-07-29 NOTE — Patient Instructions (Addendum)
Lorraine Nguyen, thank you for joining [name], PA-C for today's virtual visit.  While this provider is not your primary care provider (PCP), if your PCP is located in our provider database this encounter information will be shared with them immediately following your visit.   A [facility] MyChart account gives you access to today's visit and all your visits, tests, and labs performed at [facility] " click here if you don't have a [facility] MyChart account or go to [email or web address]  Consent: (Patient) Lorraine Nguyen provided verbal consent for this virtual visit at the beginning of the encounter.  Current Medications:  Current Outpatient Medications:    amoxicillin (AMOXIL) 500 MG capsule, Take 1 capsule (500 mg total) by mouth 3 (three) times daily for 10 days., Disp: 30 capsule, Rfl: 0   ibuprofen (ADVIL) 600 MG tablet, Take 1 tablet (600 mg total) by mouth every 8 (eight) hours as needed., Disp: 30 tablet, Rfl: 0   clindamycin (CLEOCIN) 300 MG capsule, Take 1 capsule (300 mg total) by mouth 3 (three) times daily. (Patient not taking: Reported on 07/29/2023), Disp: 30 capsule, Rfl: 0   methocarbamol (ROBAXIN) 500 MG tablet, Take 1 tablet (500 mg total) by mouth 2 (two) times daily., Disp: 20 tablet, Rfl: 0   Multiple Vitamin (MULTIVITAMIN) tablet, Take 1 tablet by mouth daily., Disp: , Rfl:    naproxen (NAPROSYN) 500 MG tablet, Take 1 tablet (500 mg total) by mouth 2 (two) times daily with a meal., Disp: 60 tablet, Rfl: 1  Current Facility-Administered Medications:    medroxyPROGESTERone (DEPO-PROVERA) injection 150 mg, 150 mg, Intramuscular, Q90 days, Johnny Bridge, MD, 150 mg at 05/23/20 1516   Medications ordered in this encounter:  Meds ordered this encounter  Medications   amoxicillin (AMOXIL) 500 MG capsule    Sig: Take 1 capsule (500 mg total) by mouth 3 (three) times daily for 10 days.    Dispense:  30 capsule    Refill:  0    Order  Specific Question:   Supervising Provider    Answer:   Merrilee Jansky [id number]   ibuprofen (ADVIL) 600 MG tablet    Sig: Take 1 tablet (600 mg total) by mouth every 8 (eight) hours as needed.    Dispense:  30 tablet    Refill:  0    Order Specific Question:   Supervising Provider    Answer:   Merrilee Jansky [id number]     *If you need refills on other medications prior to your next appointment, please contact your pharmacy*  Follow-Up: Call back or seek an in-person evaluation if the symptoms worsen or if the condition fails to improve as anticipated.  [facility] [phone]  Other Instructions Apply cold compress to the left cheek for swelling.  Continue to watch for worsening symptoms Follow up with PCP or dentist if symptoms don't improve.   If you have been instructed to have an in-person evaluation today at a local Urgent Care facility, please use the link below. It will take you to a list of all of our available [facility], including address, phone number and hours of operation. Please do not delay care.  [facility]  If you or a family member do not have a primary care provider, use the link below to schedule a visit and establish care. When you choose a [facility] primary care physician or advanced practice provider, you gain a long-term partner  in health. Find a Primary Care Provider  Learn more about [facility]'s in-office and virtual care options: [facility] Now

## 2023-07-29 NOTE — Progress Notes (Addendum)
Virtual Visit Consent   Lorraine Nguyen, you are scheduled for a virtual visit with a [facility] provider today. Just as with appointments in the office, your consent must be obtained to participate. Your consent will be active for this visit and any virtual visit you may have with one of our providers in the next 365 days. If you have a MyChart account, a copy of this consent can be sent to you electronically.  As this is a virtual visit, video technology does not allow for your provider to perform a traditional examination. This may limit your provider's ability to fully assess your condition. If your provider identifies any concerns that need to be evaluated in person or the need to arrange testing (such as labs, EKG, etc.), we will make arrangements to do so. Although advances in technology are sophisticated, we cannot ensure that it will always work on either your end or our end. If the connection with a video visit is poor, the visit may have to be switched to a telephone visit. With either a video or telephone visit, we are not always able to ensure that we have a secure connection.  By engaging in this virtual visit, you consent to the provision of healthcare and authorize for your insurance to be billed (if applicable) for the services provided during this visit. Depending on your insurance coverage, you may receive a charge related to this service.  I need to obtain your verbal consent now. Are you willing to proceed with your visit today? Lorraine Nguyen has provided verbal consent on 07/29/2023 for a virtual visit (video or telephone). [name], [place]  Date: 07/29/2023 6:42 PM  Virtual Visit via Video Note   I, [name], connected with  Lorraine Nguyen  ([id number], 06-11-1986) on 07/29/23 at  4:45 PM EST by a video-enabled telemedicine application and verified that I am speaking with the correct person using two identifiers.  Location: Patient: Virtual Visit Location  Patient: Home Provider: Virtual Visit Location Provider: Home Office   I discussed the limitations of evaluation and management by telemedicine and the availability of in person appointments. The patient expressed understanding and agreed to proceed.    History of Present Illness: Lorraine Nguyen is a 37 y.o. who identifies as a female who was assigned female at birth, and is being seen today for tooth pain.  HPI: 37 y/o F presents for left bottom tooth x few days. No fever or chills, but difficult to chew. Has an appointment with dentist in December 2024.   Dental Pain     Problems:  Patient Active Problem List   Diagnosis Date Noted   Chronic low back pain without sciatica 01/19/2022   Anxiety and depression 01/19/2022   Pain in joint of right ankle 06/22/2021   Gestational diabetes 10/09/2016    Allergies:  Allergies  Allergen Reactions   Vicodin [Hydrocodone-Acetaminophen] Itching   Medications:  Current Outpatient Medications:    amoxicillin (AMOXIL) 500 MG capsule, Take 1 capsule (500 mg total) by mouth 3 (three) times daily for 10 days., Disp: 30 capsule, Rfl: 0   fluconazole (DIFLUCAN) 150 MG tablet, Take one tablet by mouth x 1 day. May repeat in 72 hours if symptoms don't improve., Disp: 2 tablet, Rfl: 0   ibuprofen (ADVIL) 600 MG tablet, Take 1 tablet (600 mg total) by mouth every 8 (eight) hours as needed., Disp: 30 tablet, Rfl: 0   clindamycin (CLEOCIN) 300 MG capsule, Take 1 capsule (300  mg total) by mouth 3 (three) times daily. (Patient not taking: Reported on 07/29/2023), Disp: 30 capsule, Rfl: 0   methocarbamol (ROBAXIN) 500 MG tablet, Take 1 tablet (500 mg total) by mouth 2 (two) times daily., Disp: 20 tablet, Rfl: 0   Multiple Vitamin (MULTIVITAMIN) tablet, Take 1 tablet by mouth daily., Disp: , Rfl:    naproxen (NAPROSYN) 500 MG tablet, Take 1 tablet (500 mg total) by mouth 2 (two) times daily with a meal., Disp: 60 tablet, Rfl: 1  Current  Facility-Administered Medications:    medroxyPROGESTERone (DEPO-PROVERA) injection 150 mg, 150 mg, Intramuscular, Q90 days, Johnny Bridge, MD, 150 mg at 05/23/20 1516  Observations/Objective: Patient is well-developed, well-nourished in no acute distress.  Resting comfortably  at home.  Head is normocephalic, atraumatic.  No labored breathing.  Speech is clear and coherent with logical content.  Patient is alert and oriented at baseline.    Assessment and Plan: 1. Dental abscess - amoxicillin (AMOXIL) 500 MG capsule; Take 1 capsule (500 mg total) by mouth 3 (three) times daily for 10 days.  Dispense: 30 capsule; Refill: 0  2. Pain, dental - ibuprofen (ADVIL) 600 MG tablet; Take 1 tablet (600 mg total) by mouth every 8 (eight) hours as needed.  Dispense: 30 tablet; Refill: 0  3. Antibiotic-induced yeast infection - fluconazole (DIFLUCAN) 150 MG tablet; Take one tablet by mouth x 1 day. May repeat in 72 hours if symptoms don't improve.  Dispense: 2 tablet; Refill: 0  Apply cold compress to the left cheek for swelling.  Continue to watch for worsening symptoms Follow up with PCP or dentist if symptoms don't improve. Pt verbalized understanding and in agreement.    Follow Up Instructions: I discussed the assessment and treatment plan with the patient. The patient was provided an opportunity to ask questions and all were answered. The patient agreed with the plan and demonstrated an understanding of the instructions.  A copy of instructions were sent to the patient via MyChart unless otherwise noted below.   Patient has requested to receive PHI (AVS, Work Notes, etc) pertaining to this video visit through e-mail as they are currently without active MyChart. They have voiced understand that email is not considered secure and their health information could be viewed by someone other than the patient.   The patient was advised to call back or seek an in-person evaluation if the symptoms  worsen or if the condition fails to improve as anticipated.    [name], PA-C

## 2023-10-16 ENCOUNTER — Telehealth: Payer: Medicaid Other | Admitting: Family Medicine

## 2023-10-16 NOTE — Progress Notes (Signed)
 The patient no-showed for appointment despite this provider sending direct link, reaching out via phone with no response and waiting for at least 10 minutes from appointment time for patient to join. They will be marked as a NS for this appointment/time.   Freddy Finner, NP

## 2023-11-10 ENCOUNTER — Inpatient Hospital Stay (HOSPITAL_COMMUNITY)
Admission: AD | Admit: 2023-11-10 | Discharge: 2023-11-10 | Disposition: A | Discharge status: Home / Self Care | Attending: Obstetrics and Gynecology | Admitting: Obstetrics and Gynecology

## 2023-11-10 ENCOUNTER — Encounter (HOSPITAL_COMMUNITY): Payer: Self-pay | Admitting: *Deleted

## 2023-11-10 DIAGNOSIS — Z3A01 Less than 8 weeks gestation of pregnancy: Secondary | ICD-10-CM | POA: Diagnosis not present

## 2023-11-10 DIAGNOSIS — R109 Unspecified abdominal pain: Secondary | ICD-10-CM | POA: Diagnosis present

## 2023-11-10 DIAGNOSIS — O209 Hemorrhage in early pregnancy, unspecified: Secondary | ICD-10-CM | POA: Insufficient documentation

## 2023-11-10 DIAGNOSIS — O99331 Smoking (tobacco) complicating pregnancy, first trimester: Secondary | ICD-10-CM | POA: Diagnosis not present

## 2023-11-10 DIAGNOSIS — F17210 Nicotine dependence, cigarettes, uncomplicated: Secondary | ICD-10-CM | POA: Diagnosis not present

## 2023-11-10 LAB — URINALYSIS, ROUTINE W REFLEX MICROSCOPIC
Bacteria, UA: NONE SEEN
Bilirubin Urine: NEGATIVE
Glucose, UA: NEGATIVE mg/dL
Hgb urine dipstick: NEGATIVE
Ketones, ur: NEGATIVE mg/dL
Nitrite: NEGATIVE
Protein, ur: NEGATIVE mg/dL
Specific Gravity, Urine: 1.006 (ref 1.005–1.030)
pH: 6 (ref 5.0–8.0)

## 2023-11-10 LAB — WET PREP, GENITAL
Clue Cells Wet Prep HPF POC: NONE SEEN
Sperm: NONE SEEN
Trich, Wet Prep: NONE SEEN
WBC, Wet Prep HPF POC: <10 (ref <10)
Yeast Wet Prep HPF POC: NONE SEEN

## 2023-11-10 LAB — POCT PREGNANCY, URINE: Preg Test, Ur: POSITIVE — AB

## 2023-11-10 MED ORDER — VITAFOL GUMMIES 3.33-0.333-34.8 MG PO CHEW: 1.0000 | CHEWABLE_TABLET | Freq: Every day | ORAL | 5 refills | Status: DC

## 2023-11-10 NOTE — Discharge Instructions (Signed)
 Your ultrasound shows a pregnancy in the correct place in your uterus. We discussed that vaginal bleeding in the first trimester is common, and that 80-90% of patients will go on to have a normal pregnancy with a live delivery. The remainder are at increased risk for miscarriage, unfortunately there are no known interventions to mitigate this risk. We discussed return precautions including crescendo abdominal pain, heavy vaginal bleeding soaking >1 pad/hour, and fever.  We will send a message to [facility] to schedule you a follow up ultrasound in 1-2 weeks.

## 2023-11-10 NOTE — MAU Provider Note (Signed)
 History     [id number]  Arrival date and time: 11/10/23 1853    Chief Complaint  Patient presents with   Abdominal Pain   Vaginal Bleeding     HPI Lorraine Nguyen is a 38 y.o. at [redacted]w[redacted]d by LMP, who presents for vaginal bleeding and cramping.   Patient reports bleeding today that was light, slightly more than spotting Also with some intermittent suprapubic cramping No burning or pain with urination No vaginal discharge No hx of ectopic pregnancy      OB History     Gravida  4   Para  3   Term  3   Preterm      AB      Living  3      SAB      IAB      Ectopic      Multiple  0   Live Births  3           Past Medical History:  Diagnosis Date   Elevated AFP 09/24/2016   Gestational diabetes    glyburide   Medical history non-contributory     Past Surgical History:  Procedure Laterality Date   NO PAST SURGERIES      Family History  Problem Relation Age of Onset   Diabetes Maternal Grandmother    Hypertension Maternal Grandmother    Diabetes Paternal Grandmother    Hypertension Paternal Grandfather    Healthy Mother    Healthy Father     Social History   Socioeconomic History   Marital status: Single    Spouse name: Not on file   Number of children: Not on file   Years of education: Not on file   Highest education level: Not on file  Occupational History   Occupation: Student    Comment: [employer]  Tobacco Use   Smoking status: Every Day    Current packs/day: 0.25    Types: Cigarettes   Smokeless tobacco: Never  Vaping Use   Vaping status: Never Used  Substance and Sexual Activity   Alcohol use: Yes    Comment: socially    Drug use: No   Sexual activity: Not Currently    Partners: Male  Other Topics Concern   Not on file  Social History Narrative   Not on file   Social Drivers of Health   Financial Resource Strain: Low Risk  (05/31/2021)   Overall Financial Resource Strain (CARDIA)    Difficulty of Paying Living  Expenses: Not hard at all  Food Insecurity: Food Insecurity Present (05/31/2021)   Hunger Vital Sign    Worried About Running Out of Food in the Last Year: Sometimes true    Ran Out of Food in the Last Year: Sometimes true  Transportation Needs: No Transportation Needs (05/31/2021)   PRAPARE - Administrator, Civil Service (Medical): No    Lack of Transportation (Non-Medical): No  Physical Activity: Sufficiently Active (05/31/2021)   Exercise Vital Sign    Days of Exercise per Week: 5 days    Minutes of Exercise per Session: 30 min  Stress: Stress Concern Present (05/31/2021)   [employer] of Occupational Health - Occupational Stress Questionnaire    Feeling of Stress : Rather much  Social Connections: Socially Isolated (05/31/2021)   Social Connection and Isolation Panel [NHANES]    Frequency of Communication with Friends and Family: More than three times a week    Frequency of Social Gatherings with Friends and Family: More  than three times a week    Attends Religious Services: Never    Active Member of Clubs or Organizations: No    Attends Banker Meetings: Never    Marital Status: Never married  Intimate Partner Violence: Not At Risk (05/31/2021)   Humiliation, Afraid, Rape, and Kick questionnaire    Fear of Current or Ex-Partner: No    Emotionally Abused: No    Physically Abused: No    Sexually Abused: No    Allergies  Allergen Reactions   Vicodin [Hydrocodone-Acetaminophen] Itching    No current facility-administered medications on file prior to encounter.   Current Outpatient Medications on File Prior to Encounter  Medication Sig Dispense Refill   clindamycin (CLEOCIN) 300 MG capsule Take 1 capsule (300 mg total) by mouth 3 (three) times daily. (Patient not taking: Reported on 07/29/2023) 30 capsule 0   fluconazole (DIFLUCAN) 150 MG tablet Take one tablet by mouth x 1 day. May repeat in 72 hours if symptoms don't improve. 2 tablet 0    ibuprofen (ADVIL) 600 MG tablet Take 1 tablet (600 mg total) by mouth every 8 (eight) hours as needed. 30 tablet 0   methocarbamol (ROBAXIN) 500 MG tablet Take 1 tablet (500 mg total) by mouth 2 (two) times daily. 20 tablet 0   Multiple Vitamin (MULTIVITAMIN) tablet Take 1 tablet by mouth daily.     naproxen (NAPROSYN) 500 MG tablet Take 1 tablet (500 mg total) by mouth 2 (two) times daily with a meal. 60 tablet 1     ROS Pertinent positives and negative per HPI, all others reviewed and negative  Physical Exam   BP (!) 144/86 (BP Location: Right Arm)   Pulse (!) 102   Temp 97.9 F (36.6 C) (Oral)   Resp 16   Ht 6\' 2"  (1.88 m)   Wt 110.7 kg   LMP 09/25/2023   BMI 31.33 kg/m   Patient Vitals for the past 24 hrs:  BP Temp Temp src Pulse Resp Height Weight  11/10/23 1931 (!) 144/86 97.9 F (36.6 C) Oral (!) 102 16 6\' 2"  (1.88 m) 110.7 kg    Physical Exam Vitals reviewed.  Constitutional:      General: She is not in acute distress.    Appearance: She is well-developed. She is not diaphoretic.  Eyes:     General: No scleral icterus. Pulmonary:     Effort: Pulmonary effort is normal. No respiratory distress.  Abdominal:     General: There is no distension.     Palpations: Abdomen is soft.     Tenderness: There is no abdominal tenderness. There is no guarding or rebound.  Skin:    General: Skin is warm and dry.  Neurological:     Mental Status: She is alert.     Coordination: Coordination normal.      Cervical Exam    Bedside Ultrasound Pt informed that the ultrasound is considered a limited OB ultrasound and is not intended to be a complete ultrasound exam.  Patient also informed that the ultrasound is not being completed with the intent of assessing for fetal or placental anomalies or any pelvic abnormalities.  Explained that the purpose of today's ultrasound is to assess for  viability.  Patient acknowledges the purpose of the exam and the limitations of the study.       My interpretation: First trimester findings: Intrauterine gestational sac seen: yes Gestational sac summary: likely yolk sac and fetal pole seen   Labs Results for  orders placed or performed during the hospital encounter of 11/10/23 (from the past 24 hours)  Urinalysis, Routine w reflex microscopic -Urine, Clean Catch     Status: Abnormal   Collection Time: 11/10/23  7:42 PM  Result Value Ref Range   Color, Urine STRAW (A) YELLOW   APPearance CLEAR CLEAR   Specific Gravity, Urine 1.006 1.005 - 1.030   pH 6.0 5.0 - 8.0   Glucose, UA NEGATIVE NEGATIVE mg/dL   Hgb urine dipstick NEGATIVE NEGATIVE   Bilirubin Urine NEGATIVE NEGATIVE   Ketones, ur NEGATIVE NEGATIVE mg/dL   Protein, ur NEGATIVE NEGATIVE mg/dL   Nitrite NEGATIVE NEGATIVE   Leukocytes,Ua TRACE (A) NEGATIVE   RBC / HPF 0-5 0 - 5 RBC/hpf   WBC, UA 0-5 0 - 5 WBC/hpf   Bacteria, UA NONE SEEN NONE SEEN   Squamous Epithelial / HPF 0-5 0 - 5 /HPF  Wet prep, genital     Status: None   Collection Time: 11/10/23  7:42 PM   Specimen: Urine, Clean Catch  Result Value Ref Range   Yeast Wet Prep HPF POC NONE SEEN NONE SEEN   Trich, Wet Prep NONE SEEN NONE SEEN   Clue Cells Wet Prep HPF POC NONE SEEN NONE SEEN   WBC, Wet Prep HPF POC <10 <10   Sperm NONE SEEN   Pregnancy, urine POC     Status: Abnormal   Collection Time: 11/10/23  7:42 PM  Result Value Ref Range   Preg Test, Ur POSITIVE (A) NEGATIVE    Imaging No results found.  MAU Course  Procedures Lab Orders         Wet prep, genital         Urinalysis, Routine w reflex microscopic -Urine, Clean Catch         Pregnancy, urine POC    Meds ordered this encounter  Medications   Prenatal Vit-Fe Phos-FA-Omega (VITAFOL GUMMIES) 3.33-0.333-34.8 MG CHEW    Sig: Chew 1 tablet by mouth daily.    Dispense:  90 tablet    Refill:  5   Imaging Orders  No imaging studies ordered today    MDM Moderate (Level 3-4)  Assessment and Plan  #Vaginal bleeding in  pregnancy, first trimester #[redacted] weeks gestation of pregnancy US shows IUP. We discussed that vaginal bleeding in the first trimester is common, and that 80-90% of patients will go on to have a normal pregnancy with a live delivery. The remainder are at increased risk for miscarriage, unfortunately there are no known interventions to mitigate this risk. Blood type A+, rhogam not indicated. We discussed return precautions including crescendo abdominal pain, heavy vaginal bleeding soaking >1 pad/hour, and fever.   Dispo: discharged to home in stable condition    Venora Maples, MD/MPH 11/10/23 8:38 PM  Allergies as of 11/10/2023       Reactions   Vicodin [hydrocodone-acetaminophen] Itching        Medication List     STOP taking these medications    clindamycin 300 MG capsule Commonly known as: Cleocin   fluconazole 150 MG tablet Commonly known as: DIFLUCAN   ibuprofen 600 MG tablet Commonly known as: ADVIL   methocarbamol 500 MG tablet Commonly known as: ROBAXIN   multivitamin tablet   naproxen 500 MG tablet Commonly known as: Naprosyn       TAKE these medications    Vitafol Gummies 3.33-0.333-34.8 MG Chew Chew 1 tablet by mouth daily.

## 2023-11-10 NOTE — MAU Note (Signed)
 Pt says she did HPT on 10-24-2023 - positive .  Not confirmed anywhere .  At work at 4 pm - started VB- on pad- doesn't know what color. In Triage - no pad - no blood in underwear.  Cramping - started at 5pm- 8/10- no meds for pain

## 2023-11-11 LAB — GC/CHLAMYDIA PROBE AMP (~~LOC~~) NOT AT ARMC
Chlamydia: NEGATIVE
Comment: NEGATIVE
Comment: NORMAL
Neisseria Gonorrhea: NEGATIVE

## 2023-11-17 ENCOUNTER — Telehealth: Payer: Self-pay | Admitting: Obstetrics and Gynecology

## 2023-11-17 NOTE — Telephone Encounter (Signed)
 Received message on 11/11/23 from Dr. Crissie Reese that patient needed a viability ultrasound in 1-2 weeks. Pt was scheduled for ultrasound on 11/27/23 @ 8:15 am talked to [facility] and she stated this dated should be fine.

## 2023-11-27 ENCOUNTER — Ambulatory Visit

## 2023-11-27 DIAGNOSIS — O3680X0 Pregnancy with inconclusive fetal viability, not applicable or unspecified: Secondary | ICD-10-CM

## 2023-11-27 DIAGNOSIS — O0990 Supervision of high risk pregnancy, unspecified, unspecified trimester: Secondary | ICD-10-CM

## 2023-11-30 ENCOUNTER — Telehealth: Admitting: Family

## 2023-11-30 DIAGNOSIS — L259 Unspecified contact dermatitis, unspecified cause: Secondary | ICD-10-CM

## 2023-11-30 MED ORDER — TRIAMCINOLONE ACETONIDE 0.5 % EX OINT: 1.0000 | TOPICAL_OINTMENT | Freq: Two times a day (BID) | CUTANEOUS | 0 refills | Status: DC

## 2023-11-30 NOTE — Progress Notes (Signed)
 Virtual Visit Consent   Lorraine Nguyen, you are scheduled for a virtual visit with a [facility] provider today. Just as with appointments in the office, your consent must be obtained to participate. Your consent will be active for this visit and any virtual visit you may have with one of our providers in the next 365 days. If you have a MyChart account, a copy of this consent can be sent to you electronically.  As this is a virtual visit, video technology does not allow for your provider to perform a traditional examination. This may limit your provider's ability to fully assess your condition. If your provider identifies any concerns that need to be evaluated in person or the need to arrange testing (such as labs, EKG, etc.), we will make arrangements to do so. Although advances in technology are sophisticated, we cannot ensure that it will always work on either your end or our end. If the connection with a video visit is poor, the visit may have to be switched to a telephone visit. With either a video or telephone visit, we are not always able to ensure that we have a secure connection.  By engaging in this virtual visit, you consent to the provision of healthcare and authorize for your insurance to be billed (if applicable) for the services provided during this visit. Depending on your insurance coverage, you may receive a charge related to this service.  I need to obtain your verbal consent now. Are you willing to proceed with your visit today? Lorraine Nguyen has provided verbal consent on 11/30/2023 for a virtual visit (video or telephone). Jannifer Rodney, FNP  Date: 11/30/2023 4:44 PM   Virtual Visit via Video Note   I, Jannifer Rodney, connected with  Lorraine Nguyen  ([id number], 1985-11-14) on 11/30/23 at  4:30 PM EDT by a video-enabled telemedicine application and verified that I am speaking with the correct person using two identifiers.  Location: Patient: Virtual Visit  Location Patient: Home Provider: Virtual Visit Location Provider: Home Office   I discussed the limitations of evaluation and management by telemedicine and the availability of in person appointments. The patient expressed understanding and agreed to proceed.    History of Present Illness: Lorraine Nguyen is a 38 y.o. who identifies as a female who was assigned female at birth, and is being seen today for rash on bilateral arms that started two weeks ago.  HPI: Rash This is a new problem. The current episode started 1 to 4 weeks ago. The problem is unchanged. The affected locations include the left arm and right arm. The rash is characterized by redness, itchiness and dryness. Pertinent negatives include no congestion, cough, diarrhea, fatigue, fever, joint pain, nail changes, rhinorrhea, shortness of breath, sore throat or vomiting. Past treatments include topical steroids. The treatment provided mild relief.    Problems:  Patient Active Problem List   Diagnosis Date Noted   Chronic low back pain without sciatica 01/19/2022   Anxiety and depression 01/19/2022   Pain in joint of right ankle 06/22/2021   Gestational diabetes 10/09/2016    Allergies:  Allergies  Allergen Reactions   Vicodin [Hydrocodone-Acetaminophen] Itching   Medications:  Current Outpatient Medications:    triamcinolone ointment (KENALOG) 0.5 %, Apply 1 Application topically 2 (two) times daily., Disp: 60 g, Rfl: 0   Prenatal Vit-Fe Phos-FA-Omega (VITAFOL GUMMIES) 3.33-0.333-34.8 MG CHEW, Chew 1 tablet by mouth daily., Disp: 90 tablet, Rfl: 5  Observations/Objective: Patient is well-developed, well-nourished in  no acute distress.  Resting comfortably  at home.  Head is normocephalic, atraumatic.  No labored breathing.  Speech is clear and coherent with logical content.  Patient is alert and oriented at baseline.   Assessment and Plan: 1. Contact dermatitis, unspecified contact dermatitis type, unspecified  trigger (Primary) - triamcinolone ointment (KENALOG) 0.5 %; Apply 1 Application topically 2 (two) times daily.  Dispense: 60 g; Refill: 0  Keep clean and dry Cool wash cloths  Follow up if symptoms worsen or do not improve   Follow Up Instructions: I discussed the assessment and treatment plan with the patient. The patient was provided an opportunity to ask questions and all were answered. The patient agreed with the plan and demonstrated an understanding of the instructions.  A copy of instructions were sent to the patient via MyChart unless otherwise noted below.     The patient was advised to call back or seek an in-person evaluation if the symptoms worsen or if the condition fails to improve as anticipated.    Jannifer Rodney, FNP

## 2023-12-03 ENCOUNTER — Inpatient Hospital Stay (HOSPITAL_COMMUNITY)

## 2023-12-03 ENCOUNTER — Inpatient Hospital Stay (HOSPITAL_COMMUNITY)
Admission: AD | Admit: 2023-12-03 | Discharge: 2023-12-03 | Disposition: A | Discharge status: Home / Self Care | Attending: Obstetrics & Gynecology | Admitting: Obstetrics & Gynecology

## 2023-12-03 ENCOUNTER — Encounter

## 2023-12-03 DIAGNOSIS — Z3687 Encounter for antenatal screening for uncertain dates: Secondary | ICD-10-CM | POA: Insufficient documentation

## 2023-12-03 DIAGNOSIS — O26891 Other specified pregnancy related conditions, first trimester: Secondary | ICD-10-CM | POA: Diagnosis present

## 2023-12-03 DIAGNOSIS — O09511 Supervision of elderly primigravida, first trimester: Secondary | ICD-10-CM | POA: Diagnosis not present

## 2023-12-03 DIAGNOSIS — O209 Hemorrhage in early pregnancy, unspecified: Secondary | ICD-10-CM | POA: Diagnosis not present

## 2023-12-03 DIAGNOSIS — O09521 Supervision of elderly multigravida, first trimester: Secondary | ICD-10-CM | POA: Insufficient documentation

## 2023-12-03 DIAGNOSIS — Z3A09 9 weeks gestation of pregnancy: Secondary | ICD-10-CM | POA: Insufficient documentation

## 2023-12-03 DIAGNOSIS — R109 Unspecified abdominal pain: Secondary | ICD-10-CM | POA: Diagnosis not present

## 2023-12-03 DIAGNOSIS — Z3A00 Weeks of gestation of pregnancy not specified: Secondary | ICD-10-CM | POA: Diagnosis not present

## 2023-12-03 LAB — CBC
HCT: 40.9 % (ref 36.0–46.0)
Hemoglobin: 12.7 g/dL (ref 12.0–15.0)
MCH: 27.1 pg (ref 26.0–34.0)
MCHC: 31.1 g/dL (ref 30.0–36.0)
MCV: 87.4 fL (ref 80.0–100.0)
Platelets: 319 10*3/uL (ref 150–400)
RBC: 4.68 MIL/uL (ref 3.87–5.11)
RDW: 13.5 % (ref 11.5–15.5)
WBC: 11.4 10*3/uL — ABNORMAL HIGH (ref 4.0–10.5)
nRBC: 0 % (ref 0.0–0.2)

## 2023-12-03 LAB — WET PREP, GENITAL
Clue Cells Wet Prep HPF POC: NONE SEEN
Sperm: NONE SEEN
Trich, Wet Prep: NONE SEEN
WBC, Wet Prep HPF POC: <10 (ref <10)
Yeast Wet Prep HPF POC: NONE SEEN

## 2023-12-03 LAB — GC/CHLAMYDIA PROBE AMP (~~LOC~~) NOT AT ARMC
Chlamydia: NEGATIVE
Comment: NEGATIVE
Comment: NORMAL
Neisseria Gonorrhea: NEGATIVE

## 2023-12-03 LAB — HIV ANTIBODY (ROUTINE TESTING W REFLEX): HIV Screen 4th Generation wRfx: NONREACTIVE

## 2023-12-03 LAB — HCG, QUANTITATIVE, PREGNANCY: hCG, Beta Chain, Quant, S: 5892 m[IU]/mL — ABNORMAL HIGH (ref <5)

## 2023-12-03 NOTE — MAU Note (Signed)
.  Lorraine Nguyen is a 38 y.o. at [redacted]w[redacted]d here in MAU reporting: Sunday night she started spotting. Has gotting a little heavier and has passed a few quarter sized clots.  Reports bleeding is moderate now on her pad. C/o cramping a swell.  LMP:  Onset of complaint: Sunday Pain score: 8 Vitals:   12/03/23 1408  BP: 132/86  Pulse: 90  Resp: 18  Temp: 98.7 F (37.1 C)     FHT: n/a  Lab orders placed from triage:

## 2023-12-03 NOTE — MAU Provider Note (Addendum)
 Chief Complaint:  Vaginal Bleeding and Abdominal Pain   HPI   Event Date/Time   First Provider Initiated Contact with Patient 12/03/23 1417      Lorraine Nguyen is a 38 y.o. [id number] at [redacted]w[redacted]d who presents to maternity admissions reporting vaginal bleeding and cramping.   Pt reports that she started having some bleeding at cramping yesterday. She was concerned that she had a miscarriage because noticed large blood clots yesterday. Since then she has filled 3-4 maxi pads. She is still complaining of bleeding and cramping. The blood is a dark red and the cramping she rates a 9/10. The pain has been constant and has not got any better or worse since starting. She has not hx of previous miscarriage. She was also previously examined at the MAU on 11/10/2023 for the same symptoms. Limited OB ultrasound at that time confirmed IUP with gestational sac and likely yolk sac and fetal pole. She was scheduled for ultrasound at [facility] 11/27/2023 but [place] was not completed.  Pregnancy Course:  No know complications  Past Medical History:  Diagnosis Date   Elevated AFP 09/24/2016   Gestational diabetes    glyburide   Medical history non-contributory    OB History  Gravida Para Term Preterm AB Living  4 3 3   3   SAB IAB Ectopic Multiple Live Births     0 3    # Outcome Date GA Lbr Len/2nd Weight Sex Type Anes PTL Lv  4 Current           3 Term 12/11/16 [redacted]w[redacted]d 05:02 / 00:01 3289 g M Vag-Spont None  LIV  2 Term 01/19/08 [redacted]w[redacted]d  3345 g M Vag-Spont   LIV  1 Term 06/05/06 [redacted]w[redacted]d  2920 g F Vag-Spont   LIV     Complications: Hypertension affecting pregnancy   Past Surgical History:  Procedure Laterality Date   NO PAST SURGERIES     Family History  Problem Relation Age of Onset   Diabetes Maternal Grandmother    Hypertension Maternal Grandmother    Diabetes Paternal Grandmother    Hypertension Paternal Grandfather    Healthy Mother    Healthy Father    Social History   Tobacco Use   Smoking status:  Every Day    Current packs/day: 0.25    Types: Cigarettes   Smokeless tobacco: Never  Vaping Use   Vaping status: Never Used  Substance Use Topics   Alcohol use: Yes    Comment: socially    Drug use: No   Allergies  Allergen Reactions   Vicodin [Hydrocodone-Acetaminophen] Itching   Medications Prior to Admission  Medication Sig Dispense Refill Last Dose/Taking   Prenatal Vit-Fe Phos-FA-Omega (VITAFOL GUMMIES) 3.33-0.333-34.8 MG CHEW Chew 1 tablet by mouth daily. 90 tablet 5    [Paused] triamcinolone ointment (KENALOG) 0.5 % Apply 1 Application topically 2 (two) times daily. 60 g 0     I have reviewed patient's Past Medical Hx, Surgical Hx, Family Hx, Social Hx, medications and allergies.   ROS  Pertinent items noted in HPI and remainder of comprehensive ROS otherwise negative.  Review of Systems  Constitutional:  Negative for chills and fever.  Cardiovascular:  Negative for chest pain, palpitations and leg swelling.  Gastrointestinal:  Negative for nausea and vomiting.  Genitourinary:  Negative for dysuria.  Neurological:  Negative for headaches.     PHYSICAL EXAM  Patient Vitals for the past 24 hrs:  BP Temp Pulse Resp Height Weight  12/03/23 1408 132/86  98.7 F (37.1 C) 90 18 6\' 2"  (1.88 m) 109.8 kg    Constitutional: Well-developed, well-nourished female in no acute distress.  Cardiovascular: normal rate & rhythm, warm and well-perfused Respiratory: normal effort, no problems with respiration noted GI: Abd soft, non-tender, non-distended MS: Extremities nontender, no edema, normal ROM Neurologic: Alert and oriented x 4.  GU: no CVA tenderness Pelvic: NEFG, physiologic discharge, no blood, cervix clean.       Labs: Results for orders placed or performed during the hospital encounter of 12/03/23 (from the past 24 hours)  CBC     Status: Abnormal   Collection Time: 12/03/23  2:55 PM  Result Value Ref Range   WBC 11.4 (H) 4.0 - 10.5 K/uL   RBC 4.68 3.87 - 5.11  MIL/uL   Hemoglobin 12.7 12.0 - 15.0 g/dL   HCT 72.5 36.6 - 44.0 %   MCV 87.4 80.0 - 100.0 fL   MCH 27.1 26.0 - 34.0 pg   MCHC 31.1 30.0 - 36.0 g/dL   RDW 34.7 42.5 - 95.6 %   Platelets 319 150 - 400 K/uL   nRBC 0.0 0.0 - 0.2 %  hCG, quantitative, pregnancy     Status: Abnormal   Collection Time: 12/03/23  2:55 PM  Result Value Ref Range   hCG, Beta Chain, Quant, S 5,892 (H) <5 mIU/mL  Wet prep, genital     Status: None   Collection Time: 12/03/23  3:24 PM  Result Value Ref Range   Yeast Wet Prep HPF POC NONE SEEN NONE SEEN   Trich, Wet Prep NONE SEEN NONE SEEN   Clue Cells Wet Prep HPF POC NONE SEEN NONE SEEN   WBC, Wet Prep HPF POC <10 <10   Sperm NONE SEEN     Imaging:  No results found.  MDM & MAU COURSE  MDM:  - [place] order to assess location of fetus - blood work done to assess for any infection  - hCG level assessed to see if levels have increased or are in the correct range - [place] with IUP, gestational sac and yolk sac present. - Wet prep negative. CBC wnl. - Awaiting beta hCG results. Consulted with Dr. Shawnie Pons, recommended follow up ultrasound in 1-2 weeks for threatened abortion.  MAU Course: Orders Placed This Encounter  Procedures   Wet prep, genital   US OB LESS THAN 14 WEEKS WITH OB TRANSVAGINAL   CBC   hCG, quantitative, pregnancy   HIV Antibody (routine testing w rflx)   Discharge patient   No orders of the defined types were placed in this encounter.   ASSESSMENT   1. Vaginal bleeding in pregnancy, first trimester   2. [redacted] weeks gestation of pregnancy     PLAN  Discharge home in stable condition with return precautions.   - Pt will follow up at [facility] appointment that she already has scheduled with Dr. Donavan Foil on 12/10/2023. Patient was not aware she had an appointment scheduled, appt date and time provided on AVS. Patient verbalized understanding of plan for OB visit and repeat US.  - Will have an ultrasound at that time to assess fetus, scheduled at  [facility] on 12/17/2023. - Pt was educated on when she should return to the MAU: severe abdominal pain, blood soaking through a pad every hour, fever.   Follow-up Information     Warden Fillers, MD Follow up.   Specialty: Obstetrics and Gynecology Why: as scheduled on 12/10/2023 Contact information: [address] [place] [address]  [phone]                 Allergies as of 12/03/2023       Reactions   Vicodin [hydrocodone-acetaminophen] Itching        Medication List     PAUSE taking these medications    triamcinolone ointment 0.5 % Wait to take this until your doctor or other care provider tells you to start again. Commonly known as: KENALOG Apply 1 Application topically 2 (two) times daily.       TAKE these medications    Vitafol Gummies 3.33-0.333-34.8 MG Chew Chew 1 tablet by mouth daily.

## 2023-12-03 NOTE — Discharge Instructions (Signed)
 Follow up with Dr. Donavan Foil as scheduled.  If he does not do an ultrasound while you are there that day, please go to the ultrasound appointment on April 9th at 8:15am at the [facility]. If this appointment time does not work for you, you can call them to reschedule at:  [facility] for [facility] [address] [place],  [place]  [address]  [phone]

## 2023-12-10 ENCOUNTER — Encounter: Admitting: Obstetrics and Gynecology

## 2023-12-11 ENCOUNTER — Encounter: Payer: Self-pay | Admitting: Physician Assistant

## 2023-12-11 ENCOUNTER — Telehealth: Admitting: Physician Assistant

## 2023-12-11 DIAGNOSIS — Z9109 Other allergy status, other than to drugs and biological substances: Secondary | ICD-10-CM | POA: Diagnosis not present

## 2023-12-11 MED ORDER — LORATADINE 10 MG PO TABS: 10.0000 mg | ORAL_TABLET | Freq: Every day | ORAL | 0 refills | Status: AC

## 2023-12-11 MED ORDER — FLUTICASONE PROPIONATE 50 MCG/ACT NA SUSP: 2.0000 | Freq: Every day | NASAL | 1 refills | Status: AC

## 2023-12-11 NOTE — Patient Instructions (Signed)
  [name], thank you for joining [name], PA-C for today's virtual visit.  While this provider is not your primary care provider (PCP), if your PCP is located in our provider database this encounter information will be shared with them immediately following your visit.   A [facility] MyChart account gives you access to today's visit and all your visits, tests, and labs performed at [facility] " click here if you don't have a [facility] MyChart account or go to [email or web address]  Consent: (Patient) [name] provided verbal consent for this virtual visit at the beginning of the encounter.  Current Medications:  Current Outpatient Medications:    fluticasone (FLONASE) 50 MCG/ACT nasal spray, Place 2 sprays into both nostrils daily., Disp: 16 g, Rfl: 1   loratadine (CLARITIN) 10 MG tablet, Take 1 tablet (10 mg total) by mouth daily., Disp: 30 tablet, Rfl: 0   Prenatal Vit-Fe Phos-FA-Omega (VITAFOL GUMMIES) 3.33-0.333-34.8 MG CHEW, Chew 1 tablet by mouth daily., Disp: 90 tablet, Rfl: 5   [Paused] triamcinolone ointment (KENALOG) 0.5 %, Apply 1 Application topically 2 (two) times daily., Disp: 60 g, Rfl: 0   Medications ordered in this encounter:  Meds ordered this encounter  Medications   fluticasone (FLONASE) 50 MCG/ACT nasal spray    Sig: Place 2 sprays into both nostrils daily.    Dispense:  16 g    Refill:  1    Supervising Provider:   Merrilee Jansky [[id number]]   loratadine (CLARITIN) 10 MG tablet    Sig: Take 1 tablet (10 mg total) by mouth daily.    Dispense:  30 tablet    Refill:  0    Supervising Provider:   Merrilee Jansky [[id number]]     *If you need refills on other medications prior to your next appointment, please contact your pharmacy*  Follow-Up: Call back or seek an in-person evaluation if the symptoms worsen or if the condition fails to improve as anticipated.  [facility] [phone]  Other  Instructions Stay well hydrated. Start medicines as prescribed. Continue to watch for worsening symptoms Schedule a virtual appointment if symptoms don't improve.    If you have been instructed to have an in-person evaluation today at a local Urgent Care facility, please use the link below. It will take you to a list of all of our available [facility], including address, phone number and hours of operation. Please do not delay care.  [facility]  If you or a family member do not have a primary care provider, use the link below to schedule a visit and establish care. When you choose a [facility] primary care physician or advanced practice provider, you gain a long-term partner in health. Find a Primary Care Provider  Learn more about [facility]'s in-office and virtual care options: [facility] - Get Care Now

## 2023-12-11 NOTE — Progress Notes (Addendum)
 Virtual Visit Consent   Lorraine Nguyen, you are scheduled for a virtual visit with a [facility] provider today. Just as with appointments in the office, your consent must be obtained to participate. Your consent will be active for this visit and any virtual visit you may have with one of our providers in the next 365 days. If you have a MyChart account, a copy of this consent can be sent to you electronically.  As this is a virtual visit, video technology does not allow for your provider to perform a traditional examination. This may limit your provider's ability to fully assess your condition. If your provider identifies any concerns that need to be evaluated in person or the need to arrange testing (such as labs, EKG, etc.), we will make arrangements to do so. Although advances in technology are sophisticated, we cannot ensure that it will always work on either your end or our end. If the connection with a video visit is poor, the visit may have to be switched to a telephone visit. With either a video or telephone visit, we are not always able to ensure that we have a secure connection.  By engaging in this virtual visit, you consent to the provision of healthcare and authorize for your insurance to be billed (if applicable) for the services provided during this visit. Depending on your insurance coverage, you may receive a charge related to this service.  I need to obtain your verbal consent now. Are you willing to proceed with your visit today? CATHERYN SLIFER has provided verbal consent on 12/11/2023 for a virtual visit (video or telephone). [name], [place]  Date: 12/11/2023 5:48 PM   Virtual Visit via Video Note   I, [name], connected with  JOLAN MEALOR  ([id number], Dec 11, 1985) on 12/11/23 at  5:15 PM EDT by a video-enabled telemedicine application and verified that I am speaking with the correct person using two identifiers.  Location: Patient: Virtual Visit Location  Patient: Home Provider: Virtual Visit Location Provider: Home Office   I discussed the limitations of evaluation and management by telemedicine and the availability of in person appointments. The patient expressed understanding and agreed to proceed.    History of Present Illness: SHANIKWA Nguyen is a 38 y.o. who identifies as a female who was assigned female at birth, and is being seen today for allergies.  HPI: 37y/o F with a recent history of miscarriage presents to video telehealth visit for c/o environmental allergies due to recent high pollen in the area. Complains of sinus pressure/headache, nasal congestion, itchy throat. Hasn't taken any OTC medicines.      Problems:  Patient Active Problem List   Diagnosis Date Noted   Chronic low back pain without sciatica 01/19/2022   Anxiety and depression 01/19/2022   Pain in joint of right ankle 06/22/2021   Gestational diabetes 10/09/2016    Allergies:  Allergies  Allergen Reactions   Vicodin [Hydrocodone-Acetaminophen] Itching   Medications:  Current Outpatient Medications:    fluticasone (FLONASE) 50 MCG/ACT nasal spray, Place 2 sprays into both nostrils daily., Disp: 16 g, Rfl: 1   loratadine (CLARITIN) 10 MG tablet, Take 1 tablet (10 mg total) by mouth daily., Disp: 30 tablet, Rfl: 0   Prenatal Vit-Fe Phos-FA-Omega (VITAFOL GUMMIES) 3.33-0.333-34.8 MG CHEW, Chew 1 tablet by mouth daily., Disp: 90 tablet, Rfl: 5   [Paused] triamcinolone ointment (KENALOG) 0.5 %, Apply 1 Application topically 2 (two) times daily., Disp: 60 g, Rfl: 0  Observations/Objective:  Patient is well-developed, well-nourished in no acute distress.  Resting comfortably  at home.  Head is normocephalic, atraumatic.  No labored breathing.  Speech is clear and coherent with logical content.  Patient is alert and oriented at baseline.    Assessment and Plan: 1. Environmental allergies (Primary) - fluticasone (FLONASE) 50 MCG/ACT nasal spray; Place 2  sprays into both nostrils daily.  Dispense: 16 g; Refill: 1 - loratadine (CLARITIN) 10 MG tablet; Take 1 tablet (10 mg total) by mouth daily.  Dispense: 30 tablet; Refill: 0  Stay well hydrated. Start medicines as prescribed. Continue to watch for worsening symptoms Schedule a virtual appointment if symptoms don't improve.   Pt verbalized understanding and in agreement.     Follow Up Instructions: I discussed the assessment and treatment plan with the patient. The patient was provided an opportunity to ask questions and all were answered. The patient agreed with the plan and demonstrated an understanding of the instructions.  A copy of instructions were sent to the patient via MyChart unless otherwise noted below.   Patient has requested to receive PHI (AVS, Work Notes, etc) pertaining to this video visit through e-mail as they are currently without active MyChart. They have voiced understand that email is not considered secure and their health information could be viewed by someone other than the patient.   The patient was advised to call back or seek an in-person evaluation if the symptoms worsen or if the condition fails to improve as anticipated.    [name], PA-C

## 2023-12-17 ENCOUNTER — Other Ambulatory Visit

## 2024-02-03 ENCOUNTER — Ambulatory Visit: Admitting: Certified Nurse Midwife

## 2024-02-03 NOTE — Progress Notes (Signed)
 Patient rescheduled appointment.  Raford Bunk, MSN, CNM, RNC-OB Certified Nurse Midwife, [facility] 02/03/2024 9:26 AM

## 2024-02-13 ENCOUNTER — Encounter: Admitting: Family Medicine

## 2024-03-09 ENCOUNTER — Ambulatory Visit: Admitting: Certified Nurse Midwife

## 2024-03-29 ENCOUNTER — Ambulatory Visit: Admitting: Family Medicine

## 2024-06-22 ENCOUNTER — Telehealth: Admitting: Family Medicine

## 2024-06-22 DIAGNOSIS — Z91199 Patient's noncompliance with other medical treatment and regimen due to unspecified reason: Secondary | ICD-10-CM

## 2024-06-22 NOTE — Progress Notes (Signed)
 The patient no-showed for appointment despite this provider sending direct link, reaching out via phone with no response and waiting for at least 10 minutes from appointment time for patient to join. They will be marked as a NS for this appointment/time.   Freddy Finner, NP

## 2024-06-23 ENCOUNTER — Telehealth

## 2024-07-07 ENCOUNTER — Telehealth: Admitting: Physician Assistant

## 2024-07-07 ENCOUNTER — Telehealth

## 2024-07-07 DIAGNOSIS — K047 Periapical abscess without sinus: Secondary | ICD-10-CM

## 2024-07-07 DIAGNOSIS — T3695XA Adverse effect of unspecified systemic antibiotic, initial encounter: Secondary | ICD-10-CM | POA: Diagnosis not present

## 2024-07-07 DIAGNOSIS — B379 Candidiasis, unspecified: Secondary | ICD-10-CM

## 2024-07-07 DIAGNOSIS — K0889 Other specified disorders of teeth and supporting structures: Secondary | ICD-10-CM | POA: Diagnosis not present

## 2024-07-07 MED ORDER — FLUCONAZOLE 150 MG PO TABS: 150.0000 mg | ORAL_TABLET | ORAL | 0 refills | Status: AC | PRN

## 2024-07-07 MED ORDER — IBUPROFEN 800 MG PO TABS: 800.0000 mg | ORAL_TABLET | Freq: Three times a day (TID) | ORAL | 0 refills | Status: AC | PRN

## 2024-07-07 MED ORDER — AMOXICILLIN-POT CLAVULANATE 875-125 MG PO TABS: 1.0000 | ORAL_TABLET | Freq: Two times a day (BID) | ORAL | 0 refills | Status: AC

## 2024-07-07 NOTE — Patient Instructions (Signed)
 Lorraine Nguyen, thank you for joining Delon CHRISTELLA Dickinson, PA-C for today's virtual visit.  While this provider is not your primary care provider (PCP), if your PCP is located in our provider database this encounter information will be shared with them immediately following your visit.   A [facility] MyChart account gives you access to today's visit and all your visits, tests, and labs performed at [facility]  click here if you don't have a [facility] MyChart account or go to [email or web address]  Consent: (Patient) Lorraine Nguyen provided verbal consent for this virtual visit at the beginning of the encounter.  Current Medications:  Current Outpatient Medications:    amoxicillin -clavulanate (AUGMENTIN ) 875-125 MG tablet, Take 1 tablet by mouth 2 (two) times daily., Disp: 14 tablet, Rfl: 0   fluconazole  (DIFLUCAN ) 150 MG tablet, Take 1 tablet (150 mg total) by mouth every 3 (three) days as needed., Disp: 2 tablet, Rfl: 0   ibuprofen  (ADVIL ) 800 MG tablet, Take 1 tablet (800 mg total) by mouth every 8 (eight) hours as needed., Disp: 30 tablet, Rfl: 0   fluticasone  (FLONASE ) 50 MCG/ACT nasal spray, Place 2 sprays into both nostrils daily., Disp: 16 g, Rfl: 1   loratadine  (CLARITIN ) 10 MG tablet, Take 1 tablet (10 mg total) by mouth daily., Disp: 30 tablet, Rfl: 0   Prenatal Vit-Fe Phos-FA-Omega (VITAFOL  GUMMIES) 3.33-0.333-34.8 MG CHEW, Chew 1 tablet by mouth daily., Disp: 90 tablet, Rfl: 5   [Paused] triamcinolone  ointment (KENALOG ) 0.5 %, Apply 1 Application topically 2 (two) times daily., Disp: 60 g, Rfl: 0   Medications ordered in this encounter:  Meds ordered this encounter  Medications   amoxicillin -clavulanate (AUGMENTIN ) 875-125 MG tablet    Sig: Take 1 tablet by mouth 2 (two) times daily.    Dispense:  14 tablet    Refill:  0    Supervising Provider:   LAMPTEY, PHILIP O [[id number]]   ibuprofen  (ADVIL ) 800 MG tablet    Sig: Take 1 tablet (800 mg total)  by mouth every 8 (eight) hours as needed.    Dispense:  30 tablet    Refill:  0    Supervising Provider:   LAMPTEY, PHILIP O [[id number]]   fluconazole  (DIFLUCAN ) 150 MG tablet    Sig: Take 1 tablet (150 mg total) by mouth every 3 (three) days as needed.    Dispense:  2 tablet    Refill:  0    Supervising Provider:   LAMPTEY, PHILIP O [[id number]]     *If you need refills on other medications prior to your next appointment, please contact your pharmacy*  Follow-Up: Call back or seek an in-person evaluation if the symptoms worsen or if the condition fails to improve as anticipated.  [facility] [phone]  Other Instructions Dental Abscess  A dental abscess is an infection around a tooth that may involve pain, swelling, and a collection of pus, as well as other symptoms. Treatment is important to help with symptoms and to prevent the infection from spreading. The general types of dental abscesses are: Pulpal abscess. This abscess may form from the inner part of the tooth (pulp). Periodontal abscess. This abscess may form from the gum. What are the causes? This condition is caused by a bacterial infection in or around the tooth. It may result from: Severe tooth decay (cavities). Trauma to the tooth, such as a broken or chipped tooth. What increases the risk? This condition is more likely to develop in males. It  is also more likely to develop in people who: Have cavities. Have severe gum disease. Eat sugary snacks between meals. Use tobacco products. Have diabetes. Have a weakened disease-fighting system (immune system). Do not brush and care for their teeth regularly. What are the signs or symptoms? Mild symptoms of this condition include: Tenderness. Bad breath. Fever. A bitter taste in the mouth. Pain in and around the infected tooth. Moderate symptoms of this condition include: Swollen neck glands. Chills. Pus drainage. Swelling and redness around the  infected tooth, in the mouth, or in the face. Severe pain in and around the infected tooth. Severe symptoms of this condition include: Difficulty swallowing. Difficulty opening the mouth. Nausea. Vomiting. How is this diagnosed? This condition is diagnosed based on: Your symptoms and your medical and dental history. An examination of the infected tooth. During the exam, your dental care provider may tap on the infected tooth. You may also need to have X-rays taken of the affected area. How is this treated? This condition is treated by getting rid of the infection. This may be done with: Antibiotic medicines. These may be used in certain situations. Antibacterial mouth rinse. Incision and drainage. This procedure is done by making an incision in the abscess to drain out the pus. Removing pus is the first priority in treating an abscess. A root canal. This may be performed to save the tooth. Your dental care provider accesses the visible part of your tooth (crown) with a drill and removes any infected pulp. Then the space is filled and sealed off. Tooth extraction. The tooth is pulled out if it cannot be saved by other treatment. You may also receive treatment for pain, such as: Acetaminophen  or NSAIDs. Gels that contain a numbing medicine. An injection to block the pain near your nerve. Follow these instructions at home: Medicines Take over-the-counter and prescription medicines only as told by your dental care provider. If you were prescribed an antibiotic, take it as told by your dental care provider. Do not stop taking the antibiotic even if you start to feel better. If you were prescribed a gel that contains a numbing medicine, use it exactly as told in the directions. Do not use these gels for children who are younger than 100 years of age. Use an antibacterial mouth rinse as told by your dental care provider. General instructions  Gargle with a mixture of salt and water 3-4 times a  day or as needed. To make salt water, completely dissolve -1 tsp (3-6 g) of salt in 1 cup (237 mL) of warm water. Eat a soft diet while your abscess is healing. Drink enough fluid to keep your urine pale yellow. Do not apply heat to the outside of your mouth. Do not use any products that contain nicotine or tobacco. These products include cigarettes, chewing tobacco, and vaping devices, such as e-cigarettes. If you need help quitting, ask your dental care provider. Keep all follow-up visits. This is important. How is this prevented?  Excellent dental home care, which includes brushing your teeth every morning and night with fluoride toothpaste. Floss one time each day. Get regularly scheduled dental cleanings. Consider having a dental sealant applied on teeth that have deep grooves to prevent cavities. Drink fluoridated water regularly. This includes most tap water. Check the label on bottled water to see if it contains fluoride. Reduce or eliminate sugary drinks. Eat healthy meals and snacks. Wear a mouth guard or face shield to protect your teeth while playing sports. Contact  a health care provider if: Your pain is worse and is not helped by medicine. You have swelling. You see pus around the tooth. You have a fever or chills. Get help right away if: Your symptoms suddenly get worse. You have a very bad headache. You have problems breathing or swallowing. You have trouble opening your mouth. You have swelling in your neck or around your eye. These symptoms may represent a serious problem that is an emergency. Do not wait to see if the symptoms will go away. Get medical help right away. Call your local emergency services (911 in the [place] Do not drive yourself to the hospital. Summary A dental abscess is a collection of pus in or around a tooth that results from an infection. A dental abscess may result from severe tooth decay, trauma to the tooth, or severe gum disease around a  tooth. Symptoms include severe pain, swelling, redness, and drainage of pus in and around the infected tooth. The first priority in treating a dental abscess is to drain out the pus. Treatment may also involve removing damage inside the tooth (root canal) or extracting the tooth. This information is not intended to replace advice given to you by your health care provider. Make sure you discuss any questions you have with your health care provider. Document Revised: 11/02/2020 Document Reviewed: 11/02/2020 Elsevier Patient Education  2024 Elsevier Inc.   If you have been instructed to have an in-person evaluation today at a local Urgent Care facility, please use the link below. It will take you to a list of all of our available [facility], including address, phone number and hours of operation. Please do not delay care.  [facility]  If you or a family member do not have a primary care provider, use the link below to schedule a visit and establish care. When you choose a [facility] primary care physician or advanced practice provider, you gain a long-term partner in health. Find a Primary Care Provider  Learn more about [facility]'s in-office and virtual care options: [facility] - Get Care Now

## 2024-07-07 NOTE — Progress Notes (Signed)
 Virtual Visit Consent   Lorraine Nguyen, you are scheduled for a virtual visit with a [facility] provider today. Just as with appointments in the office, your consent must be obtained to participate. Your consent will be active for this visit and any virtual visit you may have with one of our providers in the next 365 days. If you have a MyChart account, a copy of this consent can be sent to you electronically.  As this is a virtual visit, video technology does not allow for your provider to perform a traditional examination. This may limit your provider's ability to fully assess your condition. If your provider identifies any concerns that need to be evaluated in person or the need to arrange testing (such as labs, EKG, etc.), we will make arrangements to do so. Although advances in technology are sophisticated, we cannot ensure that it will always work on either your end or our end. If the connection with a video visit is poor, the visit may have to be switched to a telephone visit. With either a video or telephone visit, we are not always able to ensure that we have a secure connection.  By engaging in this virtual visit, you consent to the provision of healthcare and authorize for your insurance to be billed (if applicable) for the services provided during this visit. Depending on your insurance coverage, you may receive a charge related to this service.  I need to obtain your verbal consent now. Are you willing to proceed with your visit today? Lorraine Nguyen has provided verbal consent on 07/07/2024 for a virtual visit (video or telephone). Delon CHRISTELLA Dickinson, PA-C  Date: 07/07/2024 11:26 AM   Virtual Visit via Video Note   I, Delon CHRISTELLA Dickinson, connected with  Lorraine Nguyen  ([id number], [birth year]) on 07/07/24 at 11:15 AM EDT by a video-enabled telemedicine application and verified that I am speaking with the correct person using two identifiers.  Location: Patient:  Virtual Visit Location Patient: Mobile Provider: Virtual Visit Location Provider: Home Office   I discussed the limitations of evaluation and management by telemedicine and the availability of in person appointments. The patient expressed understanding and agreed to proceed.    History of Present Illness: Lorraine Nguyen is a 38 y.o. who identifies as a female who was assigned female at birth, and is being seen today for dental pain.  HPI: Dental Pain  This is a new problem. The current episode started in the past 7 days. The problem occurs constantly. The problem has been gradually worsening. The pain is moderate. Associated symptoms include facial pain, sinus pressure and thermal sensitivity. Pertinent negatives include no fever or oral bleeding. She has tried NSAIDs and acetaminophen  for the symptoms. The treatment provided no relief.    Dental appt on Jul 29, 2024  Problems:  Patient Active Problem List   Diagnosis Date Noted   Chronic low back pain without sciatica 01/19/2022   Anxiety and depression 01/19/2022   Pain in joint of right ankle 06/22/2021   Gestational diabetes 10/09/2016    Allergies:  Allergies  Allergen Reactions   Vicodin [Hydrocodone -Acetaminophen ] Itching   Medications:  Current Outpatient Medications:    amoxicillin -clavulanate (AUGMENTIN ) 875-125 MG tablet, Take 1 tablet by mouth 2 (two) times daily., Disp: 14 tablet, Rfl: 0   fluconazole  (DIFLUCAN ) 150 MG tablet, Take 1 tablet (150 mg total) by mouth every 3 (three) days as needed., Disp: 2 tablet, Rfl: 0   ibuprofen  (ADVIL ) 800 MG  tablet, Take 1 tablet (800 mg total) by mouth every 8 (eight) hours as needed., Disp: 30 tablet, Rfl: 0   fluticasone  (FLONASE ) 50 MCG/ACT nasal spray, Place 2 sprays into both nostrils daily., Disp: 16 g, Rfl: 1   loratadine  (CLARITIN ) 10 MG tablet, Take 1 tablet (10 mg total) by mouth daily., Disp: 30 tablet, Rfl: 0   Prenatal Vit-Fe Phos-FA-Omega (VITAFOL  GUMMIES)  3.33-0.333-34.8 MG CHEW, Chew 1 tablet by mouth daily., Disp: 90 tablet, Rfl: 5   [Paused] triamcinolone  ointment (KENALOG ) 0.5 %, Apply 1 Application topically 2 (two) times daily., Disp: 60 g, Rfl: 0  Observations/Objective: Patient is well-developed, well-nourished in no acute distress.  Resting comfortably  Head is normocephalic, atraumatic.  No labored breathing.  Speech is clear and coherent with logical content.  Patient is alert and oriented at baseline.    Assessment and Plan: 1. Pain, dental (Primary) - amoxicillin -clavulanate (AUGMENTIN ) 875-125 MG tablet; Take 1 tablet by mouth 2 (two) times daily.  Dispense: 14 tablet; Refill: 0 - ibuprofen  (ADVIL ) 800 MG tablet; Take 1 tablet (800 mg total) by mouth every 8 (eight) hours as needed.  Dispense: 30 tablet; Refill: 0  2. Dental abscess - amoxicillin -clavulanate (AUGMENTIN ) 875-125 MG tablet; Take 1 tablet by mouth 2 (two) times daily.  Dispense: 14 tablet; Refill: 0 - ibuprofen  (ADVIL ) 800 MG tablet; Take 1 tablet (800 mg total) by mouth every 8 (eight) hours as needed.  Dispense: 30 tablet; Refill: 0  3. Antibiotic-induced yeast infection - fluconazole  (DIFLUCAN ) 150 MG tablet; Take 1 tablet (150 mg total) by mouth every 3 (three) days as needed.  Dispense: 2 tablet; Refill: 0  - Suspected infection  - Augmentin  and ibuprofen  prescribed - Can use ice on outside jaw/cheek for swelling - Can also take tylenol  for pain with other medications - Clove oil or clove paste can be used for dental pain - Discussed DenTemp putty that can be used to cover a broken tooth - Diflucan  given as prophylaxis as patient tends to get vaginal yeast infections with antibiotic use. - Keep scheduled follow up appt with dentist - Seek in person evaluation if symptoms fail to improve or if they worsen   Follow Up Instructions: I discussed the assessment and treatment plan with the patient. The patient was provided an opportunity to ask questions  and all were answered. The patient agreed with the plan and demonstrated an understanding of the instructions.  A copy of instructions were sent to the patient via MyChart unless otherwise noted below.    The patient was advised to call back or seek an in-person evaluation if the symptoms worsen or if the condition fails to improve as anticipated.    Delon CHRISTELLA Dickinson, PA-C

## 2024-08-02 ENCOUNTER — Telehealth: Admitting: Physician Assistant

## 2024-08-02 NOTE — Progress Notes (Signed)
 The patient no-showed for appointment despite this provider sending direct link, reaching out via phone with no response and waiting for at least 10 minutes from appointment time for patient to join. They will be marked as a NS for this appointment/time.   Laure Kidney, PA-C

## 2024-08-26 ENCOUNTER — Encounter: Admitting: Family Medicine

## 2024-08-26 ENCOUNTER — Telehealth: Admitting: Physician Assistant

## 2024-08-26 DIAGNOSIS — R3989 Other symptoms and signs involving the genitourinary system: Secondary | ICD-10-CM

## 2024-08-26 MED ORDER — NITROFURANTOIN MONOHYD MACRO 100 MG PO CAPS: 100.0000 mg | ORAL_CAPSULE | Freq: Two times a day (BID) | ORAL | 0 refills | Status: AC

## 2024-08-26 NOTE — Progress Notes (Signed)
 Please complete and submit an e-Visit questionnaire for UTI symptoms instead of vaginal symptoms, to help us  clarify your symptoms and so we can make sure to get you treated properly. You will not be charged for this fpl group.

## 2024-08-26 NOTE — Progress Notes (Signed)

## 2024-09-22 ENCOUNTER — Telehealth: Admitting: Physician Assistant

## 2024-09-22 DIAGNOSIS — L232 Allergic contact dermatitis due to cosmetics: Secondary | ICD-10-CM

## 2024-09-22 MED ORDER — PREDNISONE 10 MG (21) PO TBPK: ORAL_TABLET | ORAL | 0 refills | Status: AC

## 2024-09-22 NOTE — Progress Notes (Signed)
 " Virtual Visit Consent   Lorraine Nguyen, you are scheduled for a virtual visit with a [facility] provider today. Just as with appointments in the office, your consent must be obtained to participate. Your consent will be active for this visit and any virtual visit you may have with one of our providers in the next 365 days. If you have a MyChart account, a copy of this consent can be sent to you electronically.  As this is a virtual visit, video technology does not allow for your provider to perform a traditional examination. This may limit your provider's ability to fully assess your condition. If your provider identifies any concerns that need to be evaluated in person or the need to arrange testing (such as labs, EKG, etc.), we will make arrangements to do so. Although advances in technology are sophisticated, we cannot ensure that it will always work on either your end or our end. If the connection with a video visit is poor, the visit may have to be switched to a telephone visit. With either a video or telephone visit, we are not always able to ensure that we have a secure connection.  By engaging in this virtual visit, you consent to the provision of healthcare and authorize for your insurance to be billed (if applicable) for the services provided during this visit. Depending on your insurance coverage, you may receive a charge related to this service.  I need to obtain your verbal consent now. Are you willing to proceed with your visit today? [name] has provided verbal consent on 09/22/2024 for a virtual visit (video or telephone). Delon CHRISTELLA Dickinson, PA-C  Date: 09/22/2024 3:34 PM   Virtual Visit via Video Note   I, Delon CHRISTELLA Dickinson, connected with  Lorraine Nguyen  ([id number], 1986-02-26) on 09/22/2024 at  3:15 PM EST by a video-enabled telemedicine application and verified that I am speaking with the correct person using two identifiers.  Location: Patient: Virtual  Visit Location Patient: Home Provider: Virtual Visit Location Provider: Home Office   I discussed the limitations of evaluation and management by telemedicine and the availability of in person appointments. The patient expressed understanding and agreed to proceed.    History of Present Illness: Lorraine Nguyen is a 39 y.o. who identifies as a female who was assigned female at birth, and is being seen today for rash.  HPI: Rash This is a new problem. The current episode started in the past 7 days. The problem has been gradually worsening since onset. The affected locations include the neck (noticed a rash in the right axilla that lasted 3-4 days then resolved; now with a pruritic rash on the right posterior neck where neck and shoulder meet). The rash is characterized by blistering, redness and itchiness. Associated with: possible new soap. Pertinent negatives include no anorexia, congestion, cough, facial edema, fatigue, fever or shortness of breath. Past treatments include nothing. The treatment provided no relief.     Problems:  Patient Active Problem List   Diagnosis Date Noted   Chronic low back pain without sciatica 01/19/2022   Anxiety and depression 01/19/2022   Pain in joint of right ankle 06/22/2021   Gestational diabetes 10/09/2016    Allergies: Allergies[1] Medications: Current Medications[2]  Observations/Objective: Patient is well-developed, well-nourished in no acute distress.  Resting comfortably at home.  Head is normocephalic, atraumatic.  No labored breathing.  Speech is clear and coherent with logical content.  Patient is alert and oriented at  baseline.  Papular and pustular rash on erythematous base located on the right posterior base of the neck  Assessment and Plan: 1. Allergic contact dermatitis due to cosmetics (Primary) - predniSONE  (STERAPRED UNI-PAK 21 TAB) 10 MG (21) TBPK tablet; 6 day taper; take as directed on package instructions  Dispense: 21  tablet; Refill: 0  - Possible new soap as trigger, but also question if may not be allergic but more a flexural eczema based off appearance and locations - Will treat with Prednisone  as above - Patient has Triamcinolone  cream on hand and advised she can use this twice daily on affected area on neck - Keep skin clean and dry - Moisturize well - Discussed using hypoallergenic skin cleansers/soaps like CeraVe, Aveeno - Avoid picking and scratching - Cold compresses - Luke warm to cool showers - Seek in person evaluation if worsening or fails to resolve  Follow Up Instructions: I discussed the assessment and treatment plan with the patient. The patient was provided an opportunity to ask questions and all were answered. The patient agreed with the plan and demonstrated an understanding of the instructions.  A copy of instructions were sent to the patient via MyChart unless otherwise noted below.    The patient was advised to call back or seek an in-person evaluation if the symptoms worsen or if the condition fails to improve as anticipated.    Delon CHRISTELLA Dickinson, PA-C     [1]  Allergies Allergen Reactions   Vicodin [Hydrocodone -Acetaminophen ] Itching  [2]  Current Outpatient Medications:    predniSONE  (STERAPRED UNI-PAK 21 TAB) 10 MG (21) TBPK tablet, 6 day taper; take as directed on package instructions, Disp: 21 tablet, Rfl: 0   amoxicillin -clavulanate (AUGMENTIN ) 875-125 MG tablet, Take 1 tablet by mouth 2 (two) times daily., Disp: 14 tablet, Rfl: 0   fluconazole  (DIFLUCAN ) 150 MG tablet, Take 1 tablet (150 mg total) by mouth every 3 (three) days as needed., Disp: 2 tablet, Rfl: 0   fluticasone  (FLONASE ) 50 MCG/ACT nasal spray, Place 2 sprays into both nostrils daily., Disp: 16 g, Rfl: 1   ibuprofen  (ADVIL ) 800 MG tablet, Take 1 tablet (800 mg total) by mouth every 8 (eight) hours as needed., Disp: 30 tablet, Rfl: 0   loratadine  (CLARITIN ) 10 MG tablet, Take 1 tablet (10 mg total)  by mouth daily., Disp: 30 tablet, Rfl: 0  "

## 2024-09-22 NOTE — Patient Instructions (Addendum)
 " Lorraine Nguyen, thank you for joining Delon CHRISTELLA Dickinson, PA-C for today's virtual visit.  While this provider is not your primary care provider (PCP), if your PCP is located in our provider database this encounter information will be shared with them immediately following your visit.   A [facility] MyChart account gives you access to today's visit and all your visits, tests, and labs performed at [facility]  click here if you don't have a [facility] MyChart account or go to [email or web address]  Consent: (Patient) Lorraine Nguyen provided verbal consent for this virtual visit at the beginning of the encounter.  Current Medications:  Current Outpatient Medications:    predniSONE  (STERAPRED UNI-PAK 21 TAB) 10 MG (21) TBPK tablet, 6 day taper; take as directed on package instructions, Disp: 21 tablet, Rfl: 0   amoxicillin -clavulanate (AUGMENTIN ) 875-125 MG tablet, Take 1 tablet by mouth 2 (two) times daily., Disp: 14 tablet, Rfl: 0   fluconazole  (DIFLUCAN ) 150 MG tablet, Take 1 tablet (150 mg total) by mouth every 3 (three) days as needed., Disp: 2 tablet, Rfl: 0   fluticasone  (FLONASE ) 50 MCG/ACT nasal spray, Place 2 sprays into both nostrils daily., Disp: 16 g, Rfl: 1   ibuprofen  (ADVIL ) 800 MG tablet, Take 1 tablet (800 mg total) by mouth every 8 (eight) hours as needed., Disp: 30 tablet, Rfl: 0   loratadine  (CLARITIN ) 10 MG tablet, Take 1 tablet (10 mg total) by mouth daily., Disp: 30 tablet, Rfl: 0   Medications ordered in this encounter:  Meds ordered this encounter  Medications   predniSONE  (STERAPRED UNI-PAK 21 TAB) 10 MG (21) TBPK tablet    Sig: 6 day taper; take as directed on package instructions    Dispense:  21 tablet    Refill:  0    Supervising Provider:   BLAISE ALEENE KIDD [[id number]]     *If you need refills on other medications prior to your next appointment, please contact your pharmacy*  Follow-Up: Call back or seek an in-person  evaluation if the symptoms worsen or if the condition fails to improve as anticipated.  [facility] [phone]  Other Instructions  Contact Dermatitis Dermatitis is redness, soreness, and swelling (inflammation) of the skin. Contact dermatitis is a reaction to certain substances that touch the skin. There are two types of this condition: Irritant contact dermatitis. This is the most common type. It happens when something irritates your skin, such as when your hands get dry from washing them too often with soap. You can get this type of reaction even if you have not been exposed to the irritant before. Allergic contact dermatitis. This type is caused by a substance that you are allergic to, such as poison ivy. It occurs when you have been exposed to the substance (allergen) and form a sensitivity to it. In some cases, the reaction may start soon after your first exposure to the allergen. In other cases, it may not start until you are exposed to the allergen again. It may then occur every time you are exposed to the allergen in the future. What are the causes? Irritant contact dermatitis is often caused by exposure to: Makeup. Soaps, detergents, and bleaches. Acids. Metal salts, such as nickel. Allergic contact dermatitis is often caused by exposure to: Poisonous plants. Chemicals. Jewelry. Latex. Medicines. Preservatives in products, such as clothes. What increases the risk? You are more likely to get this condition if you have: A job that exposes you to irritants or  allergens. Certain medical conditions. These include asthma and eczema. What are the signs or symptoms? Symptoms of this condition may occur in any place on your body that has been touched by the irritant. Symptoms include: Dryness, flaking, or cracking. Redness. Itching. Pain or a burning feeling. Blisters. Drainage of small amounts of blood or clear fluid from skin cracks. With allergic contact  dermatitis, there may also be swelling in areas such as the eyelids, mouth, or genitals. How is this diagnosed? This condition is diagnosed with a medical history and physical exam. A patch skin test may be done to help figure out the cause. If the condition is related to your job, you may need to see an expert in health problems in the workplace (occupational medicine specialist). How is this treated? This condition is treated by staying away from the cause of the reaction and protecting your skin from further contact. Treatment may also include: Steroid creams or ointments. Steroid medicines may need be taken by mouth (orally) in more severe cases. Antibiotics or medicines applied to the skin to kill bacteria (antibacterial ointments). These may be needed if a skin infection is present. Antihistamines. These may be taken orally or put on as a lotion to ease itching. A bandage (dressing). Follow these instructions at home: Skin care Moisturize your skin as needed. Put cool, wet cloths (cool compresses) on the affected areas. Try applying baking soda paste to your skin. Stir water into baking soda until it has the consistency of a paste. Do not scratch your skin. Avoid friction to the affected area. Avoid the use of soaps, perfumes, and dyes. Check the affected areas every day for signs of infection. Check for: More redness, swelling, or pain. More fluid or blood. Warmth. Pus or a bad smell. Medicines Take or apply over-the-counter and prescription medicines only as told by your health care provider. If you were prescribed antibiotics, take or apply them as told by your health care provider. Do not stop using the antibiotic even if you start to feel better. Bathing Try taking a bath with: Epsom salts. Follow the instructions on the packaging. You can get these at your local pharmacy or grocery store. Baking soda. Pour a small amount into the bath as told by your health care  provider. Colloidal oatmeal. Follow the instructions on the packaging. You can get this at your local pharmacy or grocery store. Bathe less often. This may mean bathing every other day. Bathe in lukewarm water. Avoid using hot water. Bandage care If you were given a dressing, change it as told by your health care provider. Wash your hands with soap and water for at least 20 seconds before and after you change your dressing. If soap and water are not available, use hand sanitizer. General instructions Avoid the substance that caused your reaction. If you do not know what caused it, keep a journal to try to track what caused it. Write down: What you eat and drink. What cosmetics you use. What you wear in the affected area. This includes jewelry. Contact a health care provider if: Your condition does not get better with treatment. Your condition gets worse. You have any signs of infection. You have a fever. You have new symptoms. Your bone or joint under the affected area becomes painful after the skin has healed. Get help right away if: You notice red streaks coming from the affected area. The affected area turns darker. You have trouble breathing. This information is not intended to replace  advice given to you by your health care provider. Make sure you discuss any questions you have with your health care provider. Document Revised: 03/01/2022 Document Reviewed: 03/01/2022 Elsevier Patient Education  2024 Elsevier Inc.  Itchy, Irritated Skin (Eczema): What to Know Eczema is a group of skin conditions that cause rough and inflamed skin. There are different types of eczema. They each have different triggers, symptoms, and treatments. Eczema is not contagious, so it doesn't spread from person to person. It can appear on different parts of your body at different times. It doesn't look the same on everyone. What are the causes? The exact cause of eczema isn't known. Things that can make it  worse includes: Irritants. Allergens. What are the signs or symptoms? Symptoms depend on the type of eczema you have. They can range from mild to very bad. Symptoms often include: Itchiness. Dry skin. Rash or skin bumps. Swelling. Crusty, flaky, or scaly patches. Thick patches of skin. Oozing skin or blisters. How is this diagnosed? This condition may be diagnosed based on: Symptoms. Physical exam. Medical history. Skin patch tests that use allergen patches on your back to check for allergic reactions. You may need to see a skin specialist called a dermatologist. This specialist can help diagnose and treat this condition. How is this treated? There's no cure for eczema, but you can manage your symptoms. Treatment depends on the type of eczema you have. Options may include: Medicines to lessen itching (antihistamines). Medicine to put on your skin to lessen swelling and irritation (corticosteroid creams or ointments). Light therapy, also called phototherapy. The affected skin is put under ultraviolet (UV) light. Medicines may be prescribed or purchased at the store. This will depend on the strength that's needed. Follow these instructions at home: Skin Care Use skin creams or lotions as told. Do not scratch your skin. This can make your rash worse. Keep fingernails short to avoid scratching open the skin. General instructions Take or apply your medicines as told. Avoid triggers and allergens. Treat symptoms fast if you have a flare-up. Keep all follow-up visits to make sure your treatment plan is working. Where to find more information American Academy of Dermatology: marketingsheets.si National Eczema Association: nationaleczema.org The Society for Pediatric Dermatology: pedsderm.net Contact a health care provider if: You have very bad itching even with treatment. You often scratch your skin until it bleeds. Your rash looks different than normal. You have a fever. You have symptoms  that don't go away with treatment. You have more redness, pain, or swelling over the affected skin. You have warmth or pus coming from the affected skin. This information is not intended to replace advice given to you by your health care provider. Make sure you discuss any questions you have with your health care provider. Document Revised: 01/28/2023 Document Reviewed: 01/28/2023 Elsevier Patient Education  2024 Elsevier Inc.   If you have been instructed to have an in-person evaluation today at a local Urgent Care facility, please use the link below. It will take you to a list of all of our available [facility], including address, phone number and hours of operation. Please do not delay care.  [facility]  If you or a family member do not have a primary care provider, use the link below to schedule a visit and establish care. When you choose a [facility] primary care physician or advanced practice provider, you gain a long-term partner in health. Find a Primary Care Provider  Learn  more about [facility]'s in-office and virtual care options: [facility] Get Care Now "

## 2024-09-29 ENCOUNTER — Ambulatory Visit
Admission: RE | Admit: 2024-09-29 | Discharge: 2024-09-29 | Disposition: A | Discharge status: Home / Self Care | Source: Ambulatory Visit

## 2024-09-29 ENCOUNTER — Encounter: Payer: Self-pay | Admitting: *Deleted

## 2024-09-29 VITALS — BP 144/87 | HR 93 | Temp 98.9°F | Resp 16

## 2024-09-29 DIAGNOSIS — R1024 Suprapubic pain: Secondary | ICD-10-CM | POA: Diagnosis not present

## 2024-09-29 LAB — POCT URINE DIPSTICK
Bilirubin, UA: NEGATIVE
Glucose, UA: NEGATIVE mg/dL
Ketones, POC UA: NEGATIVE mg/dL
Leukocytes, UA: NEGATIVE
Nitrite, UA: NEGATIVE
POC PROTEIN,UA: NEGATIVE
Spec Grav, UA: 1.015
Urobilinogen, UA: 1 U/dL
pH, UA: 6

## 2024-09-29 LAB — POCT URINE PREGNANCY: Preg Test, Ur: NEGATIVE

## 2024-09-29 MED ORDER — PHENAZOPYRIDINE HCL 200 MG PO TABS: 200.0000 mg | ORAL_TABLET | Freq: Three times a day (TID) | ORAL | 0 refills | Status: AC

## 2024-09-29 NOTE — Discharge Instructions (Signed)
" °  1. Suprapubic pain, acute (Primary) - POCT urine pregnancy completed in UC is negative for pregnancy - POCT URINE DIPSTICK completed today in UC shows trace blood, no leukocytes, no nitrite, no significant sign of urinary infection - Urine Culture collected today in UC and sent to lab for further testing results should be available in 2 to 3 days. - phenazopyridine  (PYRIDIUM ) 200 MG tablet; Take 1 tablet (200 mg total) by mouth 3 (three) times daily.  Dispense: 6 tablet; Refill: 0  -Continue to monitor symptoms for any change in severity if there is any escalation of current symptoms or development of new symptoms follow-up in ER for further evaluation and management. "

## 2024-09-29 NOTE — ED Triage Notes (Signed)
 Pt reports lower abdominal pain (points over bladder for location of pain) for 2 weeks. She took a home pregnancy test 3 days ago, ?positive. States the 2nd line was very faint. States she had a miscarriage in April last year. Denies n/v/d, denies vaginal discharge, denies UTI symptoms. Taking ibuprofen  for pain with some relief. Last dose on Friday.

## 2024-09-29 NOTE — ED Provider Notes (Signed)
 " [facility]  Note:  This document was prepared using Dragon voice recognition software and may include unintentional dictation errors.  MRN: [id number] DOB: 23-Aug-1986  Subjective:   Lorraine Nguyen is a 39 y.o. female presenting for suprapubic abdominal pressure x 2 weeks.  Patient reports that she took a pregnancy test about 3 days ago but would like urine pregnancy test performed today.  Patient states that she thought perhaps the home test was positive but this second line was extremely faint.  Patient denies any nausea/vomiting, diarrhea, vaginal discharge, other urinary symptoms such as dysuria or increased frequency.  Has been taking ibuprofen  for pain with minimal improvement.  Last dose of ibuprofen  that she took was on Friday.  Patient reports she did have a miscarriage in April 2025.  Current Medications[1]   Allergies[2]  Past Medical History:  Diagnosis Date   Elevated AFP 09/24/2016   Gestational diabetes    glyburide    Medical history non-contributory      Past Surgical History:  Procedure Laterality Date   NO PAST SURGERIES      Family History  Problem Relation Age of Onset   Diabetes Maternal Grandmother    Hypertension Maternal Grandmother    Diabetes Paternal Grandmother    Hypertension Paternal Grandfather    Healthy Mother    Healthy Father     Social History[3]  ROS Refer to HPI for ROS details.  Objective:   Vitals: BP (!) 144/87 (BP Location: Left Arm)   Pulse 93   Temp 98.9 F (37.2 C) (Oral)   Resp 16   LMP 09/05/2024   SpO2 98%   Breastfeeding No   Physical Exam Vitals and nursing note reviewed.  Constitutional:      General: She is not in acute distress.    Appearance: She is well-developed. She is not ill-appearing or toxic-appearing.  HENT:     Head: Normocephalic and atraumatic.     Mouth/Throat:     Mouth: Mucous membranes are moist.  Cardiovascular:     Rate and Rhythm: Normal rate.  Pulmonary:      Effort: Pulmonary effort is normal. No respiratory distress.  Abdominal:     Tenderness: There is abdominal tenderness in the suprapubic area. There is no right CVA tenderness or left CVA tenderness.  Skin:    General: Skin is warm and dry.  Neurological:     General: No focal deficit present.     Mental Status: She is alert and oriented to person, place, and time.  Psychiatric:        Mood and Affect: Mood normal.        Behavior: Behavior normal.     Procedures  Results for orders placed or performed during the hospital encounter of 09/29/24 (from the past 24 hours)  POCT urine pregnancy     Status: Normal   Collection Time: 09/29/24 11:37 AM  Result Value Ref Range   Preg Test, Ur Negative Negative  POCT URINE DIPSTICK     Status: Abnormal   Collection Time: 09/29/24 11:37 AM  Result Value Ref Range   Color, UA yellow yellow   Clarity, UA clear clear   Glucose, UA negative negative mg/dL   Bilirubin, UA negative negative   Ketones, POC UA negative negative mg/dL   Spec Grav, UA 8.984 8.989 - 1.025   Blood, UA trace-intact (A) negative   pH, UA 6.0 5.0 - 8.0   POC PROTEIN,UA negative negative, trace   Urobilinogen, UA 1.0  0.2 or 1.0 E.U./dL   Nitrite, UA Negative Negative   Leukocytes, UA Negative Negative    No results found.   Assessment and Plan :     Discharge Instructions       1. Suprapubic pain, acute (Primary) - POCT urine pregnancy completed in UC is negative for pregnancy - POCT URINE DIPSTICK completed today in UC shows trace blood, no leukocytes, no nitrite, no significant sign of urinary infection - Urine Culture collected today in UC and sent to lab for further testing results should be available in 2 to 3 days. - phenazopyridine  (PYRIDIUM ) 200 MG tablet; Take 1 tablet (200 mg total) by mouth 3 (three) times daily.  Dispense: 6 tablet; Refill: 0  -Continue to monitor symptoms for any change in severity if there is any escalation of current  symptoms or development of new symptoms follow-up in ER for further evaluation and management.       Lorraine Nguyen B Lorraine Nguyen    [1] No current facility-administered medications for this encounter.  Current Outpatient Medications:    ibuprofen  (ADVIL ) 800 MG tablet, Take 1 tablet (800 mg total) by mouth every 8 (eight) hours as needed., Disp: 30 tablet, Rfl: 0   phenazopyridine  (PYRIDIUM ) 200 MG tablet, Take 1 tablet (200 mg total) by mouth 3 (three) times daily., Disp: 6 tablet, Rfl: 0   amoxicillin -clavulanate (AUGMENTIN ) 875-125 MG tablet, Take 1 tablet by mouth 2 (two) times daily. (Patient not taking: Reported on 09/29/2024), Disp: 14 tablet, Rfl: 0   fluconazole  (DIFLUCAN ) 150 MG tablet, Take 1 tablet (150 mg total) by mouth every 3 (three) days as needed. (Patient not taking: Reported on 09/29/2024), Disp: 2 tablet, Rfl: 0   fluticasone  (FLONASE ) 50 MCG/ACT nasal spray, Place 2 sprays into both nostrils daily. (Patient not taking: Reported on 09/29/2024), Disp: 16 g, Rfl: 1   loratadine  (CLARITIN ) 10 MG tablet, Take 1 tablet (10 mg total) by mouth daily. (Patient not taking: Reported on 09/29/2024), Disp: 30 tablet, Rfl: 0   predniSONE  (STERAPRED UNI-PAK 21 TAB) 10 MG (21) TBPK tablet, 6 day taper; take as directed on package instructions (Patient not taking: Reported on 09/29/2024), Disp: 21 tablet, Rfl: 0 [2]  Allergies Allergen Reactions   Vicodin [Hydrocodone -Acetaminophen ] Itching  [3]  Social History Tobacco Use   Smoking status: Every Day    Current packs/day: 0.25    Types: Cigarettes   Smokeless tobacco: Never  Vaping Use   Vaping status: Never Used  Substance Use Topics   Alcohol use: Yes    Comment: socially    Drug use: No     Lorraine Nguyen B, NP 09/29/24 1213  "

## 2024-09-30 LAB — URINE CULTURE
Culture: <10000 — AB
Special Requests: NORMAL

## 2024-10-01 ENCOUNTER — Ambulatory Visit (HOSPITAL_COMMUNITY): Payer: Self-pay
